# Patient Record
Sex: Female | Born: 1980 | Race: Black or African American | Marital: Married | Smoking: Never smoker
Health system: Northeastern US, Academic
[De-identification: ages and names within clinical notes are randomized; demographics above are authoritative.]

## PROBLEM LIST (undated history)

## (undated) DIAGNOSIS — D573 Sickle-cell trait: Secondary | ICD-10-CM

## (undated) DIAGNOSIS — O1205 Gestational edema, complicating the puerperium: Secondary | ICD-10-CM

## (undated) DIAGNOSIS — O09529 Supervision of elderly multigravida, unspecified trimester: Secondary | ICD-10-CM

## (undated) DIAGNOSIS — Z8759 Personal history of other complications of pregnancy, childbirth and the puerperium: Secondary | ICD-10-CM

## (undated) DIAGNOSIS — K219 Gastro-esophageal reflux disease without esophagitis: Secondary | ICD-10-CM

## (undated) DIAGNOSIS — E669 Obesity, unspecified: Secondary | ICD-10-CM

## (undated) DIAGNOSIS — J45909 Unspecified asthma, uncomplicated: Secondary | ICD-10-CM

## (undated) DIAGNOSIS — R7303 Prediabetes: Secondary | ICD-10-CM

## (undated) DIAGNOSIS — D649 Anemia, unspecified: Secondary | ICD-10-CM

## (undated) HISTORY — DX: Prediabetes: R73.03

## (undated) HISTORY — DX: Obesity, unspecified: E66.9

## (undated) HISTORY — PX: OTHER SURGICAL HISTORY: SHX169

## (undated) HISTORY — DX: Anemia, unspecified: D64.9

## (undated) HISTORY — DX: Sickle-cell trait: D57.3

## (undated) HISTORY — DX: Unspecified asthma, uncomplicated: J45.909

---

## 2014-12-15 ENCOUNTER — Other Ambulatory Visit (HOSPITAL_COMMUNITY): Payer: Self-pay | Admitting: Obstetrics

## 2014-12-15 DIAGNOSIS — N979 Female infertility, unspecified: Secondary | ICD-10-CM

## 2014-12-17 ENCOUNTER — Ambulatory Visit (HOSPITAL_COMMUNITY)
Admission: RE | Admit: 2014-12-17 | Discharge: 2014-12-17 | Disposition: A | Discharge status: Home / Self Care | Payer: BLUE CROSS/BLUE SHIELD | Source: Ambulatory Visit | Attending: Obstetrics | Admitting: Obstetrics

## 2014-12-17 DIAGNOSIS — N979 Female infertility, unspecified: Secondary | ICD-10-CM | POA: Insufficient documentation

## 2014-12-17 MED ORDER — IOHEXOL 300 MG/ML  SOLN
20.0000 mL | Freq: Once | INTRAMUSCULAR | Status: AC | PRN
  Administered 2014-12-17: 20 mL

## 2015-12-21 LAB — OB RESULTS CONSOLE GC/CHLAMYDIA
Chlamydia: NEGATIVE
GC PROBE AMP, GENITAL: NEGATIVE

## 2015-12-21 LAB — OB RESULTS CONSOLE RUBELLA ANTIBODY, IGM: RUBELLA: IMMUNE

## 2015-12-21 LAB — OB RESULTS CONSOLE RPR: RPR: NONREACTIVE

## 2015-12-21 LAB — OB RESULTS CONSOLE ABO/RH: RH TYPE: POSITIVE

## 2015-12-21 LAB — OB RESULTS CONSOLE HIV ANTIBODY (ROUTINE TESTING): HIV: NONREACTIVE

## 2015-12-21 LAB — OB RESULTS CONSOLE ANTIBODY SCREEN: ANTIBODY SCREEN: NEGATIVE

## 2015-12-21 LAB — OB RESULTS CONSOLE HEPATITIS B SURFACE ANTIGEN: HEP B S AG: NEGATIVE

## 2016-02-01 ENCOUNTER — Inpatient Hospital Stay (HOSPITAL_COMMUNITY)
Admission: AD | Admit: 2016-02-01 | Payer: BLUE CROSS/BLUE SHIELD | Source: Ambulatory Visit | Admitting: Obstetrics and Gynecology

## 2016-06-05 ENCOUNTER — Other Ambulatory Visit: Payer: Self-pay | Admitting: Obstetrics

## 2016-06-05 DIAGNOSIS — O26613 Liver and biliary tract disorders in pregnancy, third trimester: Principal | ICD-10-CM

## 2016-06-05 DIAGNOSIS — K831 Obstruction of bile duct: Secondary | ICD-10-CM

## 2016-06-06 ENCOUNTER — Encounter (HOSPITAL_COMMUNITY)
Admission: RE | Disposition: A | Discharge status: Home / Self Care | Payer: Self-pay | Source: Ambulatory Visit | Attending: Obstetrics

## 2016-06-06 ENCOUNTER — Inpatient Hospital Stay (HOSPITAL_COMMUNITY)
Admission: RE | Admit: 2016-06-06 | Discharge: 2016-06-10 | DRG: 765 | Disposition: A | Discharge status: Home / Self Care | Payer: BLUE CROSS/BLUE SHIELD | Source: Ambulatory Visit | Attending: Obstetrics | Admitting: Obstetrics

## 2016-06-06 ENCOUNTER — Inpatient Hospital Stay (HOSPITAL_COMMUNITY): Payer: BLUE CROSS/BLUE SHIELD | Admitting: Anesthesiology

## 2016-06-06 ENCOUNTER — Encounter (HOSPITAL_COMMUNITY): Payer: Self-pay

## 2016-06-06 DIAGNOSIS — O26613 Liver and biliary tract disorders in pregnancy, third trimester: Secondary | ICD-10-CM

## 2016-06-06 DIAGNOSIS — O1002 Pre-existing essential hypertension complicating childbirth: Secondary | ICD-10-CM | POA: Diagnosis present

## 2016-06-06 DIAGNOSIS — E669 Obesity, unspecified: Secondary | ICD-10-CM | POA: Diagnosis present

## 2016-06-06 DIAGNOSIS — O9081 Anemia of the puerperium: Secondary | ICD-10-CM | POA: Diagnosis not present

## 2016-06-06 DIAGNOSIS — D62 Acute posthemorrhagic anemia: Secondary | ICD-10-CM | POA: Diagnosis not present

## 2016-06-06 DIAGNOSIS — D573 Sickle-cell trait: Secondary | ICD-10-CM | POA: Diagnosis present

## 2016-06-06 DIAGNOSIS — O99214 Obesity complicating childbirth: Secondary | ICD-10-CM | POA: Diagnosis present

## 2016-06-06 DIAGNOSIS — O26643 Intrahepatic cholestasis of pregnancy, third trimester: Secondary | ICD-10-CM | POA: Diagnosis present

## 2016-06-06 DIAGNOSIS — Z6838 Body mass index (BMI) 38.0-38.9, adult: Secondary | ICD-10-CM | POA: Diagnosis not present

## 2016-06-06 DIAGNOSIS — Z3490 Encounter for supervision of normal pregnancy, unspecified, unspecified trimester: Secondary | ICD-10-CM

## 2016-06-06 DIAGNOSIS — O324XX0 Maternal care for high head at term, not applicable or unspecified: Secondary | ICD-10-CM | POA: Diagnosis present

## 2016-06-06 DIAGNOSIS — Z3A36 36 weeks gestation of pregnancy: Secondary | ICD-10-CM | POA: Diagnosis not present

## 2016-06-06 DIAGNOSIS — O1205 Gestational edema, complicating the puerperium: Secondary | ICD-10-CM | POA: Diagnosis present

## 2016-06-06 DIAGNOSIS — K831 Obstruction of bile duct: Secondary | ICD-10-CM | POA: Diagnosis present

## 2016-06-06 DIAGNOSIS — O2662 Liver and biliary tract disorders in childbirth: Secondary | ICD-10-CM | POA: Diagnosis present

## 2016-06-06 HISTORY — DX: Gestational edema, complicating the puerperium: O12.05

## 2016-06-06 HISTORY — DX: Sickle-cell trait: D57.3

## 2016-06-06 LAB — CBC
HEMATOCRIT: 29.6 % — AB (ref 36.0–46.0)
HEMOGLOBIN: 10.5 g/dL — AB (ref 12.0–15.0)
MCH: 27.3 pg (ref 26.0–34.0)
MCHC: 35.5 g/dL (ref 30.0–36.0)
MCV: 77.1 fL — AB (ref 78.0–100.0)
Platelets: 320 10*3/uL (ref 150–400)
RBC: 3.84 MIL/uL — ABNORMAL LOW (ref 3.87–5.11)
RDW: 14.4 % (ref 11.5–15.5)
WBC: 8.3 10*3/uL (ref 4.0–10.5)

## 2016-06-06 LAB — TYPE AND SCREEN
ABO/RH(D): O POS
ANTIBODY SCREEN: NEGATIVE

## 2016-06-06 LAB — ABO/RH: ABO/RH(D): O POS

## 2016-06-06 LAB — RPR: RPR Ser Ql: NONREACTIVE

## 2016-06-06 SURGERY — Surgical Case
Anesthesia: Epidural

## 2016-06-06 MED ORDER — FENTANYL 2.5 MCG/ML BUPIVACAINE 1/10 % EPIDURAL INFUSION (WH - ANES)
14.0000 mL/h | INTRAMUSCULAR | Status: DC | PRN
  Administered 2016-06-06 (×3): 14 mL/h via EPIDURAL
  Filled 2016-06-06: qty 125

## 2016-06-06 MED ORDER — FENTANYL CITRATE (PF) 100 MCG/2ML IJ SOLN
INTRAMUSCULAR | Status: AC
  Filled 2016-06-06: qty 2

## 2016-06-06 MED ORDER — LIDOCAINE HCL (PF) 1 % IJ SOLN
30.0000 mL | INTRAMUSCULAR | Status: DC | PRN
  Filled 2016-06-06: qty 30

## 2016-06-06 MED ORDER — OXYCODONE-ACETAMINOPHEN 5-325 MG PO TABS: 2.0000 | ORAL_TABLET | ORAL | Status: DC | PRN

## 2016-06-06 MED ORDER — LACTATED RINGERS IV SOLN
INTRAVENOUS | Status: DC
  Administered 2016-06-06: 07:00:00 via INTRAVENOUS
  Administered 2016-06-06: 1000 mL via INTRAVENOUS
  Administered 2016-06-06 (×2): via INTRAVENOUS

## 2016-06-06 MED ORDER — CEFAZOLIN SODIUM-DEXTROSE 2-4 GM/100ML-% IV SOLN
2.0000 g | Freq: Once | INTRAVENOUS | Status: AC
  Administered 2016-06-06: 2 g via INTRAVENOUS

## 2016-06-06 MED ORDER — OXYTOCIN 40 UNITS IN LACTATED RINGERS INFUSION - SIMPLE MED
1.0000 m[IU]/min | INTRAVENOUS | Status: DC
  Administered 2016-06-06: 2 m[IU]/min via INTRAVENOUS
  Administered 2016-06-06: 6 m[IU]/min via INTRAVENOUS
  Administered 2016-06-06: 8 m[IU]/min via INTRAVENOUS
  Administered 2016-06-06: 4 m[IU]/min via INTRAVENOUS
  Filled 2016-06-06: qty 1000

## 2016-06-06 MED ORDER — EPHEDRINE 5 MG/ML INJ: 10.0000 mg | INTRAVENOUS | Status: DC | PRN

## 2016-06-06 MED ORDER — SODIUM BICARBONATE 8.4 % IV SOLN
INTRAVENOUS | Status: DC | PRN
  Administered 2016-06-06: 10 mL via EPIDURAL

## 2016-06-06 MED ORDER — OXYTOCIN 10 UNIT/ML IJ SOLN
INTRAMUSCULAR | Status: AC
  Filled 2016-06-06: qty 4

## 2016-06-06 MED ORDER — CEFAZOLIN SODIUM-DEXTROSE 2-4 GM/100ML-% IV SOLN
INTRAVENOUS | Status: AC
  Filled 2016-06-06: qty 100

## 2016-06-06 MED ORDER — PHENYLEPHRINE 40 MCG/ML (10ML) SYRINGE FOR IV PUSH (FOR BLOOD PRESSURE SUPPORT): 80.0000 ug | PREFILLED_SYRINGE | INTRAVENOUS | Status: DC | PRN

## 2016-06-06 MED ORDER — DIPHENHYDRAMINE HCL 50 MG/ML IJ SOLN: 12.5000 mg | INTRAMUSCULAR | Status: DC | PRN

## 2016-06-06 MED ORDER — LIDOCAINE HCL (PF) 1 % IJ SOLN
INTRAMUSCULAR | Status: DC | PRN
  Administered 2016-06-06 (×2): 4 mL

## 2016-06-06 MED ORDER — OXYTOCIN 40 UNITS IN LACTATED RINGERS INFUSION - SIMPLE MED: 2.5000 [IU]/h | INTRAVENOUS | Status: DC

## 2016-06-06 MED ORDER — SCOPOLAMINE 1 MG/3DAYS TD PT72
MEDICATED_PATCH | TRANSDERMAL | Status: AC
  Filled 2016-06-06: qty 1

## 2016-06-06 MED ORDER — OXYCODONE-ACETAMINOPHEN 5-325 MG PO TABS: 1.0000 | ORAL_TABLET | ORAL | Status: DC | PRN

## 2016-06-06 MED ORDER — ACETAMINOPHEN 325 MG PO TABS: 650.0000 mg | ORAL_TABLET | ORAL | Status: DC | PRN

## 2016-06-06 MED ORDER — MORPHINE SULFATE (PF) 0.5 MG/ML IJ SOLN
INTRAMUSCULAR | Status: AC
  Filled 2016-06-06: qty 10

## 2016-06-06 MED ORDER — LACTATED RINGERS IV SOLN: 500.0000 mL | Freq: Once | INTRAVENOUS | Status: DC

## 2016-06-06 MED ORDER — MISOPROSTOL 25 MCG QUARTER TABLET
25.0000 ug | ORAL_TABLET | ORAL | Status: DC | PRN
  Administered 2016-06-06 (×2): 25 ug via VAGINAL
  Filled 2016-06-06 (×2): qty 0.25

## 2016-06-06 MED ORDER — PHENYLEPHRINE 40 MCG/ML (10ML) SYRINGE FOR IV PUSH (FOR BLOOD PRESSURE SUPPORT)
PREFILLED_SYRINGE | INTRAVENOUS | Status: AC
  Filled 2016-06-06: qty 20

## 2016-06-06 MED ORDER — ONDANSETRON HCL 4 MG/2ML IJ SOLN
INTRAMUSCULAR | Status: AC
  Filled 2016-06-06: qty 4

## 2016-06-06 MED ORDER — DEXAMETHASONE SODIUM PHOSPHATE 4 MG/ML IJ SOLN
INTRAMUSCULAR | Status: AC
  Filled 2016-06-06: qty 1

## 2016-06-06 MED ORDER — ONDANSETRON HCL 4 MG/2ML IJ SOLN
4.0000 mg | Freq: Four times a day (QID) | INTRAMUSCULAR | Status: DC | PRN
  Administered 2016-06-06: 4 mg via INTRAVENOUS
  Filled 2016-06-06: qty 2

## 2016-06-06 MED ORDER — LACTATED RINGERS IV SOLN
500.0000 mL | INTRAVENOUS | Status: DC | PRN
  Administered 2016-06-06: 250 mL via INTRAVENOUS
  Administered 2016-06-06: 500 mL via INTRAVENOUS

## 2016-06-06 MED ORDER — ZOLPIDEM TARTRATE 5 MG PO TABS: 5.0000 mg | ORAL_TABLET | Freq: Every evening | ORAL | Status: DC | PRN

## 2016-06-06 MED ORDER — OXYTOCIN BOLUS FROM INFUSION: 500.0000 mL | Freq: Once | INTRAVENOUS | Status: DC

## 2016-06-06 MED ORDER — LIDOCAINE-EPINEPHRINE (PF) 2 %-1:200000 IJ SOLN
INTRAMUSCULAR | Status: AC
  Filled 2016-06-06: qty 20

## 2016-06-06 MED ORDER — TERBUTALINE SULFATE 1 MG/ML IJ SOLN: 0.2500 mg | Freq: Once | INTRAMUSCULAR | Status: DC | PRN

## 2016-06-06 MED ORDER — SOD CITRATE-CITRIC ACID 500-334 MG/5ML PO SOLN
30.0000 mL | ORAL | Status: DC | PRN
  Filled 2016-06-06: qty 15

## 2016-06-06 MED ORDER — FENTANYL 2.5 MCG/ML BUPIVACAINE 1/10 % EPIDURAL INFUSION (WH - ANES)
INTRAMUSCULAR | Status: AC
  Filled 2016-06-06: qty 125

## 2016-06-06 SURGICAL SUPPLY — 33 items
CHLORAPREP W/TINT 26ML (MISCELLANEOUS) ×2 IMPLANT
CLAMP CORD UMBIL (MISCELLANEOUS) ×2 IMPLANT
CLOTH BEACON ORANGE TIMEOUT ST (SAFETY) ×2 IMPLANT
CONTAINER PREFILL 10% NBF 15ML (MISCELLANEOUS) IMPLANT
DRSG OPSITE POSTOP 4X10 (GAUZE/BANDAGES/DRESSINGS) ×2 IMPLANT
ELECT REM PT RETURN 9FT ADLT (ELECTROSURGICAL) ×2
ELECTRODE REM PT RTRN 9FT ADLT (ELECTROSURGICAL) ×1 IMPLANT
EXTRACTOR VACUUM M CUP 4 TUBE (SUCTIONS) ×2 IMPLANT
GLOVE BIO SURGEON STRL SZ 6.5 (GLOVE) ×2 IMPLANT
GLOVE BIOGEL PI IND STRL 7.0 (GLOVE) ×2 IMPLANT
GLOVE BIOGEL PI INDICATOR 7.0 (GLOVE) ×2
GOWN STRL REUS W/TWL LRG LVL3 (GOWN DISPOSABLE) ×4 IMPLANT
KIT ABG SYR 3ML LUER SLIP (SYRINGE) IMPLANT
NEEDLE HYPO 22GX1.5 SAFETY (NEEDLE) IMPLANT
NEEDLE HYPO 25X5/8 SAFETYGLIDE (NEEDLE) IMPLANT
NS IRRIG 1000ML POUR BTL (IV SOLUTION) ×2 IMPLANT
PACK C SECTION WH (CUSTOM PROCEDURE TRAY) ×2 IMPLANT
PAD OB MATERNITY 4.3X12.25 (PERSONAL CARE ITEMS) ×2 IMPLANT
PENCIL SMOKE EVAC W/HOLSTER (ELECTROSURGICAL) ×2 IMPLANT
SPONGE LAP 18X18 X RAY DECT (DISPOSABLE) ×4 IMPLANT
STAPLER VISISTAT 35W (STAPLE) ×2 IMPLANT
STRIP CLOSURE SKIN 1/2X4 (GAUZE/BANDAGES/DRESSINGS) IMPLANT
SUT MON AB 4-0 PS1 27 (SUTURE) ×2 IMPLANT
SUT PLAIN 0 NONE (SUTURE) IMPLANT
SUT PLAIN 2 0 XLH (SUTURE) ×2 IMPLANT
SUT VIC AB 0 CT1 36 (SUTURE) ×4 IMPLANT
SUT VIC AB 0 CTX 36 (SUTURE) ×3
SUT VIC AB 0 CTX36XBRD ANBCTRL (SUTURE) ×3 IMPLANT
SUT VIC AB 2-0 CT1 27 (SUTURE) ×1
SUT VIC AB 2-0 CT1 TAPERPNT 27 (SUTURE) ×1 IMPLANT
SYR CONTROL 10ML LL (SYRINGE) IMPLANT
TOWEL OR 17X24 6PK STRL BLUE (TOWEL DISPOSABLE) ×2 IMPLANT
TRAY FOLEY CATH SILVER 14FR (SET/KITS/TRAYS/PACK) IMPLANT

## 2016-06-06 NOTE — Progress Notes (Signed)
S: Doing well, no complaints, pain not well controlled with nitrous. Awaiting epidural  O: BP 129/82   Pulse 66   Temp 98.8 F (37.1 C) (Axillary)   Resp 18   Ht 5\' 4"  (1.626 m)   Wt 102.5 kg (226 lb)   LMP 09/24/2015 (Within Days)   BMI 38.79 kg/m    FHT:  FHR: 140s bpm, variability: moderate,  accelerations:  Present,  decelerations:  Absent UC:   Not tracign well SVE:   Dilation: 3.5 Effacement (%): 80 Station: -2 Exam by:: D Herr rn   A / P:  35 y.o.  Obstetric History   G1   P0   T0   P0   A0   L0    SAB0   TAB0   Ectopic0   Multiple0   Live Births0    at [redacted]w[redacted]d IOL due to cholestasis of pregnancy.  Continue pitocin. S/p SROM Watch bp's. Still wnl but increased edema and proteinuria noted (24 hr urine returned today with 600mg )  Fetal Wellbeing:  Category I Pain Control:  Epidural  Anticipated MOD:  NSVD  Dorothy Cisneros A. 06/06/2016, 3:58 PM

## 2016-06-06 NOTE — Anesthesia Preprocedure Evaluation (Signed)
Anesthesia Evaluation  Patient identified by MRN, date of birth, ID band Patient awake    Reviewed: Allergy & Precautions, NPO status , Patient's Chart, lab work & pertinent test results  History of Anesthesia Complications Negative for: history of anesthetic complications  Airway Mallampati: II  TM Distance: >3 FB Neck ROM: Full    Dental no notable dental hx. (+) Dental Advisory Given   Pulmonary neg pulmonary ROS,    Pulmonary exam normal breath sounds clear to auscultation       Cardiovascular negative cardio ROS Normal cardiovascular exam Rhythm:Regular Rate:Normal     Neuro/Psych negative neurological ROS  negative psych ROS   GI/Hepatic negative GI ROS, Neg liver ROS,   Endo/Other  obesity  Renal/GU negative Renal ROS  negative genitourinary   Musculoskeletal negative musculoskeletal ROS (+)   Abdominal   Peds negative pediatric ROS (+)  Hematology negative hematology ROS (+)   Anesthesia Other Findings   Reproductive/Obstetrics (+) Pregnancy                             Anesthesia Physical Anesthesia Plan  ASA: II  Anesthesia Plan: Epidural   Post-op Pain Management:    Induction:   Airway Management Planned:   Additional Equipment:   Intra-op Plan:   Post-operative Plan:   Informed Consent: I have reviewed the patients History and Physical, chart, labs and discussed the procedure including the risks, benefits and alternatives for the proposed anesthesia with the patient or authorized representative who has indicated his/her understanding and acceptance.   Dental advisory given  Plan Discussed with: CRNA  Anesthesia Plan Comments:         Anesthesia Quick Evaluation

## 2016-06-06 NOTE — Progress Notes (Signed)
S: Doing well, no complaints, pain well controlled with epidural. Pt tired, asking for c/s.  O: BP (!) 144/88   Pulse 77   Temp 98.4 F (36.9 C) (Axillary)   Resp 17   Ht 5\' 4"  (1.626 m)   Wt 102.5 kg (226 lb)   LMP 09/24/2015 (Within Days)   SpO2 99%   BMI 38.79 kg/m    FHT:  FHR: 140s bpm, variability: moderate,  accelerations:  Present,  decelerations:  Absent UC:   regular, every 3 minutes SVE:   Dilation: 10 Effacement (%): 100 Station: 0, +1 Exam by:: Folegman MD Caput/ molding +1 but vtx 0. No descent after 1 hr pushing.  A / P:  35 y.o.  Obstetric History   G1   P0   T0   P0   A0   L0    SAB0   TAB0   Ectopic0   Multiple0   Live Births0    at [redacted]w[redacted]d IOL due to cholestasis. Good progress and pt entered active labor but fetal vttx has never descended into pelvis. recc to proceed with PCS. R/B d/w pt and pt would like to move to c/s  Fetal Wellbeing:  Category I Pain Control:  Epidural  Anticipated MOD:  PCS  Leani Myron A. 06/06/2016, 11:27 PM

## 2016-06-06 NOTE — Progress Notes (Signed)
S: Doing well, no complaints, pain well controlled with epidural  O: BP (!) 119/59   Pulse 75   Temp 98 F (36.7 C)   Resp 20   Ht 5\' 4"  (1.626 m)   Wt 102.5 kg (226 lb)   LMP 09/24/2015 (Within Days)   SpO2 99%   BMI 38.79 kg/m    FHT:  FHR: 140s bpm, variability: moderate,  accelerations:  Present,  decelerations:  Present occasional early decels UC:   regular, every 3 minutes SVE:   Dilation: 4 Effacement (%): 100 Station: -2 Exam by:: dr. Pamala Hurry Caput and molding at 0 but vtx still at -2  A / P:  35 y.o.  Obstetric History   G1   P0   T0   P0   A0   L0    SAB0   TAB0   Ectopic0   Multiple0   Live Births0    at [redacted]w[redacted]d IOL due to cholestasis  Fetal Wellbeing:  Category I Pain Control:  Epidural  Anticipated MOD:  guarded, molding and caput at this early dilation and high station concerning  Delma Drone A. 06/06/2016, 6:19 PM

## 2016-06-06 NOTE — Anesthesia Procedure Notes (Addendum)
Epidural Patient location during procedure: OB  Staffing Anesthesiologist: Lauretta Grill Performed: anesthesiologist   Preanesthetic Checklist Completed: patient identified, site marked, surgical consent, pre-op evaluation, timeout performed, IV checked, risks and benefits discussed and monitors and equipment checked  Epidural Patient position: sitting Prep: ChloraPrep and site prepped and draped Patient monitoring: continuous pulse ox and blood pressure Approach: midline Location: L3-L4 Injection technique: LOR saline  Needle:  Needle type: Tuohy  Needle gauge: 17 G Needle length: 9 cm and 9 Needle insertion depth: 8 cm Catheter type: closed end flexible Catheter size: 19 Gauge Catheter at skin depth: 12 cm Test dose: negative  Assessment Events: blood not aspirated, injection not painful, no injection resistance, negative IV test and no paresthesia  Additional Notes Patient identified. Risks/Benefits/Options discussed with patient including but not limited to bleeding, infection, nerve damage, paralysis, failed block, incomplete pain control, headache, blood pressure changes, nausea, vomiting, reactions to medication both or allergic, itching and postpartum back pain. Confirmed with bedside nurse the patient's most recent platelet count. Confirmed with patient that they are not currently taking any anticoagulation, have any bleeding history or any family history of bleeding disorders. Patient expressed understanding and wished to proceed. All questions were answered. Sterile technique was used throughout the entire procedure. Please see nursing notes for vital signs. Test dose was given through epidural catheter and negative prior to continuing to dose epidural or start infusion. Warning signs of high block given to the patient including shortness of breath, tingling/numbness in hands, complete motor block, or any concerning symptoms with instructions to call for help. Patient was  given instructions on fall risk and not to get out of bed. All questions and concerns addressed with instructions to call with any issues or inadequate analgesia.    Patient moving throughout epidural. I asked if she was feeling pressure or pain and she reported pressure. I still gave more local and stopped to pause until she was ready to continue. I asked if she wanted to continue and she responded yes. The epidural was easily placed in the usual time and fashion.

## 2016-06-06 NOTE — Progress Notes (Signed)
Dr Pamala Hurry called to check on pt. Start pitocin after second cytotec

## 2016-06-06 NOTE — Progress Notes (Signed)
From 01:07 to 03:17 on June 06, 2016, pt's [place] tracing can be found under the name "Dorothy Cisneros" due to an admission error.

## 2016-06-06 NOTE — H&P (Signed)
Dorothy Cisneros is a 35 y.o. G1P0 at [redacted]w[redacted]d presenting for IOL due to cholestasis of pregnancy. Pt was seen last week for routine OB visit when she noted intense itching, keeping her up at night, not responding to meds or lotions. She also had a 14# weight gain over 2 wks and new increase to her bps. She does not have RUQ pain, vision change, HA, CP. She does note mild SOB. Labs done to evaluate for PEC were negative though she did have mild anemia and bile  Acid salts were c/w cholestasis of pregnancy.. Pt notes no contractions. Good fetal movement, No vaginal bleeding, not leaking fluid.  PNCare at [facility] since 12 wks - Dated by IVF and 8 wk u/s - h/o DM, Hgb A.1 C was 6.6 back in 2016 which came down to 5.2 prior to pregnancy. Early DS at 132, 28 wks DS of 105. No meds - SS carrier, FOB neg - anemia, mild, no meds - GBS neg - cholestasis as above - Needle stick injury at work, pt works as travelling Therapist, sports. Source pt negative for infectious diseases but this has caused significant anxiety in pt. Has tested neg for HIV and Hep C three times through pregnancy - AMA, Informaseq neg - fetal growth 8/24: 34 wks 5'13, 86%, vtx, post placenta   Prenatal Transfer Tool  Maternal Diabetes: No Genetic Screening: Normal Maternal Ultrasounds/Referrals: Normal Fetal Ultrasounds or other Referrals:  None Maternal Substance Abuse:  No Significant Maternal Medications:  None Significant Maternal Lab Results: None     OB History    Gravida Para Term Preterm AB Living   1             SAB TAB Ectopic Multiple Live Births                 Past Medical History:  Diagnosis Date  . Sickle-cell trait ([place])    History reviewed. No pertinent surgical history. Family History: family history is not on file. Social History:  reports that she has never smoked. She has never used smokeless tobacco. Her alcohol and drug histories are not on file.  Review of Systems - Negative except  itching   Dilation: 1.5 Effacement (%): 50 Station: -2 Exam by:: B. Parks, RN Blood pressure 133/78, pulse 78, temperature 98.5 F (36.9 C), temperature source Oral, resp. rate 20, height 5\' 4"  (1.626 m), weight 102.5 kg (226 lb), last menstrual period 09/24/2015.   Vitals:   06/06/16 0138 06/06/16 0157 06/06/16 0452 06/06/16 0631  BP:  137/74 120/67 133/78  Pulse:  74 67 78  Resp:    20  Temp:    98.5 F (36.9 C)  TempSrc:    Oral  Weight: 102.5 kg (226 lb)     Height: 5\' 4"  (1.626 m)       Prenatal labs: ABO, Rh: --/--/O POS (09/12 0257) Antibody: NEG (09/12 0257) Rubella: !Error! immune RPR: Nonreactive (03/28 0000)  HBsAg: Negative (03/28 0000)  HIV: Non-reactive (03/28 0000)  GBS:   neg 1 hr Glucola 105  Genetic screening nl Informaseq nl AFP Anatomy US nl female   Assessment/Plan: 35 y.o. G1P0 at [redacted]w[redacted]d Cholestasis of preg.  After reviewing risks of cholestasis, pt and husband asked for immediate IOL.  - IOL. Pt aware markedly increases risks of c/s. Plan cytotec o/n then pitocin. May consider AROM and/or cervical foley bulb pending her progress.    Oceanna Arruda A. 06/06/2016, 7:01 AM

## 2016-06-06 NOTE — Anesthesia Pain Management Evaluation Note (Signed)
  CRNA Pain Management Visit Note  Patient: Dorothy Cisneros, 35 y.o., female  "Hello I am a member of the anesthesia team at [facility]. We have an anesthesia team available at all times to provide care throughout the hospital, including epidural management and anesthesia for C-section. I don't know your plan for the delivery whether it a natural birth, water birth, IV sedation, nitrous supplementation, doula or epidural, but we want to meet your pain goals."   1.Was your pain managed to your expectations on prior hospitalizations?   No prior hospitalizations  2.What is your expectation for pain management during this hospitalization?     Epidural  3.How can we help you reach that goal?  Explaining options for labor pain control  Record the patient's initial score and the patient's pain goal.   Pain: 1  Pain Goal: 6 [facility] wants you to be able to say your pain was always managed very well.  Dorothy Cisneros 06/06/2016

## 2016-06-07 ENCOUNTER — Encounter (HOSPITAL_COMMUNITY): Payer: Self-pay

## 2016-06-07 DIAGNOSIS — K831 Obstruction of bile duct: Secondary | ICD-10-CM | POA: Diagnosis present

## 2016-06-07 DIAGNOSIS — O26613 Liver and biliary tract disorders in pregnancy, third trimester: Secondary | ICD-10-CM

## 2016-06-07 LAB — CBC
HCT: 28 % — ABNORMAL LOW (ref 36.0–46.0)
HEMOGLOBIN: 9.7 g/dL — AB (ref 12.0–15.0)
MCH: 26.9 pg (ref 26.0–34.0)
MCHC: 34.6 g/dL (ref 30.0–36.0)
MCV: 77.8 fL — ABNORMAL LOW (ref 78.0–100.0)
PLATELETS: 299 10*3/uL (ref 150–400)
RBC: 3.6 MIL/uL — AB (ref 3.87–5.11)
RDW: 14.3 % (ref 11.5–15.5)
WBC: 18.5 10*3/uL — AB (ref 4.0–10.5)

## 2016-06-07 MED ORDER — OXYCODONE-ACETAMINOPHEN 5-325 MG PO TABS: 2.0000 | ORAL_TABLET | ORAL | Status: DC | PRN

## 2016-06-07 MED ORDER — TETANUS-DIPHTH-ACELL PERTUSSIS 5-2.5-18.5 LF-MCG/0.5 IM SUSP: 0.5000 mL | Freq: Once | INTRAMUSCULAR | Status: DC

## 2016-06-07 MED ORDER — NALBUPHINE HCL 10 MG/ML IJ SOLN: 5.0000 mg | INTRAMUSCULAR | Status: DC | PRN

## 2016-06-07 MED ORDER — ACETAMINOPHEN 325 MG PO TABS: 650.0000 mg | ORAL_TABLET | ORAL | Status: DC | PRN

## 2016-06-07 MED ORDER — MORPHINE SULFATE (PF) 0.5 MG/ML IJ SOLN
INTRAMUSCULAR | Status: DC | PRN
  Administered 2016-06-06: 3 mg via EPIDURAL
  Administered 2016-06-06: 2 mg via INTRAVENOUS

## 2016-06-07 MED ORDER — MEPERIDINE HCL 25 MG/ML IJ SOLN
INTRAMUSCULAR | Status: AC
  Filled 2016-06-07: qty 1

## 2016-06-07 MED ORDER — DIBUCAINE 1 % RE OINT: 1.0000 "application " | TOPICAL_OINTMENT | RECTAL | Status: DC | PRN

## 2016-06-07 MED ORDER — IBUPROFEN 600 MG PO TABS
600.0000 mg | ORAL_TABLET | Freq: Four times a day (QID) | ORAL | Status: DC
  Administered 2016-06-07 – 2016-06-10 (×12): 600 mg via ORAL
  Filled 2016-06-07 (×14): qty 1

## 2016-06-07 MED ORDER — OXYCODONE-ACETAMINOPHEN 5-325 MG PO TABS
1.0000 | ORAL_TABLET | ORAL | Status: DC | PRN
  Administered 2016-06-08 – 2016-06-09 (×5): 1 via ORAL
  Filled 2016-06-07 (×5): qty 1

## 2016-06-07 MED ORDER — LACTATED RINGERS IV SOLN: INTRAVENOUS | Status: DC

## 2016-06-07 MED ORDER — SCOPOLAMINE 1 MG/3DAYS TD PT72
1.0000 | MEDICATED_PATCH | Freq: Once | TRANSDERMAL | Status: DC
  Filled 2016-06-07: qty 1

## 2016-06-07 MED ORDER — VITAMIN K1 1 MG/0.5ML IJ SOLN
INTRAMUSCULAR | Status: AC
  Filled 2016-06-07: qty 0.5

## 2016-06-07 MED ORDER — NALBUPHINE HCL 10 MG/ML IJ SOLN: 5.0000 mg | Freq: Once | INTRAMUSCULAR | Status: DC | PRN

## 2016-06-07 MED ORDER — LACTATED RINGERS IV SOLN
INTRAVENOUS | Status: DC | PRN
  Administered 2016-06-06 – 2016-06-07 (×2): via INTRAVENOUS

## 2016-06-07 MED ORDER — FENTANYL CITRATE (PF) 100 MCG/2ML IJ SOLN
INTRAMUSCULAR | Status: DC | PRN
  Administered 2016-06-06 – 2016-06-07 (×2): 100 ug via INTRAVENOUS

## 2016-06-07 MED ORDER — DIPHENHYDRAMINE HCL 25 MG PO CAPS: 25.0000 mg | ORAL_CAPSULE | Freq: Four times a day (QID) | ORAL | Status: DC | PRN

## 2016-06-07 MED ORDER — SCOPOLAMINE 1 MG/3DAYS TD PT72
MEDICATED_PATCH | TRANSDERMAL | Status: DC | PRN
  Administered 2016-06-07: 1 via TRANSDERMAL

## 2016-06-07 MED ORDER — MEPERIDINE HCL 25 MG/ML IJ SOLN
6.2500 mg | INTRAMUSCULAR | Status: DC | PRN
Start: 2016-06-07 — End: 2016-06-07

## 2016-06-07 MED ORDER — SENNOSIDES-DOCUSATE SODIUM 8.6-50 MG PO TABS
2.0000 | ORAL_TABLET | ORAL | Status: DC
  Administered 2016-06-08 – 2016-06-09 (×3): 2 via ORAL
  Filled 2016-06-07 (×3): qty 2

## 2016-06-07 MED ORDER — INFLUENZA VAC SPLIT QUAD 0.5 ML IM SUSY
0.5000 mL | PREFILLED_SYRINGE | INTRAMUSCULAR | Status: AC
  Administered 2016-06-08: 0.5 mL via INTRAMUSCULAR
  Filled 2016-06-07: qty 0.5

## 2016-06-07 MED ORDER — KETOROLAC TROMETHAMINE 30 MG/ML IJ SOLN
INTRAMUSCULAR | Status: AC
  Administered 2016-06-07: 30 mg via INTRAVENOUS
  Filled 2016-06-07: qty 1

## 2016-06-07 MED ORDER — HYDROMORPHONE HCL 1 MG/ML IJ SOLN
INTRAMUSCULAR | Status: AC
  Filled 2016-06-07: qty 1

## 2016-06-07 MED ORDER — DIPHENHYDRAMINE HCL 25 MG PO CAPS
25.0000 mg | ORAL_CAPSULE | ORAL | Status: DC | PRN
  Administered 2016-06-07: 25 mg via ORAL
  Filled 2016-06-07: qty 1

## 2016-06-07 MED ORDER — WITCH HAZEL-GLYCERIN EX PADS: 1.0000 "application " | MEDICATED_PAD | CUTANEOUS | Status: DC | PRN

## 2016-06-07 MED ORDER — DIPHENHYDRAMINE HCL 50 MG/ML IJ SOLN
12.5000 mg | INTRAMUSCULAR | Status: DC | PRN
Start: 2016-06-07 — End: 2016-06-09

## 2016-06-07 MED ORDER — ONDANSETRON HCL 4 MG/2ML IJ SOLN: 4.0000 mg | Freq: Three times a day (TID) | INTRAMUSCULAR | Status: DC | PRN

## 2016-06-07 MED ORDER — SIMETHICONE 80 MG PO CHEW: 80.0000 mg | CHEWABLE_TABLET | ORAL | Status: DC | PRN

## 2016-06-07 MED ORDER — SODIUM CHLORIDE 0.9% FLUSH: 3.0000 mL | INTRAVENOUS | Status: DC | PRN

## 2016-06-07 MED ORDER — ZOLPIDEM TARTRATE 5 MG PO TABS: 5.0000 mg | ORAL_TABLET | Freq: Every evening | ORAL | Status: DC | PRN

## 2016-06-07 MED ORDER — NALOXONE HCL 0.4 MG/ML IJ SOLN: 0.4000 mg | INTRAMUSCULAR | Status: DC | PRN

## 2016-06-07 MED ORDER — OXYTOCIN 40 UNITS IN LACTATED RINGERS INFUSION - SIMPLE MED: 2.5000 [IU]/h | INTRAVENOUS | Status: AC

## 2016-06-07 MED ORDER — MENTHOL 3 MG MT LOZG: 1.0000 | LOZENGE | OROMUCOSAL | Status: DC | PRN

## 2016-06-07 MED ORDER — SIMETHICONE 80 MG PO CHEW
80.0000 mg | CHEWABLE_TABLET | Freq: Three times a day (TID) | ORAL | Status: DC
  Administered 2016-06-07 – 2016-06-10 (×10): 80 mg via ORAL
  Filled 2016-06-07 (×9): qty 1

## 2016-06-07 MED ORDER — FENTANYL CITRATE (PF) 100 MCG/2ML IJ SOLN
INTRAMUSCULAR | Status: AC
  Filled 2016-06-07: qty 2

## 2016-06-07 MED ORDER — FAMOTIDINE 20 MG PO TABS
20.0000 mg | ORAL_TABLET | Freq: Two times a day (BID) | ORAL | Status: DC
  Administered 2016-06-07 – 2016-06-10 (×6): 20 mg via ORAL
  Filled 2016-06-07 (×8): qty 1

## 2016-06-07 MED ORDER — KETOROLAC TROMETHAMINE 30 MG/ML IJ SOLN
30.0000 mg | Freq: Four times a day (QID) | INTRAMUSCULAR | Status: AC | PRN
  Administered 2016-06-07: 30 mg via INTRAVENOUS

## 2016-06-07 MED ORDER — ERYTHROMYCIN 5 MG/GM OP OINT
TOPICAL_OINTMENT | OPHTHALMIC | Status: AC
  Filled 2016-06-07: qty 1

## 2016-06-07 MED ORDER — DEXAMETHASONE SODIUM PHOSPHATE 4 MG/ML IJ SOLN
INTRAMUSCULAR | Status: DC | PRN
  Administered 2016-06-07: 4 mg via INTRAVENOUS

## 2016-06-07 MED ORDER — NALOXONE HCL 2 MG/2ML IJ SOSY
1.0000 ug/kg/h | PREFILLED_SYRINGE | INTRAVENOUS | Status: DC | PRN
  Filled 2016-06-07: qty 2

## 2016-06-07 MED ORDER — ONDANSETRON HCL 4 MG/2ML IJ SOLN
INTRAMUSCULAR | Status: DC | PRN
  Administered 2016-06-07: 4 mg via INTRAVENOUS

## 2016-06-07 MED ORDER — MEPERIDINE HCL 25 MG/ML IJ SOLN
INTRAMUSCULAR | Status: DC | PRN
  Administered 2016-06-07 (×2): 12.5 mg via INTRAVENOUS

## 2016-06-07 MED ORDER — SIMETHICONE 80 MG PO CHEW
80.0000 mg | CHEWABLE_TABLET | ORAL | Status: DC
  Administered 2016-06-08: 80 mg via ORAL
  Filled 2016-06-07 (×3): qty 1

## 2016-06-07 MED ORDER — COCONUT OIL OIL
1.0000 | TOPICAL_OIL | Status: DC | PRN
Start: 2016-06-07 — End: 2016-06-10

## 2016-06-07 MED ORDER — OXYTOCIN 10 UNIT/ML IJ SOLN
INTRAMUSCULAR | Status: DC | PRN
  Administered 2016-06-07: 40 [IU] via INTRAVENOUS

## 2016-06-07 MED ORDER — PRENATAL MULTIVITAMIN CH
1.0000 | ORAL_TABLET | Freq: Every day | ORAL | Status: DC
  Administered 2016-06-08 – 2016-06-10 (×3): 1 via ORAL
  Filled 2016-06-07 (×3): qty 1

## 2016-06-07 MED ORDER — HYDROMORPHONE HCL 1 MG/ML IJ SOLN
INTRAMUSCULAR | Status: DC | PRN
  Administered 2016-06-07: 1 mg via INTRAVENOUS

## 2016-06-07 MED ORDER — FENTANYL CITRATE (PF) 100 MCG/2ML IJ SOLN: 25.0000 ug | INTRAMUSCULAR | Status: DC | PRN

## 2016-06-07 MED ORDER — KETOROLAC TROMETHAMINE 30 MG/ML IJ SOLN: 30.0000 mg | Freq: Four times a day (QID) | INTRAMUSCULAR | Status: AC | PRN

## 2016-06-07 NOTE — Consult Note (Signed)
Neonatology Note:   Attendance at C-section:    I was asked by Dr. Pamala Hurry to attend this C/S at 36 6/7 week for failure to descend. The mother is a G1, GBS neg with good prenatal care. ROM at 0900 on 9/12, ~15 hours before delivery, fluid clear.  Deep extraction; required brief vacuum assist. Infant vigorous with good spontaneous cry and tone. Needed only minimal bulb suctioning. Ap 8/9. Lungs clear to ausc in DR. To CN to care of Pediatrician.  Monia Sabal Katherina Mires, MD

## 2016-06-07 NOTE — Progress Notes (Signed)
Patient ID: Dorothy Cisneros, female   DOB: [birth year], 35 y.o.   MRN: [id number]  9 hrs pos-op, S/P C/S, FTD at 10cm, IOL 2/2 ICP, elevated BP  INTERVAL NOTE:  S:  Sitting in bed, BFing, min cramping, foley cath in place, IV discontinued 2/2 infiltration this am, minimal swelling in L arm, small bleed, denies HA/NV/dizziness. Ambulate to BR for pericare. Itching persisiting  O:  VSS, AAO x 3, NAD  FF bellow U  Scant lochia  Incision D/C/I, honeycomb dressing in place over staples  Mild edema LE's, SCD's applied  A / P:   POD #0  Stable post - op  Routine orders  Benadryl and Nubain PRN for itching  Katasha Riga, CNM, MSN  06/07/2016 10:27 AM

## 2016-06-07 NOTE — Lactation Note (Signed)
This note was copied from a baby's chart. Lactation Consultation Note  Patient Name: Dorothy Cisneros [id number] Date: 06/07/2016 Reason for consult: Initial assessment;Other (Comment) (early term 37.0 weeks) Mom's nipple/aerola thick tough with edema. Demonstrated using hand pump to pre-pump to soften the nipple/aerola. Assisted Mom with positioning and using breast compression to latch baby and obtain more depth. Started in football hold but baby becomes shallow and falls asleep. Changed to cross cradle and baby was able to develop a more nutritive suckling pattern with some swallowing motions noted. Encouraged Mom to BF with feeding ques. Discussed LPT policy and behaviors. Advised Mom she may need to wake baby to BF if sleepy. Set up DEBP but Mom too sleepy to initiate, Mom recently medicated. RN advised to review pumping with Mom and to encourage to pump for 15 minutes followed by 5 minutes of hand expression every 3 hours to encourage milk production, give baby back any EBM she receives. Supplemental guidelines discussed with Mom but will need further review.  Colostrum present with hand expression at this visit. Discussed supplementing with feedings to support baby's energy and calorie needs. Mom will need f/u to discuss/demonstrate ways to supplement without using a bottle if possible. Mom has pacifier in crib. Discussed risk of pacifier use this early. Lactation brochure left for review, advised of OP services and support group. Encouraged to call for assist with feedings.   Maternal Data Has patient been taught Hand Expression?: Yes Does the patient have breastfeeding experience prior to this delivery?: No  Feeding Feeding Type: Breast Fed Length of feed: 15 min (on/off)  LATCH Score/Interventions Latch: Repeated attempts needed to sustain latch, nipple held in mouth throughout feeding, stimulation needed to elicit sucking reflex. Intervention(s): Adjust position;Assist with latch;Breast  massage;Breast compression  Audible Swallowing: A few with stimulation  Type of Nipple: Everted at rest and after stimulation  Comfort (Breast/Nipple): Soft / non-tender     Hold (Positioning): Assistance needed to correctly position infant at breast and maintain latch. Intervention(s): Breastfeeding basics reviewed;Support Pillows;Position options;Skin to skin  LATCH Score: 7  Lactation Tools Discussed/Used Tools: Pump;Flanges Flange Size: 30 Breast pump type: Double-Electric Breast Pump WIC Program: No Date initiated:: 06/07/16   Consult Status Consult Status: Follow-up Date: 06/08/16 Follow-up type: In-patient    Katrine Coho 06/07/2016, 11:37 AM

## 2016-06-07 NOTE — Anesthesia Postprocedure Evaluation (Signed)
Anesthesia Post Note  Patient: Lonni Fix  Procedure(s) Performed: Procedure(s) (LRB): CESAREAN SECTION (N/A)  Patient location during evaluation: Mother Baby Anesthesia Type: Epidural Level of consciousness: awake and alert Pain management: satisfactory to patient Vital Signs Assessment: post-procedure vital signs reviewed and stable Respiratory status: respiratory function stable Cardiovascular status: stable Postop Assessment: no headache, no backache, epidural receding, patient able to bend at knees, no signs of nausea or vomiting and adequate PO intake Anesthetic complications: no     Last Vitals:  Vitals:   06/07/16 1720 06/07/16 2049  BP: 133/84 138/75  Pulse: 75 85  Resp: 18 18  Temp: 36.3 C 36.9 C    Last Pain:  Vitals:   06/07/16 2049  TempSrc: Oral  PainSc: 2    Pain Goal: Patients Stated Pain Goal: 2 (06/07/16 2049)               Katherina Mires

## 2016-06-07 NOTE — Op Note (Signed)
06/06/2016 - 06/07/2016  1:33 AM  PATIENT:  Dorothy Cisneros  35 y.o. female  PRE-OPERATIVE DIAGNOSIS:  primary cesarean section for arrest of descent  POST-OPERATIVE DIAGNOSIS:  primary cesarean section for arrest of descent  PROCEDURE:  Procedure(s): CESAREAN SECTION (N/A)  Low-transverse with 2 layer closure  SURGEON:  Surgeon(s) and Role:    * Aloha Gell, MD - Primary  PHYSICIAN ASSISTANT:   ASSISTANTS: Renato Battles, CNM Darron Doom, MD as intraoperative consult   ANESTHESIA:   epidural  EBL:  Total I/O In: 2400 [I.V.:2400] Out: 850 [Urine:150; Blood:700]  BLOOD ADMINISTERED:none  DRAINS: Urinary Catheter (Foley)   LOCAL MEDICATIONS USED:  none  SPECIMEN:  Source of Specimen:  Placenta  DISPOSITION OF SPECIMEN:  Source of Specimen:  Placenta to labor and delivery  COUNTS:  YES  TOURNIQUET:  * No tourniquets in log *  DICTATION: .Note written in EPIC  PLAN OF CARE: Admit to inpatient   PATIENT DISPOSITION:  PACU - hemodynamically stable.   Delay start of Pharmacological VTE agent (>24hrs) due to surgical blood loss or risk of bleeding: yes    Findings:  @BABYSEXEBC @ infant,  APGAR (1 MIN): 8   APGAR (5 MINS): 9   APGAR (10 MINS):   Normal uterus, tubes and ovaries, normal placenta. 3VC, clear amniotic fluid 4 x 4 centimeter pedunculated fibroid with broad based attachment at the left cornual just at the level of the fallopian tube  EBL: 700 cc Antibiotics:   2g Ancef Complications: none  Indications: This is a 35 y.o. year-old, G1  [name] admitted for induction of labor due to cholestasis of pregnancy. She had elevated bile acids. She also had marked increase in edema with a 14 pound weight gain in 2 weeks and proteinuria with 600 mg on a 24-hour urine.. Patient had Cytotec ripening and Pitocin induction of labor with essentially achieving active labor and dilating to 10 cm. Despite this she never had significant descent and fetal station. Once fully  dilated she was allowed 1 hour of pushing which only increased N molding but fetal vertex did not pass the 0 station. At that point discussion was had with the patient regarding C-section versus continued pushing and patient opted for C-section given clinical concern for no change in fetal station . Risks benefits and alternatives of the procedure were discussed with the patient who agreed to proceed  Procedure:  After informed consent was obtained the patient was taken to the operating room where epidural anesthesia was found to be adequate.  She was prepped and draped in the normal sterile fashion in dorsal supine position with a leftward tilt.  A foley catheter was in place.  A Pfannenstiel skin incision was made 2 cm above the pubic symphysis in the midline with the scalpel.  Dissection was carried down with the Bovie cautery until the fascia was reached. The fascia was incised in the midline. The incision was extended laterally with the Mayo scissors. The inferior aspect of the fascial incision was grasped with the Coker clamps, elevated up and the underlying rectus muscles were dissected off sharply. The superior aspect of the fascial incision was grasped with the Coker clamps elevated up and the underlying rectus muscles were dissected off sharply.  The peritoneum was entered sharply. Significant peritoneal fluid was noted .The peritoneal incision was extended superiorly and inferiorly with good visualization of the bladder. The bladder blade was inserted and palpation was done to assess the fetal position and the location of the uterine  vessels. The fetal vertex was felt to be wedged deep in a narrow pelvis. A bladder flap was created sharply . The labor nurse was asked to place a hand into the vagina to rotate and elevate the fetal vertex. Once this was done, the lower segment of the uterus was incised sharply with the scalpel and extended sharply with bandage scissors. Additional extension was done   bluntly in the cephalo-caudal fashion. The infants  vertex was wedged deep in the pelvis. Despite the labor nurse attempting to elevate the head no initial movement of the fetal vertex was noted. It took about 5 minutes with manipulation of the fetal vertex to eventually dislodge the vertex from the pelvis. During this time continued Bertell Maria.D. was called as an intraoperative consult. Once the head was dislodged from the narrow pelvis with fundal pressure the head easily delivered followed by shoulders and body. Spontaneous cry was noted. The cord was clamped and cut after 1 minute of delay .  The infant was handed off to the waiting pediatrician. The placenta was expressed. The uterus was exteriorized. The uterus was cleared of all clots and debris.  and inferior left extension was noted and this was repaired primarily . The uterine incision was repaired with 0 Vicryl in a running locked fashion.  A second layer of the same suture was used in an imbricating fashion to obtain excellent hemostasis. The left fundal pedunculated fibroid was evaluated and patient was further questioned about this. Patient was unaware of the fibroid had no symptoms of the fibroid. Given it was close to the fallopian tube the decision was made to leave it in situ.. The uterus was then returned to the abdomen, the gutters were cleared of all clots and debris. The uterine incision was reinspected and found to be hemostatic. The peritoneum was grasped and closed with 2-0 Vicryl in a running fashion. The cut muscle edges and the underside of the fascia were inspected and found to be hemostatic. The fascia was closed with 0 Vicryl in two halves . The subcutaneous tissue was irrigated. Scarpa's layer was closed with a 2-0 plain gut suture. The skin was closed with staples. The patient tolerated the procedure well. Sponge lap and needle counts were correct x3 and patient was taken to the recovery room in a stable condition.  [name]  A. 06/07/2016 1:35 AM

## 2016-06-07 NOTE — Transfer of Care (Addendum)
Immediate Anesthesia Transfer of Care Note  Patient: Dorothy Cisneros  Procedure(s) Performed: Procedure(s): CESAREAN SECTION (N/A)  Patient Location: PACU  Anesthesia Type:Epidural  Level of Consciousness: awake, alert  and oriented  Airway & Oxygen Therapy: Patient Spontanous Breathing  Post-op Assessment: Report given to RN and Post -op Vital signs reviewed and stable  Post vital signs: Reviewed and stable  Last Vitals:  Vitals:   06/07/16 0230 06/07/16 0330  BP: 134/80 138/69  Pulse: 86 70  Resp: 18 20  Temp:  36.7 C    Last Pain:  Vitals:   06/07/16 0330  TempSrc: Oral  PainSc: 0-No pain      Patients Stated Pain Goal: 2 ([id number])  Complications: No apparent anesthesia complications

## 2016-06-07 NOTE — Brief Op Note (Signed)
06/06/2016 - 06/07/2016  1:33 AM  PATIENT:  Dorothy Cisneros  35 y.o. female  PRE-OPERATIVE DIAGNOSIS:  primary cesarean section for arrest of descent  POST-OPERATIVE DIAGNOSIS:  primary cesarean section for arrest of descent  PROCEDURE:  Procedure(s): CESAREAN SECTION (N/A)  Low-transverse with 2 layer closure  SURGEON:  Surgeon(s) and Role:    * Aloha Gell, MD - Primary  PHYSICIAN ASSISTANT:   ASSISTANTS: Renato Battles, CNM Darron Doom, MD as intraoperative consult   ANESTHESIA:   epidural  EBL:  Total I/O In: 2400 [I.V.:2400] Out: 850 [Urine:150; Blood:700]  BLOOD ADMINISTERED:none  DRAINS: Urinary Catheter (Foley)   LOCAL MEDICATIONS USED:  none  SPECIMEN:  Source of Specimen:  Placenta  DISPOSITION OF SPECIMEN:  Source of Specimen:  Placenta to labor and delivery  COUNTS:  YES  TOURNIQUET:  * No tourniquets in log *  DICTATION: .Note written in EPIC  PLAN OF CARE: Admit to inpatient   PATIENT DISPOSITION:  PACU - hemodynamically stable.   Delay start of Pharmacological VTE agent (>24hrs) due to surgical blood loss or risk of bleeding: yes

## 2016-06-07 NOTE — Anesthesia Postprocedure Evaluation (Signed)
Anesthesia Post Note  Patient: Dorothy Cisneros  Procedure(s) Performed: Procedure(s) (LRB): CESAREAN SECTION (N/A)  Patient location during evaluation: Mother Baby Anesthesia Type: Epidural Level of consciousness: awake Pain management: satisfactory to patient Vital Signs Assessment: post-procedure vital signs reviewed and stable Respiratory status: spontaneous breathing Cardiovascular status: stable Anesthetic complications: no     Last Vitals:  Vitals:   06/07/16 0430 06/07/16 0900  BP: (!) 141/67 123/89  Pulse: 83 88  Resp: 18 18  Temp: 36.9 C 36.9 C    Last Pain:  Vitals:   06/07/16 0900  TempSrc: Oral  PainSc:    Pain Goal: Patients Stated Pain Goal: 2 (06/07/16 0230)               Casimer Lanius

## 2016-06-08 ENCOUNTER — Encounter (HOSPITAL_COMMUNITY): Payer: Self-pay

## 2016-06-08 DIAGNOSIS — O1205 Gestational edema, complicating the puerperium: Secondary | ICD-10-CM | POA: Diagnosis present

## 2016-06-08 HISTORY — DX: Gestational edema, complicating the puerperium: O12.05

## 2016-06-08 MED ORDER — HYDROCHLOROTHIAZIDE 25 MG PO TABS
25.0000 mg | ORAL_TABLET | Freq: Every day | ORAL | Status: DC
  Administered 2016-06-08 – 2016-06-10 (×3): 25 mg via ORAL
  Filled 2016-06-08 (×3): qty 1

## 2016-06-08 NOTE — Progress Notes (Signed)
Patient ID: Dorothy Cisneros, female   DOB: 10-07-1980, 35 y.o.   MRN: [id number] Subjective: S/P Primary Cesarean Delivery d/t Arrest of Descent POD# 1 Information for the patient's newborn:  Lamekia, Leppek [id number]  female    Reports feeling more sore today than yesterday. Feeding: breast Patient reports tolerating PO.  Breast symptoms: none Pain controlled with ibuprofen (OTC) and narcotic analgesics including Percocet Denies HA/SOB/C/P/N/V/dizziness. Flatus present. No BM. She reports vaginal bleeding as normal, without clots.  She is ambulating, urinating without difficult.     Objective:   VS:  Vitals:   06/07/16 0900 06/07/16 1720 06/07/16 2049 06/08/16 0540  BP: 123/89 133/84 138/75 125/76  Pulse: 88 75 85 92  Resp: 18 18 18 18   Temp: 98.4 F (36.9 C) 97.4 F (36.3 C) 98.5 F (36.9 C) 98.2 F (36.8 C)  TempSrc: Oral Oral Oral   SpO2: 99%     Weight:      Height:         Intake/Output Summary (Last 24 hours) at 06/08/16 0847 Last data filed at 06/07/16 2146  Gross per 24 hour  Intake              850 ml  Output             1775 ml  Net             -925 ml        Recent Labs  06/06/16 0257 06/07/16 0504  WBC 8.3 18.5*  HGB 10.5* 9.7*  HCT 29.6* 28.0*  PLT 320 299     Blood type: O POS (09/12 0257)  Rubella: Immune (03/28 0000)     Physical Exam:   General: alert, cooperative, fatigued and no distress  CV: Regular rate and rhythm, S1S2 present or without murmur or extra heart sounds  Resp: clear  Abdomen: soft, nontender, normal bowel sounds  Incision: clean, dry, intact and skin well-approximated with staples  Uterine Fundus: firm, 1 FB below umbilicus, nontender  Lochia: minimal  Ext: edema 2+ - tight edema all the way up to thighs and Homans sign is negative, no sign of DVT   Assessment/Plan: 35 y.o.   POD# 1.  S/P Cesarean Delivery.  Indications: arrest of descent                Principal Problem:   Postpartum care following cesarean  delivery (9/13) Active Problems:   Pregnancy   Cholestasis of pregnancy in third trimester  Doing well, stable.               Regular diet as tolerated D/C foley per unit protocol Ambulate in hallway 2-3x/day Encouraged to increase H2O intake Routine post-op care  Graceann Congress, MSN, CNM 06/08/2016, 8:47 AM

## 2016-06-08 NOTE — Lactation Note (Signed)
This note was copied from a baby's chart. Lactation Consultation Note  Patient Name: Dorothy Cisneros [id number] Date: 06/08/2016 Reason for consult: Follow-up assessment 37+0 Baby at 39 hr of life with 6 wets, 4 stools, and 5% wt loss. Parents report baby is going to breast more and staying on longer today. Mom is AMA with T2DM, baby was a product of IVF. Mom reports bilateral nipple soreness, no skin break down noted, give shells. Assisted mom with getting a deeper latch in the football position, few swallows were noted. Discussed the possibility of supplementing. Parents would like to keep baby at the breast and see how she does over night. Mom will offer the breast on demand 8+/24hr, manually express/spoon feed after bf, and pump at least 5x/24hr. Parents are aware of lactation services and support group. They will call as needed.   Maternal Data    Feeding Feeding Type: Breast Fed Length of feed: 10 min  LATCH Score/Interventions Latch: Grasps breast easily, tongue down, lips flanged, rhythmical sucking. Intervention(s): Adjust position;Assist with latch;Breast massage;Breast compression  Audible Swallowing: A few with stimulation Intervention(s): Hand expression;Skin to skin Intervention(s): Alternate breast massage  Type of Nipple: Everted at rest and after stimulation  Comfort (Breast/Nipple): Filling, red/small blisters or bruises, mild/mod discomfort  Problem noted: Mild/Moderate discomfort Interventions (Mild/moderate discomfort): Hand expression (shells)  Hold (Positioning): Assistance needed to correctly position infant at breast and maintain latch. Intervention(s): Support Pillows;Position options  LATCH Score: 7  Lactation Tools Discussed/Used     Consult Status Consult Status: Follow-up Date: 06/09/16 Follow-up type: In-patient    Denzil Hughes 06/08/2016, 4:04 PM

## 2016-06-09 NOTE — Progress Notes (Addendum)
POSTOPERATIVE DAY # 2 S/P CS   S:         Reports feeling sore with some sharpness right side of abdomen             Tolerating po intake / no nausea / no vomiting / + flatus / no BM             Bleeding is light             Pain controlled with motrin and oxycodone             Up ad lib / ambulatory/ voiding QS  Newborn breast feeding    O:  VS: BP 128/89 (BP Location: Left Arm)   Pulse 77   Temp 98.3 F (36.8 C) (Oral)   Resp 18   Ht 5\' 4"  (1.626 m)   Wt 102.5 kg (226 lb)   LMP 09/24/2015 (Within Days)   SpO2 99%   Breastfeeding? Unknown   BMI 38.79 kg/m    LABS:               Recent Labs  06/07/16 0504  WBC 18.5*  HGB 9.7*  PLT 299               Bloodtype: --/--/O POS, O POS (09/12 0257)  Rubella: Immune (03/28 0000)                 Tdap - 2017                            Physical Exam:             Alert and Oriented X3  Lungs: Clear and unlabored  Heart: regular rate and rhythm / no mumurs  Abdomen: soft, non-tender, non-distended, active BS             Fundus: firm, non-tender, Ueven             Dressing intact              Incision:   no erythema / no ecchymosis / no drainage  Perineum: intact  Lochia: light  Extremities: 3+ edema, no calf pain or tenderness, negative Homans  A:        POD # 2 S/P CS            cholestsasis of pregnancy - delivered            Dependent edema  P:        Routine postoperative care              check CMP - continue HCTZ for dependent edema     Artelia Laroche CNM, MSN, [facility] 06/09/2016, 11:41 AM

## 2016-06-09 NOTE — Lactation Note (Signed)
This note was copied from a baby's chart. Lactation Consultation Note Follow up visit at 60 hours of age.  Mom reports giving baby 36 mls of EBM with finger feeding/ 5 fr. Catheter.  [place] praised mom to efforts with increasing volume of supplement.  Lc encouraged mom to work on supplementation at the breast so baby can latch and empty the breast with additional supplement.  Parents report being told about placing tubing at the breast with latch and LC encouraged to try with next feeding.  LC encouraged mom to wear nursing bra and use nursing pads as needed.  Parents to call for assist as needed.  Report given to [facility] RN.     Patient Name: Dorothy Cisneros [id number] Date: 06/09/2016 Reason for consult: Follow-up assessment   Maternal Data    Feeding Feeding Type: Breast Fed Length of feed: 10 min  LATCH Score/Interventions                      Lactation Tools Discussed/Used Tools: 70F feeding tube / Syringe   Consult Status Consult Status: Follow-up Date: 06/10/16 Follow-up type: In-patient    Justice Britain 06/09/2016, 10:42 PM

## 2016-06-09 NOTE — Lactation Note (Signed)
This note was copied from a baby's chart. Lactation Consultation Note Follow up visit at 9 hours of age. Mom was sleeping and awakened when [facility] entered room, offered to return later and mom said it was ok to visit now.  Baby asleep in crib last feeding 2 hours ago.  Baby has 6 feedings including supplement with 2  Of the 6 feedings in the past 24 hours with 2 voids and 1 stool.  Mom reports baby spit up some with 71mls EBM supplemented at last feeding.  Mom plans to space feedings out more so baby tolerates better.  LC advised for mom to stop feeding for burping and work up to 30-40 mls with feedings.  Lc encouraged mom to wake baby every 2 1/2-3 hours for feedings.  FOB not here at this time, but will return in 30 minutes. LC offered to assist with feeding and mom declines wanting to wait a little while, baby remains asleep.  LC discussed more frequent feedings and pumping to avoid formula and may need to add formula for larger volume feedings.  MOm to call for assist as  Needed and if she has difficulty getting larger volume supplement to baby.      Patient Name: Dorothy Cisneros [id number] Date: 06/09/2016 Reason for consult: Follow-up assessment   Maternal Data    Feeding Feeding Type: Breast Fed Length of feed: 10 min  LATCH Score/Interventions                      Lactation Tools Discussed/Used Tools: 27F feeding tube / Syringe   Consult Status      [place], Justine Null 06/09/2016, 9:06 PM

## 2016-06-09 NOTE — Lactation Note (Signed)
This note was copied from a baby's chart. Lactation Consultation Note  Patient Name: Dorothy Cisneros [id number] Date: 06/09/2016 Reason for consult: Follow-up assessment;Other (Comment) (early term baby) Baby finishing feeding at breast, demonstrating some good suckling bursts. Mom pumping right breast while baby nursing on left breast. Mom received 17 ml of colostrum but baby sleepy so did not give this to baby with this feeding. Mom reports baby had been to the breast off/on for 50 minutes. Baby has been sleepy this am, BF around 0600 then had attempt at 10:00 then this feeding at 1300. Advised Mom baby needs to BF every 3 hours due to early gestation. Discussed feeding plan of BF up to 30 minutes, supplement at breast with 5 fr feeding tube/syringe, then Mom can post pump to have EBM to supplement with next feeding. Encouraged to supplement with 20 ml each feeding. Baby at 8% weight loss and LC notes some jaundice.  Advised Mom this will conserve baby's energy with feedings and get more milk volume for baby without burning calories. Baby had large void during the feeding but concentrated. Mom agreeable to this plan. Mom will call with next feeding for LC to assist with either supplementing at breast or cup feeding.   Maternal Data    Feeding Feeding Type: Breast Fed Length of feed: 50 min (off/on)  LATCH Score/Interventions                      Lactation Tools Discussed/Used Tools: Pump;Flanges Flange Size: 30 Breast pump type: Double-Electric Breast Pump   Consult Status Consult Status: Follow-up Date: 06/09/16 Follow-up type: In-patient    Katrine Coho 06/09/2016, 2:31 PM

## 2016-06-10 MED ORDER — IBUPROFEN 600 MG PO TABS: 600.0000 mg | ORAL_TABLET | Freq: Four times a day (QID) | ORAL | 0 refills | Status: DC

## 2016-06-10 MED ORDER — OXYCODONE-ACETAMINOPHEN 5-325 MG PO TABS: 1.0000 | ORAL_TABLET | ORAL | 0 refills | Status: DC | PRN

## 2016-06-10 MED ORDER — HYDROCHLOROTHIAZIDE 25 MG PO TABS: 25.0000 mg | ORAL_TABLET | Freq: Every day | ORAL | 0 refills | Status: DC

## 2016-06-10 NOTE — Lactation Note (Signed)
This note was copied from a baby's chart. Lactation Consultation Note: 2 week [employer] rental completed. No questions at present. To call prn  Patient Name: Dorothy Cisneros [id number] Date: 06/10/2016 Reason for consult: Follow-up assessment   Maternal Data Formula Feeding for Exclusion: No Has patient been taught Hand Expression?: Yes Does the patient have breastfeeding experience prior to this delivery?: No  Feeding Feeding Type: Breast Fed Length of feed: 5 min  LATCH Score/Interventions Latch:  (instructed mother to call for next latch)                    Lactation Tools Discussed/Used Tools: 73F feeding tube / Syringe WIC Program: No   Consult Status Consult Status: Complete    Truddie Crumble 06/10/2016, 12:06 PM

## 2016-06-10 NOTE — Discharge Summary (Signed)
OB Discharge Summary  Patient Name: Dorothy Cisneros DOB: [birth year] MRN: [id number]  Date of admission: 06/06/2016  Admitting diagnosis: INDUCTION -cholestasis Intrauterine pregnancy: [redacted]w[redacted]d    Secondary diagnosis: IDA of pregnancy / labile hypertension   Date of discharge: 06/10/2016     Discharge diagnosis: Term Pregnancy Delivered / POD 3  Cesarean section - arrest of descent / dependent edema / labile HTN / IDA with compounded ABL anemia   Prenatal history: G1P1001   EDC : 06/28/2016, by Patient Reported  Prenatal care at [facility]  Primary provider : Pamala Hurry Prenatal course complicated by AMA / [place] trait / IVF pregnancy / glucose intolerance outside pregnancy / cholestasis  Prenatal Labs: ABO, Rh: --/--/O POS, O POS (09/12 0257)  Antibody: NEG (09/12 0257) Rubella: Immune (03/28 0000)   RPR: Non Reactive (09/12 0257)  HBsAg: Negative (03/28 0000)  HIV: Non-reactive (03/28 0000)  GBS:     Negative            tdap 2017                     Hospital course:  Induction of Labor With Cesarean Section  35 y.o. yo G1P1001 at [redacted]w[redacted]d was admitted to the hospital 06/06/2016 for induction of labor. Patient had a labor course significant for arrest of descent. The patient went for cesarean section due to Arrest of Descent, and delivered a Viable infant,@BABYSUPPRESS (DBLINK,ept,110,,1,,) Membrane Rupture Time/Date: )9:00 AM ,06/06/2016   @Details  of operation can be found in separate operative Note.  Patient had an uncomplicated postpartum course. She is ambulating, tolerating a regular diet, passing flatus, and urinating well.  Patient is discharged home in stable condition on 06/10/16.                                    Augmentation: Pitocin Delivering PROVIDER: Aloha Gell                                                            Complications: None  Newborn Data: Live born female  Birth Weight: 7 lb 5.8 oz (3340 g) APGAR: 8, 9  Baby Feeding:  Breast Disposition:home with mother  Post partum procedures:none  Postpartum contraception: Not Discussed    Labs: Lab Results  Component Value Date   WBC 18.5 (H) 06/07/2016   HGB 9.7 (L) 06/07/2016   HCT 28.0 (L) 06/07/2016   MCV 77.8 (L) 06/07/2016   PLT 299 06/07/2016   No flowsheet data found.  Physical Exam @ time of discharge:  Vitals:   06/08/16 1700 06/09/16 0601 06/09/16 1855 06/10/16 0627  BP: 136/78 128/89 137/79 138/89  Pulse: 84 77 91 95  Resp: 18 18 20 18   Temp: 98.9 F (37.2 C) 98.3 F (36.8 C) 98.4 F (36.9 C) 97.8 F (36.6 C)  TempSrc: Oral Oral Oral Oral  SpO2:    100%  Weight:      Height:        General: alert, cooperative and no distress Lochia: appropriate Uterine Fundus: firm Perineum: intact Incision: Healing well with no significant drainage Extremities: DVT Evaluation: No evidence of DVT seen on physical exam. Calf/Ankle edema is present  Discharge instructions:  "Baby and Me Booklet" and [place]  Discharge Medications:    Medication List    TAKE these medications   famotidine 20 MG tablet Commonly known as:  PEPCID Take 20 mg by mouth 2 (two) times daily.   FERRALET 90 90-1 MG Tabs Take 1 tablet by mouth daily.   fluticasone 50 MCG/ACT nasal spray Commonly known as:  FLONASE Place 2 sprays into both nostrils daily.   hydrochlorothiazide 25 MG tablet Commonly known as:  HYDRODIURIL Take 1 tablet (25 mg total) by mouth daily. Start taking on:  06/11/2016   ibuprofen 600 MG tablet Commonly known as:  ADVIL,MOTRIN Take 1 tablet (600 mg total) by mouth every 6 (six) hours.   multivitamin-prenatal 27-0.8 MG Tabs tablet Take 1 tablet by mouth daily at 12 noon.   oxyCODONE-acetaminophen 5-325 MG tablet Commonly known as:  PERCOCET/ROXICET Take 1 tablet by mouth every 4 (four) hours as needed (pain scale 4-7).       Diet: low salt diet  Activity: Advance as tolerated. Pelvic rest x 6 weeks.   Follow  up: Monday for BP check / staple removal    Signed: Artelia Laroche CNM, MSN, [facility] 06/10/2016, 10:04 AM

## 2016-06-10 NOTE — Lactation Note (Signed)
This note was copied from a baby's chart. Lactation Consultation Note: Baby now 54 hours old, born at [address]. Mom reports baby just finished feeding. Reports she is latching better and mom is using 5 Fr feeding tube/syringe to supplement while baby is at the breast. Reports that is working well for her. Mom pumping and obtaining 30-40 ml transitional milk at this time. Wants 2 week Symphony rental from [place] at DC. Will wait and make sure she is going home today to do pump rental. New syringe and feeding tubes given for mom to take home. No questions at present. Reviewed our phone number to call with questions, OP appointments and BFSG as resources for support after DC. To call prn  Patient Name: Dorothy Cisneros [id number] Date: 06/10/2016 Reason for consult: Follow-up assessment   Maternal Data Formula Feeding for Exclusion: No Has patient been taught Hand Expression?: Yes Does the patient have breastfeeding experience prior to this delivery?: No  Feeding Feeding Type: Breast Fed Length of feed: 5 min  LATCH Score/Interventions                      Lactation Tools Discussed/Used Tools: 74F feeding tube / Syringe WIC Program: No   Consult Status Consult Status: Complete    Truddie Crumble 06/10/2016, 8:56 AM

## 2016-06-10 NOTE — Progress Notes (Signed)
POSTOPERATIVE DAY # 3 S/P CS  S:         Reports feeling better today             Tolerating po intake / no nausea / no vomiting / + flatus / no BM             Bleeding is light             Pain controlled with motrin and oxycodone             Up ad lib / ambulatory/ voiding QS  Newborn breast feeding    O:  VS: BP 138/89 (BP Location: Left Arm)   Pulse 95   Temp 97.8 F (36.6 C) (Oral)   Resp 18   Ht 5\' 4"  (1.626 m)   Wt 102.5 kg (226 lb)   LMP 09/24/2015 (Within Days)   SpO2 100%   Breastfeeding? Unknown   BMI 38.79 kg/m              Physical Exam:             Alert and Oriented X3  Lungs: Clear and unlabored  Heart: regular rate and rhythm / no mumurs  Abdomen: soft, non-tender, non-distended active BS             Fundus: firm, non-tender, U-1             Dressing intact (2nd dressing)              Incision:  approximated with staples / no erythema / no ecchymosis / no drainage  Perineum: intact  Lochia: light  Extremities: 2+ dependent edema, no calf pain or tenderness, negative Homans  A:        POD # 3 S/P CS            Dependent edema  P:        Routine postoperative care              Continue HCTZ 25 mg             BP / staple removal Monday at [place], [place], MSN, [facility] 06/10/2016, 9:35 AM

## 2016-10-08 ENCOUNTER — Emergency Department (HOSPITAL_COMMUNITY)
Admission: EM | Admit: 2016-10-08 | Discharge: 2016-10-08 | Disposition: A | Discharge status: Home / Self Care | Payer: BLUE CROSS/BLUE SHIELD | Attending: Emergency Medicine | Admitting: Emergency Medicine

## 2016-10-08 ENCOUNTER — Encounter (HOSPITAL_COMMUNITY): Payer: Self-pay | Admitting: *Deleted

## 2016-10-08 DIAGNOSIS — K0889 Other specified disorders of teeth and supporting structures: Secondary | ICD-10-CM | POA: Diagnosis not present

## 2016-10-08 DIAGNOSIS — K08409 Partial loss of teeth, unspecified cause, unspecified class: Secondary | ICD-10-CM

## 2016-10-08 DIAGNOSIS — Z79899 Other long term (current) drug therapy: Secondary | ICD-10-CM | POA: Insufficient documentation

## 2016-10-08 MED ORDER — AMOXICILLIN-POT CLAVULANATE 875-125 MG PO TABS: 1.0000 | ORAL_TABLET | Freq: Once | ORAL | Status: DC

## 2016-10-08 MED ORDER — AMOXICILLIN-POT CLAVULANATE 875-125 MG PO TABS: 1.0000 | ORAL_TABLET | Freq: Two times a day (BID) | ORAL | 0 refills | Status: DC

## 2016-10-08 NOTE — ED Triage Notes (Signed)
Pt reports having 4 molars removed on 1/9. Now has increase in jaw pain, decreases ROM, foul smelling odor in mouth. Denies fever. Airway intact. Minimal relief with pain meds at home.

## 2016-10-08 NOTE — ED Provider Notes (Signed)
[facility] DEPT Provider Note  CSN: [id number] Arrival date & time: 10/08/16  1207  History   Chief Complaint Chief Complaint  Patient presents with  . Dental Problem   HPI Dorothy Cisneros is a 36 y.o. female.  The history is provided by the patient. No language interpreter was used.  Illness  This is a new problem. The current episode started more than 2 days ago. The problem occurs constantly. The problem has been gradually worsening. Pertinent negatives include no chest pain, no abdominal pain, no headaches and no shortness of breath. Nothing aggravates the symptoms. The symptoms are relieved by ice and NSAIDs.    Past Medical History:  Diagnosis Date  . Gestational edema affecting puerperium 06/08/2016  . Postpartum care following cesarean delivery (9/12) 06/07/2016  . Postpartum care following cesarean delivery (9/12) 06/06/2016  . Postpartum care following cesarean delivery (9/13) 06/07/2016  . Sickle-cell trait [facility])    Patient Active Problem List   Diagnosis Date Noted  . Gestational edema affecting puerperium 06/08/2016  . Cholestasis of pregnancy in third trimester 06/07/2016  . Postpartum care following cesarean delivery (9/13) 06/07/2016  . Pregnancy 06/06/2016   Past Surgical History:  Procedure Laterality Date  . CESAREAN SECTION N/A 06/06/2016   Procedure: CESAREAN SECTION;  Surgeon: Aloha Gell, MD;  Location: [facility];  Service: Obstetrics;  Laterality: N/A;   OB History    Gravida Para Term Preterm AB Living   1 1 1     1    SAB TAB Ectopic Multiple Live Births         0 1      Home Medications    Prior to Admission medications   Medication Sig Start Date End Date Taking? Authorizing Provider  HYDROcodone-acetaminophen (NORCO) 7.5-325 MG tablet Take 1 tablet by mouth every 4 (four) hours as needed for moderate pain.  10/01/16  Yes Historical Provider, MD  ketorolac (TORADOL) 10 MG tablet Take 10 mg by mouth every 6 (six) hours as needed for  moderate pain.   Yes Historical Provider, MD  amoxicillin-clavulanate (AUGMENTIN) 875-125 MG tablet Take 1 tablet by mouth 2 (two) times daily. 10/08/16   Mayer Camel, MD  hydrochlorothiazide (HYDRODIURIL) 25 MG tablet Take 1 tablet (25 mg total) by mouth daily. Patient not taking: Reported on 10/08/2016 06/11/16   Artelia Laroche, CNM  ibuprofen (ADVIL,MOTRIN) 600 MG tablet Take 1 tablet (600 mg total) by mouth every 6 (six) hours. Patient not taking: Reported on 10/08/2016 06/10/16   Artelia Laroche, CNM  oxyCODONE-acetaminophen (PERCOCET/ROXICET) 5-325 MG tablet Take 1 tablet by mouth every 4 (four) hours as needed (pain scale 4-7). Patient not taking: Reported on 10/08/2016 06/10/16   Artelia Laroche, CNM   Family History History reviewed. No pertinent family history.  Social History Social History  Substance Use Topics  . Smoking status: Never Smoker  . Smokeless tobacco: Never Used  . Alcohol use Not on file    Allergies   Crab [shellfish allergy]  Review of Systems Review of Systems  Constitutional: Negative for chills and fever.  HENT: Positive for dental problem and facial swelling.   Respiratory: Negative for shortness of breath.   Cardiovascular: Negative for chest pain.  Gastrointestinal: Negative for abdominal pain.  Neurological: Negative for headaches.  All other systems reviewed and are negative.   Physical Exam Updated Vital Signs BP 122/94 (BP Location: Right Arm)   Pulse 72   Temp 98.7 F (37.1 C) (Oral)   Resp 17   LMP 10/04/2016  SpO2 100%   Physical Exam  Constitutional: She is oriented to person, place, and time. She appears well-developed and well-nourished. No distress.  HENT:  Head: Normocephalic and atraumatic.  Minimal amount of facial edema bilaterally without evidence of fluctuance or erythema or induration, palpation of dental roots showing no evidence of fluctuance, no evidence of purulent drainage from surgical extraction sites, no evidence of  trismus or tripoding or difficulty handling secretions  Eyes: EOM are normal. Pupils are equal, round, and reactive to light.  Neck: Normal range of motion. Neck supple.  Cardiovascular: Normal rate, regular rhythm and normal heart sounds.   Pulmonary/Chest: Effort normal and breath sounds normal.  Abdominal: Soft. Bowel sounds are normal. She exhibits no distension. There is no tenderness.  Musculoskeletal: Normal range of motion.  Neurological: She is alert and oriented to person, place, and time.  Skin: Skin is warm and dry. Capillary refill takes less than 2 seconds. She is not diaphoretic.  Nursing note and vitals reviewed.   ED Treatments / Results  Labs (all labs ordered are listed, but only abnormal results are displayed) Labs Reviewed - No data to display  EKG  EKG Interpretation None      Radiology No results found.  Procedures Procedures (including critical care time)  Medications Ordered in ED Medications - No data to display   Initial Impression / Assessment and Plan / ED Course  I have reviewed the triage vital signs and the nursing notes.  Clinical Course   36 y.o. female with above stated PMHx, HPI, and physical. Patient had all 4 wisdom teeth extracted 5 days ago. Initially with diffuse edema which is now settled in lower jaw bilaterally. Patient denies fever, chills, prematurity drainage, sore throat, difficulty swallowing, difficulty breathing. Patient also notes malodorous breath.  Patient afebrile here. No evidence of erythema or focal abscess on exam. Patient given dose of Augmentin in the emergency department as well as prescription for the same at home and will call her dentist tomorrow morning (already has appointment for 2 days but will assess need for earlier follow-up).  Pt discharged home in stable condition. Strict ED return precautions dicussed. Pt understands and agrees with the plan and has no further questions or concerns.   Pt care  discussed with and followed by my attending, Dr. Clarene Critchley, MD Pager [phone]  Final Clinical Impressions(s) / ED Diagnoses   Final diagnoses:  Pain, dental  History of third molar (wisdom) tooth extraction   New Prescriptions Discharge Medication List as of 10/08/2016  5:27 PM    START taking these medications   Details  amoxicillin-clavulanate (AUGMENTIN) 875-125 MG tablet Take 1 tablet by mouth 2 (two) times daily., Starting Sun 10/08/2016, Print         Mayer Camel, MD 10/09/16 0025    Pattricia Boss, MD 10/10/16 1239

## 2016-10-08 NOTE — ED Notes (Signed)
Pt did not want to wait for Antibiotic will get script filled

## 2016-10-08 NOTE — ED Notes (Signed)
Spoke with ER Resident about opt. Wanting to see pt regarding discharge

## 2017-01-09 MED FILL — OLOPATADINE HCL 0.1% EYE DR: 0.1 | 25 days supply | Qty: 5 | Fill #0

## 2017-01-17 LAB — OB RESULTS CONSOLE ABO/RH: RH TYPE: POSITIVE

## 2017-01-17 LAB — OB RESULTS CONSOLE HIV ANTIBODY (ROUTINE TESTING): HIV: NONREACTIVE

## 2017-01-17 LAB — OB RESULTS CONSOLE HEPATITIS B SURFACE ANTIGEN: Hepatitis B Surface Ag: NEGATIVE

## 2017-01-17 LAB — OB RESULTS CONSOLE RPR: RPR: NONREACTIVE

## 2017-01-17 LAB — OB RESULTS CONSOLE RUBELLA ANTIBODY, IGM: Rubella: IMMUNE

## 2017-01-17 LAB — OB RESULTS CONSOLE ANTIBODY SCREEN: ANTIBODY SCREEN: NEGATIVE

## 2017-04-26 MED FILL — PANTOPRAZOLE SOD DR 40 MG T: 40 | 30 days supply | Qty: 30 | Fill #0

## 2017-06-26 ENCOUNTER — Other Ambulatory Visit: Payer: Self-pay | Admitting: Obstetrics

## 2017-07-09 ENCOUNTER — Ambulatory Visit (HOSPITAL_COMMUNITY): Payer: BLUE CROSS/BLUE SHIELD | Admitting: Licensed Clinical Social Worker

## 2017-07-10 LAB — OB RESULTS CONSOLE GBS: GBS: POSITIVE

## 2017-07-24 ENCOUNTER — Telehealth (HOSPITAL_COMMUNITY): Payer: Self-pay | Admitting: *Deleted

## 2017-07-24 ENCOUNTER — Encounter (HOSPITAL_COMMUNITY): Payer: Self-pay | Admitting: *Deleted

## 2017-07-24 NOTE — Telephone Encounter (Signed)
Preadmission screen  

## 2017-07-26 ENCOUNTER — Telehealth (HOSPITAL_COMMUNITY): Payer: Self-pay | Admitting: *Deleted

## 2017-07-26 NOTE — Telephone Encounter (Signed)
Preadmission screen  

## 2017-07-27 ENCOUNTER — Telehealth (HOSPITAL_COMMUNITY): Payer: Self-pay | Admitting: *Deleted

## 2017-07-27 NOTE — Telephone Encounter (Signed)
Preadmission screen  

## 2017-07-30 ENCOUNTER — Encounter (HOSPITAL_COMMUNITY): Payer: Self-pay

## 2017-08-01 ENCOUNTER — Encounter (HOSPITAL_COMMUNITY)
Admission: RE | Admit: 2017-08-01 | Discharge: 2017-08-01 | Disposition: A | Discharge status: Home / Self Care | Payer: BLUE CROSS/BLUE SHIELD | Source: Ambulatory Visit | Attending: Obstetrics | Admitting: Obstetrics

## 2017-08-01 HISTORY — DX: Personal history of other complications of pregnancy, childbirth and the puerperium: Z87.59

## 2017-08-01 HISTORY — DX: Gastro-esophageal reflux disease without esophagitis: K21.9

## 2017-08-01 HISTORY — DX: Supervision of elderly multigravida, unspecified trimester: O09.529

## 2017-08-01 LAB — CBC
HCT: 38 % (ref 36.0–46.0)
Hemoglobin: 13.3 g/dL (ref 12.0–15.0)
MCH: 27.8 pg (ref 26.0–34.0)
MCHC: 35 g/dL (ref 30.0–36.0)
MCV: 79.5 fL (ref 78.0–100.0)
Platelets: 307 10*3/uL (ref 150–400)
RBC: 4.78 MIL/uL (ref 3.87–5.11)
RDW: 15.4 % (ref 11.5–15.5)
WBC: 11.1 10*3/uL — ABNORMAL HIGH (ref 4.0–10.5)

## 2017-08-01 LAB — TYPE AND SCREEN
ABO/RH(D): O POS
ANTIBODY SCREEN: NEGATIVE

## 2017-08-01 NOTE — H&P (Signed)
Dorothy Cisneros is a 36 y.o. G2P1001 at [redacted]w[redacted]d presenting for RCS. Pt notes few contractions. Good fetal movement, No vaginal bleeding, not leaking fluid.  [facility]are at [facility] since 8 wks - Unplanned but desired preg. Short interpregnancy interval and pregnant 7 mo PP despite IVF preg in past - h/o PEC with cholestasis, nl screening this preg, on baby ASA - Prior c/s due to CPD, difficult delivery of low head after 3hr pushing - SS trait carrier, FOB neg - AMA, nl Informaseq   Prenatal Transfer Tool  Maternal Diabetes: No Genetic Screening: Normal Maternal Ultrasounds/Referrals: Normal Fetal Ultrasounds or other Referrals:  None Maternal Substance Abuse:  No Significant Maternal Medications:  None Significant Maternal Lab Results: None     OB History    Gravida Para Term Preterm AB Living   2 1 1     1    SAB TAB Ectopic Multiple Live Births         0 1     Past Medical History:  Diagnosis Date  . AMA (advanced maternal age) multigravida 42+   . GERD (gastroesophageal reflux disease)   . Gestational edema affecting puerperium 06/08/2016  . History of gestational hypertension   . Postpartum care following cesarean delivery (9/12) 06/07/2016  . Postpartum care following cesarean delivery (9/12) 06/06/2016  . Postpartum care following cesarean delivery (9/13) 06/07/2016  . Sickle-cell trait ([place])    No past surgical history on file. Family History: family history includes Alzheimer's disease in her maternal aunt; Breast cancer in her maternal aunt and mother; Diabetes in her maternal grandmother and mother; Heart disease in her maternal grandmother; Heart failure in her mother; Hypertension in her maternal grandmother and mother; Stroke in her mother. Social History:  reports that  has never smoked. she has never used smokeless tobacco. She reports that she does not drink alcohol or use drugs.  Review of Systems - Negative except discomfort of preg     Last menstrual  period 10/04/2016, unknown if currently breastfeeding.  Physical Exam:   Vitals:   08/02/17 0602  BP: 128/85  Pulse: 98  Resp: 18  Temp: 98.5 F (36.9 C)  TempSrc: Oral  Weight: 96.2 kg (212 lb)  Height: 5\' 4"  (1.626 m)    Gen: well appearing, no distress Back: no CVAT Abd: gravid, NT, no RUQ pain LE: trace edema, equal bilaterally, non-tender Toco: FHTs positive   Prenatal labs: ABO, Rh: --/--/O POS (11/07 1040) Antibody: NEG (11/07 1040) Rubella: Immune (04/25 0000) RPR: Nonreactive (04/25 0000)  HBsAg: Negative (04/25 0000)  HIV: Non-reactive (04/25 0000)  GBS: Positive (10/16 0000)  1 hr Glucola 89  Genetic screening nl Informaseq, nl NT Anatomy US normal   Assessment/Plan: 36 y.o. G2P1001 at [redacted]w[redacted]d RCS. R/B reviewed with pt - h/o PEC, watch closely   Chizuko Trine A. 08/01/2017, 3:49 PM

## 2017-08-01 NOTE — Patient Instructions (Signed)
Dorothy Cisneros  08/01/2017   Your procedure is scheduled on:  08/02/2017  Enter through the Main Entrance of [facility] up the phone at the desk and dial [phone]  Call this number if you have problems the morning of surgery:[phone]  Remember:   Do not eat food:After Midnight.  Do not drink clear liquids: After Midnight.  Take these medicines the morning of surgery with A SIP OF WATER: may take protonix or zantac   Do not wear jewelry, make-up or nail polish.  Do not wear lotions, powders, or perfumes. Do not wear deodorant.  Do not shave 48 hours prior to surgery.  Do not bring valuables to the hospital.  [facility] is not   responsible for any belongings or valuables brought to the hospital.  Contacts, dentures or bridgework may not be worn into surgery.  Leave suitcase in the car. After surgery it may be brought to your room.  For patients admitted to the hospital, checkout time is 11:00 AM the day of              discharge.    N/A   Please read over the following fact sheets that you were given:   Surgical Site Infection Prevention

## 2017-08-02 ENCOUNTER — Inpatient Hospital Stay (HOSPITAL_COMMUNITY): Payer: BLUE CROSS/BLUE SHIELD | Admitting: Certified Registered Nurse Anesthetist

## 2017-08-02 ENCOUNTER — Encounter (HOSPITAL_COMMUNITY)
Admission: RE | Disposition: A | Discharge status: Home / Self Care | Payer: BLUE CROSS/BLUE SHIELD | Source: Ambulatory Visit | Attending: Obstetrics

## 2017-08-02 ENCOUNTER — Encounter (HOSPITAL_COMMUNITY): Payer: Self-pay

## 2017-08-02 ENCOUNTER — Other Ambulatory Visit: Payer: Self-pay

## 2017-08-02 ENCOUNTER — Inpatient Hospital Stay (HOSPITAL_COMMUNITY)
Admission: RE | Admit: 2017-08-02 | Discharge: 2017-08-05 | DRG: 788 | Disposition: A | Discharge status: Home / Self Care | Payer: BLUE CROSS/BLUE SHIELD | Source: Ambulatory Visit | Attending: Obstetrics | Admitting: Obstetrics

## 2017-08-02 DIAGNOSIS — D573 Sickle-cell trait: Secondary | ICD-10-CM | POA: Diagnosis present

## 2017-08-02 DIAGNOSIS — O34211 Maternal care for low transverse scar from previous cesarean delivery: Secondary | ICD-10-CM | POA: Diagnosis present

## 2017-08-02 DIAGNOSIS — O99214 Obesity complicating childbirth: Secondary | ICD-10-CM | POA: Diagnosis present

## 2017-08-02 DIAGNOSIS — D259 Leiomyoma of uterus, unspecified: Secondary | ICD-10-CM | POA: Diagnosis present

## 2017-08-02 DIAGNOSIS — O99824 Streptococcus B carrier state complicating childbirth: Secondary | ICD-10-CM | POA: Diagnosis present

## 2017-08-02 DIAGNOSIS — O9902 Anemia complicating childbirth: Secondary | ICD-10-CM | POA: Diagnosis present

## 2017-08-02 DIAGNOSIS — Z98891 History of uterine scar from previous surgery: Secondary | ICD-10-CM

## 2017-08-02 DIAGNOSIS — O9962 Diseases of the digestive system complicating childbirth: Secondary | ICD-10-CM | POA: Diagnosis present

## 2017-08-02 DIAGNOSIS — Z3A39 39 weeks gestation of pregnancy: Secondary | ICD-10-CM

## 2017-08-02 DIAGNOSIS — O3413 Maternal care for benign tumor of corpus uteri, third trimester: Secondary | ICD-10-CM | POA: Diagnosis present

## 2017-08-02 DIAGNOSIS — L910 Hypertrophic scar: Secondary | ICD-10-CM | POA: Diagnosis present

## 2017-08-02 DIAGNOSIS — K219 Gastro-esophageal reflux disease without esophagitis: Secondary | ICD-10-CM | POA: Diagnosis present

## 2017-08-02 DIAGNOSIS — E669 Obesity, unspecified: Secondary | ICD-10-CM | POA: Diagnosis present

## 2017-08-02 LAB — RPR: RPR Ser Ql: NONREACTIVE

## 2017-08-02 SURGERY — Surgical Case
Anesthesia: Spinal

## 2017-08-02 MED ORDER — NALOXONE HCL 2 MG/2ML IJ SOSY
1.0000 ug/kg/h | PREFILLED_SYRINGE | INTRAMUSCULAR | Status: DC | PRN
  Filled 2017-08-02: qty 2

## 2017-08-02 MED ORDER — DIPHENHYDRAMINE HCL 50 MG/ML IJ SOLN: 12.5000 mg | INTRAMUSCULAR | Status: DC | PRN

## 2017-08-02 MED ORDER — OXYCODONE HCL 5 MG/5ML PO SOLN: 5.0000 mg | Freq: Once | ORAL | Status: DC | PRN

## 2017-08-02 MED ORDER — MORPHINE SULFATE (PF) 0.5 MG/ML IJ SOLN
INTRAMUSCULAR | Status: DC | PRN
  Administered 2017-08-02: .2 mg via INTRATHECAL

## 2017-08-02 MED ORDER — ACETAMINOPHEN 325 MG PO TABS: 650.0000 mg | ORAL_TABLET | ORAL | Status: DC | PRN

## 2017-08-02 MED ORDER — OXYTOCIN 10 UNIT/ML IJ SOLN
INTRAMUSCULAR | Status: AC
  Filled 2017-08-02: qty 4

## 2017-08-02 MED ORDER — WITCH HAZEL-GLYCERIN EX PADS: 1.0000 "application " | MEDICATED_PAD | CUTANEOUS | Status: DC | PRN

## 2017-08-02 MED ORDER — SCOPOLAMINE 1 MG/3DAYS TD PT72
MEDICATED_PATCH | TRANSDERMAL | Status: AC
Start: 2017-08-02 — End: 2017-08-02
  Filled 2017-08-02: qty 1

## 2017-08-02 MED ORDER — NALOXONE HCL 0.4 MG/ML IJ SOLN: 0.4000 mg | INTRAMUSCULAR | Status: DC | PRN

## 2017-08-02 MED ORDER — SIMETHICONE 80 MG PO CHEW
80.0000 mg | CHEWABLE_TABLET | Freq: Three times a day (TID) | ORAL | Status: DC
  Administered 2017-08-02 – 2017-08-05 (×7): 80 mg via ORAL
  Filled 2017-08-02 (×8): qty 1

## 2017-08-02 MED ORDER — CEFAZOLIN SODIUM-DEXTROSE 2-3 GM-%(50ML) IV SOLR
INTRAVENOUS | Status: AC
  Filled 2017-08-02: qty 50

## 2017-08-02 MED ORDER — SODIUM CHLORIDE 0.9 % IR SOLN
Status: DC | PRN
  Administered 2017-08-02: 1000 mL

## 2017-08-02 MED ORDER — ONDANSETRON HCL 4 MG/2ML IJ SOLN
INTRAMUSCULAR | Status: DC | PRN
  Administered 2017-08-02: 4 mg via INTRAVENOUS

## 2017-08-02 MED ORDER — OXYTOCIN 10 UNIT/ML IJ SOLN
INTRAVENOUS | Status: DC | PRN
  Administered 2017-08-02: 40 [IU] via INTRAVENOUS

## 2017-08-02 MED ORDER — MEPERIDINE HCL 25 MG/ML IJ SOLN
INTRAMUSCULAR | Status: DC | PRN
  Administered 2017-08-02: 12.5 mg via INTRAVENOUS

## 2017-08-02 MED ORDER — PHENYLEPHRINE 8 MG IN D5W 100 ML (0.08MG/ML) PREMIX OPTIME
INJECTION | INTRAVENOUS | Status: DC | PRN
  Administered 2017-08-02: 20 ug/min via INTRAVENOUS

## 2017-08-02 MED ORDER — CEFAZOLIN SODIUM-DEXTROSE 2-4 GM/100ML-% IV SOLN
2.0000 g | INTRAVENOUS | Status: AC
  Administered 2017-08-02: 2 g via INTRAVENOUS

## 2017-08-02 MED ORDER — TRIAMCINOLONE ACETONIDE 40 MG/ML IJ SUSP
40.0000 mg | Freq: Once | INTRAMUSCULAR | Status: DC
  Filled 2017-08-02: qty 1

## 2017-08-02 MED ORDER — PHENYLEPHRINE 8 MG IN D5W 100 ML (0.08MG/ML) PREMIX OPTIME
INJECTION | INTRAVENOUS | Status: AC
  Filled 2017-08-02: qty 100

## 2017-08-02 MED ORDER — OXYTOCIN 40 UNITS IN LACTATED RINGERS INFUSION - SIMPLE MED: 2.5000 [IU]/h | INTRAVENOUS | Status: AC

## 2017-08-02 MED ORDER — SIMETHICONE 80 MG PO CHEW
80.0000 mg | CHEWABLE_TABLET | ORAL | Status: DC
  Administered 2017-08-02 – 2017-08-04 (×3): 80 mg via ORAL
  Filled 2017-08-02 (×3): qty 1

## 2017-08-02 MED ORDER — MORPHINE SULFATE (PF) 0.5 MG/ML IJ SOLN: INTRAMUSCULAR | Status: DC | PRN

## 2017-08-02 MED ORDER — TRIAMCINOLONE ACETONIDE 40 MG/ML IJ SUSP
INTRAMUSCULAR | Status: DC | PRN
  Administered 2017-08-02: 40 mg via INTRAMUSCULAR

## 2017-08-02 MED ORDER — PROMETHAZINE HCL 25 MG/ML IJ SOLN: 6.2500 mg | INTRAMUSCULAR | Status: DC | PRN

## 2017-08-02 MED ORDER — PRENATAL MULTIVITAMIN CH
1.0000 | ORAL_TABLET | Freq: Every day | ORAL | Status: DC
  Administered 2017-08-03 – 2017-08-05 (×3): 1 via ORAL
  Filled 2017-08-02 (×3): qty 1

## 2017-08-02 MED ORDER — ONDANSETRON HCL 4 MG/2ML IJ SOLN
INTRAMUSCULAR | Status: AC
  Filled 2017-08-02: qty 2

## 2017-08-02 MED ORDER — BUPIVACAINE IN DEXTROSE 0.75-8.25 % IT SOLN
INTRATHECAL | Status: AC
  Filled 2017-08-02: qty 2

## 2017-08-02 MED ORDER — SODIUM CHLORIDE 0.9% FLUSH: 3.0000 mL | INTRAVENOUS | Status: DC | PRN

## 2017-08-02 MED ORDER — PHENYLEPHRINE 8 MG IN D5W 100 ML (0.08MG/ML) PREMIX OPTIME: INJECTION | INTRAVENOUS | Status: DC | PRN

## 2017-08-02 MED ORDER — LIDOCAINE HCL 1 % IJ SOLN
INTRAMUSCULAR | Status: DC | PRN
  Administered 2017-08-02: 10 mL

## 2017-08-02 MED ORDER — SCOPOLAMINE 1 MG/3DAYS TD PT72
1.0000 | MEDICATED_PATCH | Freq: Once | TRANSDERMAL | Status: DC
  Administered 2017-08-02: 1.5 mg via TRANSDERMAL

## 2017-08-02 MED ORDER — NALBUPHINE HCL 10 MG/ML IJ SOLN: 5.0000 mg | INTRAMUSCULAR | Status: DC | PRN

## 2017-08-02 MED ORDER — DEXAMETHASONE SODIUM PHOSPHATE 4 MG/ML IJ SOLN
INTRAMUSCULAR | Status: AC
  Filled 2017-08-02: qty 1

## 2017-08-02 MED ORDER — LACTATED RINGERS IV SOLN
INTRAVENOUS | Status: DC
  Administered 2017-08-02: 12:00:00 via INTRAVENOUS

## 2017-08-02 MED ORDER — DIBUCAINE 1 % RE OINT: 1.0000 "application " | TOPICAL_OINTMENT | RECTAL | Status: DC | PRN

## 2017-08-02 MED ORDER — HYDROMORPHONE HCL 1 MG/ML IJ SOLN
INTRAMUSCULAR | Status: AC
Start: 2017-08-02 — End: 2017-08-02
  Filled 2017-08-02: qty 0.5

## 2017-08-02 MED ORDER — DIPHENHYDRAMINE HCL 25 MG PO CAPS: 25.0000 mg | ORAL_CAPSULE | ORAL | Status: DC | PRN

## 2017-08-02 MED ORDER — ZOLPIDEM TARTRATE 5 MG PO TABS: 5.0000 mg | ORAL_TABLET | Freq: Every evening | ORAL | Status: DC | PRN

## 2017-08-02 MED ORDER — MENTHOL 3 MG MT LOZG: 1.0000 | LOZENGE | OROMUCOSAL | Status: DC | PRN

## 2017-08-02 MED ORDER — FENTANYL CITRATE (PF) 100 MCG/2ML IJ SOLN
INTRAMUSCULAR | Status: DC | PRN
  Administered 2017-08-02: 10 ug via INTRAVENOUS

## 2017-08-02 MED ORDER — DIPHENHYDRAMINE HCL 25 MG PO CAPS: 25.0000 mg | ORAL_CAPSULE | Freq: Four times a day (QID) | ORAL | Status: DC | PRN

## 2017-08-02 MED ORDER — IBUPROFEN 600 MG PO TABS
600.0000 mg | ORAL_TABLET | Freq: Four times a day (QID) | ORAL | Status: DC
  Administered 2017-08-02 – 2017-08-05 (×12): 600 mg via ORAL
  Filled 2017-08-02 (×12): qty 1

## 2017-08-02 MED ORDER — ONDANSETRON HCL 4 MG/2ML IJ SOLN: 4.0000 mg | Freq: Three times a day (TID) | INTRAMUSCULAR | Status: DC | PRN

## 2017-08-02 MED ORDER — SIMETHICONE 80 MG PO CHEW: 80.0000 mg | CHEWABLE_TABLET | ORAL | Status: DC | PRN

## 2017-08-02 MED ORDER — OXYCODONE HCL 5 MG PO TABS: 5.0000 mg | ORAL_TABLET | Freq: Once | ORAL | Status: DC | PRN

## 2017-08-02 MED ORDER — NALBUPHINE HCL 10 MG/ML IJ SOLN: 5.0000 mg | Freq: Once | INTRAMUSCULAR | Status: DC | PRN

## 2017-08-02 MED ORDER — FENTANYL CITRATE (PF) 100 MCG/2ML IJ SOLN
INTRAMUSCULAR | Status: AC
  Filled 2017-08-02: qty 2

## 2017-08-02 MED ORDER — LIDOCAINE HCL 1 % IJ SOLN
INTRAMUSCULAR | Status: AC
  Filled 2017-08-02: qty 20

## 2017-08-02 MED ORDER — LACTATED RINGERS IV SOLN
INTRAVENOUS | Status: DC | PRN
  Administered 2017-08-02: 08:00:00 via INTRAVENOUS

## 2017-08-02 MED ORDER — LACTATED RINGERS IV SOLN
INTRAVENOUS | Status: DC
  Administered 2017-08-02 (×2): via INTRAVENOUS

## 2017-08-02 MED ORDER — MEPERIDINE HCL 25 MG/ML IJ SOLN
INTRAMUSCULAR | Status: AC
  Filled 2017-08-02: qty 1

## 2017-08-02 MED ORDER — PANTOPRAZOLE SODIUM 20 MG PO TBEC
20.0000 mg | DELAYED_RELEASE_TABLET | Freq: Every day | ORAL | Status: DC | PRN
  Filled 2017-08-02: qty 1

## 2017-08-02 MED ORDER — BUPIVACAINE IN DEXTROSE 0.75-8.25 % IT SOLN
INTRATHECAL | Status: DC | PRN
  Administered 2017-08-02: 1.6 mL via INTRATHECAL

## 2017-08-02 MED ORDER — DEXAMETHASONE SODIUM PHOSPHATE 4 MG/ML IJ SOLN
INTRAMUSCULAR | Status: DC | PRN
  Administered 2017-08-02: 4 mg via INTRAVENOUS

## 2017-08-02 MED ORDER — HYDROMORPHONE HCL 1 MG/ML IJ SOLN
0.2500 mg | INTRAMUSCULAR | Status: DC | PRN
  Administered 2017-08-02 (×2): 0.25 mg via INTRAVENOUS

## 2017-08-02 MED ORDER — SODIUM CHLORIDE 0.9 % IJ SOLN
INTRAMUSCULAR | Status: AC
  Filled 2017-08-02: qty 20

## 2017-08-02 MED ORDER — MEPERIDINE HCL 25 MG/ML IJ SOLN: 6.2500 mg | INTRAMUSCULAR | Status: DC | PRN

## 2017-08-02 MED ORDER — SENNOSIDES-DOCUSATE SODIUM 8.6-50 MG PO TABS
2.0000 | ORAL_TABLET | ORAL | Status: DC
  Administered 2017-08-02 – 2017-08-04 (×3): 2 via ORAL
  Filled 2017-08-02 (×3): qty 2

## 2017-08-02 MED ORDER — COCONUT OIL OIL: 1.0000 "application " | TOPICAL_OIL | Status: DC | PRN

## 2017-08-02 MED ORDER — MORPHINE SULFATE (PF) 0.5 MG/ML IJ SOLN
INTRAMUSCULAR | Status: AC
  Filled 2017-08-02: qty 10

## 2017-08-02 MED ORDER — TETANUS-DIPHTH-ACELL PERTUSSIS 5-2.5-18.5 LF-MCG/0.5 IM SUSP: 0.5000 mL | Freq: Once | INTRAMUSCULAR | Status: DC

## 2017-08-02 MED ORDER — PHENYLEPHRINE HCL 10 MG/ML IJ SOLN
INTRAMUSCULAR | Status: DC | PRN
  Administered 2017-08-02: 80 ug via INTRAVENOUS

## 2017-08-02 SURGICAL SUPPLY — 39 items
BENZOIN TINCTURE PRP APPL 2/3 (GAUZE/BANDAGES/DRESSINGS) ×2 IMPLANT
CHLORAPREP W/TINT 26ML (MISCELLANEOUS) ×2 IMPLANT
CLAMP CORD UMBIL (MISCELLANEOUS) IMPLANT
CLOSURE STERI STRIP 1/2 X4 (GAUZE/BANDAGES/DRESSINGS) ×2 IMPLANT
CLOTH BEACON ORANGE TIMEOUT ST (SAFETY) ×2 IMPLANT
CONTAINER PREFILL 10% NBF 15ML (MISCELLANEOUS) IMPLANT
DRSG OPSITE POSTOP 4X10 (GAUZE/BANDAGES/DRESSINGS) ×2 IMPLANT
ELECT REM PT RETURN 9FT ADLT (ELECTROSURGICAL) ×2
ELECTRODE REM PT RTRN 9FT ADLT (ELECTROSURGICAL) ×1 IMPLANT
EXTRACTOR VACUUM M CUP 4 TUBE (SUCTIONS) IMPLANT
GLOVE BIO SURGEON STRL SZ 6.5 (GLOVE) ×2 IMPLANT
GLOVE BIOGEL PI IND STRL 7.0 (GLOVE) ×2 IMPLANT
GLOVE BIOGEL PI INDICATOR 7.0 (GLOVE) ×2
GOWN STRL REUS W/TWL LRG LVL3 (GOWN DISPOSABLE) ×4 IMPLANT
KIT ABG SYR 3ML LUER SLIP (SYRINGE) IMPLANT
NEEDLE HYPO 22GX1.5 SAFETY (NEEDLE) IMPLANT
NEEDLE HYPO 25X5/8 SAFETYGLIDE (NEEDLE) IMPLANT
NS IRRIG 1000ML POUR BTL (IV SOLUTION) ×2 IMPLANT
PACK C SECTION WH (CUSTOM PROCEDURE TRAY) ×2 IMPLANT
PAD ABD 7.5X8 STRL (GAUZE/BANDAGES/DRESSINGS) ×4 IMPLANT
PAD OB MATERNITY 4.3X12.25 (PERSONAL CARE ITEMS) ×2 IMPLANT
PENCIL SMOKE EVAC W/HOLSTER (ELECTROSURGICAL) ×2 IMPLANT
SPONGE GAUZE 4X4 12PLY STER LF (GAUZE/BANDAGES/DRESSINGS) ×4 IMPLANT
STRIP CLOSURE SKIN 1/2X4 (GAUZE/BANDAGES/DRESSINGS) IMPLANT
SUT MON AB 4-0 PS1 27 (SUTURE) ×2 IMPLANT
SUT PLAIN 0 NONE (SUTURE) IMPLANT
SUT PLAIN 2 0 (SUTURE) ×1
SUT PLAIN 2 0 XLH (SUTURE) IMPLANT
SUT PLAIN ABS 2-0 CT1 27XMFL (SUTURE) ×1 IMPLANT
SUT VIC AB 0 CT1 36 (SUTURE) ×4 IMPLANT
SUT VIC AB 0 CTX 36 (SUTURE) ×2
SUT VIC AB 0 CTX36XBRD ANBCTRL (SUTURE) ×2 IMPLANT
SUT VIC AB 2-0 CT1 27 (SUTURE) ×1
SUT VIC AB 2-0 CT1 TAPERPNT 27 (SUTURE) ×1 IMPLANT
SYR 20CC LL (SYRINGE) ×4 IMPLANT
SYR CONTROL 10ML LL (SYRINGE) IMPLANT
TAPE PAPER 3X10 WHT MICROPORE (GAUZE/BANDAGES/DRESSINGS) ×2 IMPLANT
TOWEL OR 17X24 6PK STRL BLUE (TOWEL DISPOSABLE) ×2 IMPLANT
TRAY FOLEY BAG SILVER LF 14FR (SET/KITS/TRAYS/PACK) IMPLANT

## 2017-08-02 NOTE — Anesthesia Procedure Notes (Signed)
Spinal  Patient location during procedure: OB Start time: 08/02/2017 7:40 AM End time: 08/02/2017 7:50 AM Staffing Anesthesiologist: [name], MD Performed: anesthesiologist  Preanesthetic Checklist Completed: patient identified, surgical consent, pre-op evaluation, timeout performed, IV checked, risks and benefits discussed and monitors and equipment checked Spinal Block Patient position: sitting Prep: site prepped and draped and DuraPrep Patient monitoring: heart rate, cardiac monitor, continuous pulse ox and blood pressure Approach: midline Location: L3-4 Injection technique: single-shot Needle Needle type: Pencan  Needle gauge: 24 G Needle length: 10 cm Assessment Sensory level: T4

## 2017-08-02 NOTE — Lactation Note (Signed)
This note was copied from a baby's chart. Lactation Consultation Note: Lactation brochure given with review of basic teaching. Mother has a 60 month old toddler that she breastfed for 4 months. Mother reports that infant is feeding well. Reviewed hand expression with mother . Mother is able to see colostrum.  Assist mother with football hold. Infant latched on with wide gape. Advised  Mother to use nipple to nose technique . Infant sustained latch for 10-15 mins. Mother sleepy and advised to page for nurse to put infant in crib is she feels like she is going to sleep. Mother to cue base feed infant and at least 8-12 times in 24 hours.  Mother receptive to all teaching . Mother informed of available [place] services and community support.   Patient Name: Dorothy Cisneros [id number] Date: 08/02/2017 Reason for consult: Initial assessment   Maternal Data Has patient been taught Hand Expression?: Yes Does the patient have breastfeeding experience prior to this delivery?: Yes  Feeding Feeding Type: Breast Fed Length of feed: 10 min  LATCH Score Latch: Grasps breast easily, tongue down, lips flanged, rhythmical sucking.  Audible Swallowing: A few with stimulation  Type of Nipple: Everted at rest and after stimulation  Comfort (Breast/Nipple): Soft / non-tender  Hold (Positioning): Assistance needed to correctly position infant at breast and maintain latch.  LATCH Score: 8  Interventions Interventions: Breast feeding basics reviewed;Assisted with latch;Hand express;Adjust position;Support pillows;Position options;Expressed milk  Lactation Tools Discussed/Used     Consult Status Consult Status: Follow-up Date: 08/03/17 Follow-up type: In-patient    Jess Barters [facility] 08/02/2017, 3:25 PM

## 2017-08-02 NOTE — Brief Op Note (Signed)
08/02/2017  8:54 AM  PATIENT:  Dorothy Cisneros  36 y.o. female  PRE-OPERATIVE DIAGNOSIS:  Previous Cesarean Section  POST-OPERATIVE DIAGNOSIS:  Previous Cesarean Section  PROCEDURE:  Procedure(s) with comments: Repeat CESAREAN SECTION (N/A) - EDD: 08/09/17 Allergy: Shellfish Jill Side, RNFA  LTCS with 2 layer closure Scar revision  SURGEON:  Surgeon(s) and Role:    * Aloha Gell, MD - Primary  PHYSICIAN ASSISTANT:   ASSISTANTS: RFNA   ANESTHESIA:   spinal  EBL:  462 mL   BLOOD ADMINISTERED:none  DRAINS: Urinary Catheter (Foley)   LOCAL MEDICATIONS USED:  OTHER 40 units Kenalog mixed with 10cc 1% plain lidocaine  SPECIMEN:  Source of Specimen:  Keloid scar, placenta  DISPOSITION OF SPECIMEN:  L&D  COUNTS:  YES  TOURNIQUET:  * No tourniquets in log *  DICTATION: .Note written in EPIC  PLAN OF CARE: Admit to inpatient   PATIENT DISPOSITION:  PACU - hemodynamically stable.   Delay start of Pharmacological VTE agent (>24hrs) due to surgical blood loss or risk of bleeding: yes

## 2017-08-02 NOTE — Anesthesia Preprocedure Evaluation (Signed)
Anesthesia Evaluation  Patient identified by MRN, date of birth, ID band Patient awake    Reviewed: Allergy & Precautions, NPO status , Patient's Chart, lab work & pertinent test results  History of Anesthesia Complications Negative for: history of anesthetic complications  Airway Mallampati: II  TM Distance: >3 FB Neck ROM: Full    Dental no notable dental hx. (+) Dental Advisory Given   Pulmonary neg pulmonary ROS,    Pulmonary exam normal breath sounds clear to auscultation       Cardiovascular hypertension, Pt. on medications negative cardio ROS Normal cardiovascular exam Rhythm:Regular Rate:Normal     Neuro/Psych negative neurological ROS  negative psych ROS   GI/Hepatic negative GI ROS, Neg liver ROS, GERD  ,  Endo/Other  obesity  Renal/GU negative Renal ROS  negative genitourinary   Musculoskeletal negative musculoskeletal ROS (+)   Abdominal (+) + obese,   Peds negative pediatric ROS (+)  Hematology negative hematology ROS (+)   Anesthesia Other Findings   Reproductive/Obstetrics (+) Pregnancy                             Anesthesia Physical  Anesthesia Plan  ASA: II  Anesthesia Plan: Spinal   Post-op Pain Management:    Induction:   PONV Risk Score and Plan: 2 and Treatment may vary due to age or medical condition and Ondansetron  Airway Management Planned: Natural Airway  Additional Equipment:   Intra-op Plan:   Post-operative Plan:   Informed Consent: I have reviewed the patients History and Physical, chart, labs and discussed the procedure including the risks, benefits and alternatives for the proposed anesthesia with the patient or authorized representative who has indicated his/her understanding and acceptance.   Dental advisory given  Plan Discussed with: CRNA  Anesthesia Plan Comments:         Anesthesia Quick Evaluation

## 2017-08-02 NOTE — Anesthesia Postprocedure Evaluation (Signed)
Anesthesia Post Note  Patient: Dorothy Cisneros  Procedure(s) Performed: Repeat CESAREAN SECTION (N/A )     Patient location during evaluation: PACU Anesthesia Type: Spinal Level of consciousness: oriented and awake and alert Pain management: pain level controlled Vital Signs Assessment: post-procedure vital signs reviewed and stable Respiratory status: spontaneous breathing and respiratory function stable Cardiovascular status: blood pressure returned to baseline and stable Postop Assessment: no headache, no backache and no apparent nausea or vomiting Anesthetic complications: no    Last Vitals:  Vitals:   08/02/17 1000 08/02/17 1015  BP: 124/71 121/79  Pulse: 73 72  Resp: 16 14  Temp:    SpO2: 99% 99%    Last Pain:  Vitals:   08/02/17 1015  TempSrc:   PainSc: 3    Pain Goal:                 [name]

## 2017-08-02 NOTE — Anesthesia Postprocedure Evaluation (Signed)
Anesthesia Post Note  Patient: Dorothy Cisneros  Procedure(s) Performed: Repeat CESAREAN SECTION (N/A )     Patient location during evaluation: Mother Baby Anesthesia Type: Spinal Level of consciousness: awake Pain management: pain level controlled Vital Signs Assessment: post-procedure vital signs reviewed and stable Respiratory status: spontaneous breathing Cardiovascular status: stable Postop Assessment: no headache, no backache, spinal receding and patient able to bend at knees Anesthetic complications: no    Last Vitals:  Vitals:   08/02/17 1148 08/02/17 1300  BP: 125/75 129/68  Pulse: 72 80  Resp: 18 16  Temp: (!) 36.4 C 36.4 C  SpO2:  100%    Last Pain:  Vitals:   08/02/17 1300  TempSrc: Oral  PainSc:    Pain Goal:                 Dorothy Cisneros

## 2017-08-02 NOTE — Transfer of Care (Signed)
Immediate Anesthesia Transfer of Care Note  Patient: Dorothy Cisneros  Procedure(s) Performed: Repeat CESAREAN SECTION (N/A )  Patient Location: PACU  Anesthesia Type:Spinal  Level of Consciousness: awake, alert , oriented and patient cooperative  Airway & Oxygen Therapy: Patient Spontanous Breathing  Post-op Assessment: Report given to RN and Post -op Vital signs reviewed and stable  Post vital signs: Reviewed and stable  Last Vitals:  Vitals:   08/02/17 0602  BP: 128/85  Pulse: 98  Resp: 18  Temp: 36.9 C    Last Pain:  Vitals:   08/02/17 0602  TempSrc: Oral         Complications: No apparent anesthesia complications

## 2017-08-02 NOTE — Op Note (Signed)
08/02/2017  8:54 AM  PATIENT:  Dorothy Cisneros  36 y.o. female  PRE-OPERATIVE DIAGNOSIS:  Previous Cesarean Section  POST-OPERATIVE DIAGNOSIS:  Previous Cesarean Section  PROCEDURE:  Procedure(s) with comments: Repeat CESAREAN SECTION (N/A) - EDD: 08/09/17 Allergy: Shellfish Dorothy Cisneros, RNFA  LTCS with 2 layer closure Scar revision  SURGEON:  Surgeon(s) and Role:    * Aloha Gell, MD - Primary  PHYSICIAN ASSISTANT:   ASSISTANTS: RFNA   ANESTHESIA:   spinal  EBL:  462 mL   BLOOD ADMINISTERED:none  DRAINS: Urinary Catheter (Foley)   LOCAL MEDICATIONS USED:  OTHER 40 units Kenalog mixed with 10cc 1% plain lidocaine  SPECIMEN:  Source of Specimen:  Keloid scar, placenta  DISPOSITION OF SPECIMEN:  L&D  COUNTS:  YES  TOURNIQUET:  * No tourniquets in log *  DICTATION: .Note written in EPIC  PLAN OF CARE: Admit to inpatient   PATIENT DISPOSITION:  PACU - hemodynamically stable.   Delay start of Pharmacological VTE agent (>24hrs) due to surgical blood loss or risk of bleeding: yes    Findings:  @BABYSEXEBC @ infant,  APGAR (1 MIN):   APGAR (5 MINS):   APGAR (10 MINS):   normal uterus with several sub-1cm ant wall fibroids, 4cm SS fibroid in the left cornua abutting the edge of the L fallopian tube, tubes and ovaries, normal placenta. 3VC, clear amniotic fluid Thick keloid pfanensteil incision  EBL: 256 cc Antibiotics:   2g Ancef Complications: none  Indications: This is a 36 y.o. year-old, G2P1  [name] admitted for RCS. Risks benefits and alternatives of the procedure were discussed with the patient who agreed to proceed  Procedure:  After informed consent was obtained the patient was taken to the operating room where spinal anesthesia was initiated.  She was prepped and draped in the normal sterile fashion in dorsal supine position with a leftward tilt.  A foley catheter was in place. The old keloid scar was removed sharply with a scalpel. The Pfannenstiel  skin incision was then carried down with the Bovie cautery until the fascia was reached. The fascia was incised in the midline. The incision was extended laterally with the Mayo scissors. The inferior aspect of the fascial incision was grasped with the Coker clamps, elevated up and the underlying rectus muscles were dissected off sharply. The superior aspect of the fascial incision was grasped with the Coker clamps elevated up and the underlying rectus muscles were dissected off sharply.  The peritoneum was entered sharply. The peritoneal incision was extended superiorly and inferiorly with good visualization of the bladder. The bladder blade was inserted and palpation was done to assess the fetal position and the location of the uterine vessels. The lower segment of the uterus was incised sharply with the scalpel and extended  bluntly in the cephalo-caudal fashion. The infant was grasped, brought to the incision,  rotated and the infant was delivered with fundal pressure. The nose and mouth were bulb suctioned. The cord was clamped and cut after 1 minute delay. The infant was handed off to the waiting pediatrician. The placenta was expressed. The uterus was exteriorized. The uterus was cleared of all clots and debris. The uterine incision was repaired with 0 Vicryl in a running locked fashion.  A second layer of the same suture was used in an imbricating fashion to obtain excellent hemostasis.  The uterus was then returned to the abdomen, the gutters were cleared of all clots and debris. The uterine incision was reinspected and found to  be hemostatic. The peritoneum was grasped and closed with 2-0 Vicryl in a running fashion. The cut muscle edges and the underside of the fascia were inspected and found to be hemostatic. The fascia was closed with 0 Vicryl in a single layer . The subcutaneous tissue was irrigated. Scarpa's layer was closed with a 2-0 plain gut suture. The skin was closed with a 4-0 Monocryl in a  single layer. The patient tolerated the procedure well. Sponge lap and needle counts were correct x3 and patient was taken to the recovery room in a stable condition.  Dorothy Cisneros A. 08/02/2017 8:56 AM

## 2017-08-02 NOTE — Addendum Note (Signed)
Addendum  created 08/02/17 1308 by Georgeanne Nim, CRNA   Sign clinical note

## 2017-08-02 NOTE — Addendum Note (Signed)
Addendum  created 08/02/17 1222 by Georgeanne Nim, CRNA   Intraprocedure Staff edited

## 2017-08-03 LAB — CBC
HCT: 35.1 % — ABNORMAL LOW (ref 36.0–46.0)
Hemoglobin: 12.1 g/dL (ref 12.0–15.0)
MCH: 27.6 pg (ref 26.0–34.0)
MCHC: 34.5 g/dL (ref 30.0–36.0)
MCV: 80.1 fL (ref 78.0–100.0)
PLATELETS: 287 10*3/uL (ref 150–400)
RBC: 4.38 MIL/uL (ref 3.87–5.11)
RDW: 15.5 % (ref 11.5–15.5)
WBC: 17.4 10*3/uL — ABNORMAL HIGH (ref 4.0–10.5)

## 2017-08-03 NOTE — Progress Notes (Signed)
Post Partum Day 1 Subjective: no complaints, up ad lib, voiding, tolerating PO and + flatus  Objective: Blood pressure 123/78, pulse 70, temperature 98 F (36.7 C), temperature source Oral, resp. rate 16, height 5\' 4"  (1.626 m), weight 96.2 kg (212 lb), last menstrual period 10/04/2016, SpO2 100 %, currently breastfeeding.  Physical Exam:  General: alert, cooperative and appears stated age Lochia: appropriate Uterine Fundus: firm Incision: healing well, no significant drainage DVT Evaluation: No evidence of DVT seen on physical exam. Negative Homan's sign.  Recent Labs    08/01/17 1040 08/03/17 0511  HGB 13.3 12.1  HCT 38.0 35.1*    Assessment/Plan: Stable POD 1 OOB Circumcision prior to discharge   LOS: 1 day   Horris Speros J 08/03/2017, 9:42 AM

## 2017-08-03 NOTE — Lactation Note (Signed)
This note was copied from a baby's chart. Lactation Consultation Note  Patient Name: Dorothy Cisneros Date: 08/03/2017 Reason for consult: Follow-up assessment;Hyperbilirubinemia  Follow up visit at 30 hours of age. Baby has had 5 voids, 5 stools and 8+ breast feedings in the past 24 hours. Mom reports phototherapy started around noon at last feeding, but baby was more sleepy.   Mom is able to hand express drops of colostrum.  Lc assisted with positioning.  Mom wants to sit at edge of bed.  Lc encouraged back support and mom declines.    Mom attempted to latch baby in cradle hold. LC noted baby turns head to left, Lc encouraged body alignment.  Mom continues to latch baby shallow with instructions and assist.  Baby is sleepy and sucks a few times and stops. Lc offered to assist with hand expression and spoon feeding.  Mom continues to allow baby to hold nipple in mouth and not suck. Lc offered to provide mom with a hand pump with help with supply, mom requests a DEBP and has one for home as well.    Robinson set up DEBP with instructions and mom began to pump.  Mom reports she used a #30 flange with older child and appears to fit well with #27 at this time.  If mom is uncomfortable or wants to increase in size she will ask LC or RN. Lc encouraged mom to work on hand expression after pumping to collect EBM for baby and explained she may only see drops with DEBP. Lc discussed sometimes baby becomes more sleepy due to bili levels increasing and mom should wake baby and offer spoon feedings as needed.     Maternal Data Has patient been taught Hand Expression?: Yes Does the patient have breastfeeding experience prior to this delivery?: Yes  Feeding Feeding Type: Breast Fed Length of feed: (few minutes)  LATCH Score Latch: Repeated attempts needed to sustain latch, nipple held in mouth throughout feeding, stimulation needed to elicit sucking reflex.  Audible Swallowing: None  Type of Nipple:  Everted at rest and after stimulation  Comfort (Breast/Nipple): Soft / non-tender  Hold (Positioning): Assistance needed to correctly position infant at breast and maintain latch.  LATCH Score: 6  Interventions Interventions: Breast feeding basics reviewed;Support pillows;Assisted with latch;Position options;Skin to skin;Expressed milk;Breast massage;Hand express;DEBP;Breast compression;Adjust position  Lactation Tools Discussed/Used Tools: Pump Pump Review: Setup, frequency, and cleaning;Milk Storage Initiated by:: JS IBCLC Date initiated:: 08/03/17   Consult Status Consult Status: Follow-up Date: 08/04/17 Follow-up type: In-patient    Malena Edman 08/03/2017, 3:05 PM

## 2017-08-03 NOTE — Plan of Care (Signed)
Progressing appropriately.

## 2017-08-04 ENCOUNTER — Other Ambulatory Visit: Payer: Self-pay

## 2017-08-04 NOTE — Progress Notes (Signed)
Pt has been ambulating in the halls this morning, and has had bm today. Dorothy Cisneros

## 2017-08-04 NOTE — Lactation Note (Addendum)
This note was copied from a baby's chart. Lactation Consultation Note  Patient Name: Dorothy Cisneros [id number] Date: 08/04/2017 Reason for consult: Follow-up assessment  Baby 85 hours old. Phototherapy lights just removed. Mom reports that she just fed baby 45 ml of formula from same bottle that she offered at last feeding--and baby had a large emesis. Discussed pouring formula into separate bottle--taken from mom's pumping kit--and then using remaining formula for next feeding--if within 3-4 hours. Brought another bottle to mom's room for next feeding. Mom states that she is putting baby to breast first with each feeding and then supplementing. So enc mom to give up to 25 or 30 ml for this third day of baby's life. Mom states that she has not pumped very much today.  Enc mom to put baby to breast with cues, then supplement with EBM/formula according to guidelines and then post-pump. Enc mom to call out for assistance with latching baby when baby cueing to nurse. Discussed assessment and interventions with Colletta Maryland, RN.   Maternal Data    Feeding Feeding Type: Bottle Fed - Formula Nipple Type: Slow - flow Length of feed: 15 min  LATCH Score                   Interventions    Lactation Tools Discussed/Used Tools: Pump Breast pump type: Double-Electric Breast Pump   Consult Status Consult Status: Follow-up Date: 08/05/17 Follow-up type: In-patient    Andres Labrum 08/04/2017, 2:51 PM

## 2017-08-04 NOTE — Plan of Care (Signed)
Progressing appropriately.

## 2017-08-04 NOTE — Progress Notes (Signed)
Subjective: POD# 2 Information for the patient's newborn:  Dorothy Cisneros, Dorothy Cisneros [[id number]]  female    circ planned Baby name: Dorothy Cisneros Bili lights  Reports feeling sore but well. Feeding: breast Patient reports tolerating PO.  Breast symptoms: + colostrum Pain controlled withPO meds Denies HA/SOB/C/P/N/V/dizziness. Flatus present. She reports vaginal bleeding as normal, without clots.  She is ambulating, urinating without difficulty.     Objective:   VS:    Vitals:   08/03/17 0230 08/03/17 0546 08/03/17 1812 08/04/17 0525  BP:  123/78 123/72 111/64  Pulse:  70 82 75  Resp:  16 17 18   Temp:  98 F (36.7 C) 98 F (36.7 C) (!) 97.5 F (36.4 C)  TempSrc:  Oral Oral Oral  SpO2: 98% 100%    Weight:      Height:          Intake/Output Summary (Last 24 hours) at 08/04/2017 0943 Last data filed at 08/03/2017 1057 Gross per 24 hour  Intake -  Output 900 ml  Net -900 ml        Recent Labs    08/01/17 1040 08/03/17 0511  WBC 11.1* 17.4*  HGB 13.3 12.1  HCT 38.0 35.1*  PLT 307 287     Blood type: --/--/O POS (11/07 1040)  Rubella: Immune (04/25 0000)     Physical Exam:  General: alert, cooperative and no distress Abdomen: soft, nontender, normal bowel sounds Incision: incision healing well, HC dressing disrupted, replaced with new one Uterine Fundus: firm, below umbilicus, nontender Lochia: minimal Ext: + 1 pedal edema, no redness or tenderness in the calves or thighs      Assessment/Plan: 36 y.o.   POD# 2. [id number]                  Active Problems:   Postpartum care following cesarean delivery (10/08)   Status post repeat low transverse cesarean section   Doing well, stable.               Advance diet as tolerated Encourage rest when baby rests Breastfeeding support Encourage to ambulate, warm fluids for gut motility  Routine post-op care Anticipate DC in am   [name], CNM, MSN 08/04/2017, 9:43 AM

## 2017-08-05 MED ORDER — SENNOSIDES-DOCUSATE SODIUM 8.6-50 MG PO TABS: 2.0000 | ORAL_TABLET | ORAL | Status: DC

## 2017-08-05 MED ORDER — OXYCODONE-ACETAMINOPHEN 5-325 MG PO TABS: 1.0000 | ORAL_TABLET | ORAL | 0 refills | Status: DC | PRN

## 2017-08-05 MED ORDER — ACETAMINOPHEN 325 MG PO TABS
650.0000 mg | ORAL_TABLET | ORAL | Status: DC | PRN
Start: 2017-08-05 — End: 2019-09-17

## 2017-08-05 MED ORDER — COCONUT OIL OIL: 1.0000 "application " | TOPICAL_OIL | 0 refills | Status: DC | PRN

## 2017-08-05 MED ORDER — SIMETHICONE 80 MG PO CHEW: 80.0000 mg | CHEWABLE_TABLET | Freq: Three times a day (TID) | ORAL | 0 refills | Status: DC

## 2017-08-05 MED ORDER — IBUPROFEN 600 MG PO TABS: 600.0000 mg | ORAL_TABLET | Freq: Four times a day (QID) | ORAL | 0 refills | Status: DC

## 2017-08-05 NOTE — Lactation Note (Signed)
This note was copied from a baby's chart. Lactation Consultation Note  Patient Name: Dorothy Cisneros [id number] Date: 08/05/2017 Reason for consult: Follow-up assessment  Baby 2 hours old and sleeping after circ. Mother is breastfeeding and formula feeding. Reminded mother to breastfeeding before offering formula to help establish her milk supply. Mom encouraged to feed baby 8-12 times/24 hours and with feeding cues.  Reviewed engorgement care and monitoring voids/stools. Mother denies questions or concerns.  Maternal Data    Feeding Feeding Type: Breast Fed Length of feed: 5 min  LATCH Score                   Interventions    Lactation Tools Discussed/Used     Consult Status Consult Status: Complete    [name] 08/05/2017, 12:41 PM

## 2017-08-05 NOTE — Discharge Summary (Signed)
OB Discharge Summary     Patient Name: Dorothy Cisneros DOB: [birth year] MRN: [id number]  Date of admission: 08/02/2017 Delivering MD: Aloha Gell   Date of discharge: 08/05/2017  Admitting diagnosis: Previous Cesarean Section Intrauterine pregnancy: [redacted]w[redacted]d     Secondary diagnosis:  Active Problems:   Postpartum care following cesarean delivery (10/08)   Status post repeat low transverse cesarean section   Discharge diagnosis: Term Pregnancy Delivered                                                                                                Complications: None  Hospital course:  Sceduled C/S   36 y.o. yo G2P2002 at [redacted]w[redacted]d was admitted to the hospital 08/02/2017 for scheduled cesarean section with the following indication:Elective Repeat.  Membrane Rupture Time/Date: 8:17 AM ,08/02/2017   Patient delivered a Viable infant.08/02/2017  Details of operation can be found in separate operative note.  Pateint had an uncomplicated postpartum course.  She is ambulating, tolerating a regular diet, passing flatus, and urinating well. Patient is discharged home in stable condition on  08/05/17         Physical exam  Vitals:   08/03/17 1812 08/04/17 0525 08/04/17 1900 08/05/17 0551  BP: 123/72 111/64 (!) 143/74 134/84  Pulse: 82 75 78 75  Resp: 17 18 20 18   Temp: 98 F (36.7 C) (!) 97.5 F (36.4 C) 98.5 F (36.9 C) 97.8 F (36.6 C)  TempSrc: Oral Oral Oral Oral  SpO2:      Weight:      Height:       General: alert, cooperative and no distress Lochia: appropriate Uterine Fundus: firm Incision: No significant erythema, Dressing is clean, dry, and intact DVT Evaluation: No cords or calf tenderness. No significant calf/ankle edema. Labs: Lab Results  Component Value Date   WBC 17.4 (H) 08/03/2017   HGB 12.1 08/03/2017   HCT 35.1 (L) 08/03/2017   MCV 80.1 08/03/2017   PLT 287 08/03/2017   No flowsheet data found.  Discharge instruction: per After Visit Summary and "Baby  and Me Booklet".  After visit meds:  Allergies as of 08/05/2017      Reactions   Crab [shellfish Allergy] Anaphylaxis, Other (See Comments)   Tongue swelling      Medication List    STOP taking these medications   aspirin EC 81 MG tablet   hydrochlorothiazide 25 MG tablet Commonly known as:  HYDRODIURIL     TAKE these medications   acetaminophen 325 MG tablet Commonly known as:  TYLENOL Take 2 tablets (650 mg total) every 4 (four) hours as needed by mouth (for pain scale < 4).   amoxicillin-clavulanate 875-125 MG tablet Commonly known as:  AUGMENTIN Take 1 tablet by mouth 2 (two) times daily.   coconut oil Oil Apply 1 application as needed topically.   ibuprofen 600 MG tablet Commonly known as:  ADVIL,MOTRIN Take 1 tablet (600 mg total) every 6 (six) hours by mouth.   olopatadine 0.1 % ophthalmic solution Commonly known as:  PATANOL Place one drop into both eyes 2 (two) times daily.  oxyCODONE-acetaminophen 5-325 MG tablet Commonly known as:  PERCOCET/ROXICET Take 1 tablet every 4 (four) hours as needed by mouth (pain scale 4-7).   pantoprazole 20 MG tablet Commonly known as:  PROTONIX Take 20 mg by mouth daily as needed for heartburn or indigestion.   prenatal multivitamin Tabs tablet Take 1 tablet by mouth daily at 12 noon.   ranitidine 300 MG tablet Commonly known as:  ZANTAC Take 300 mg by mouth 2 (two) times daily as needed for heartburn.   senna-docusate 8.6-50 MG tablet Commonly known as:  Senokot-S Take 2 tablets daily by mouth. Start taking on:  08/06/2017   simethicone 80 MG chewable tablet Commonly known as:  MYLICON Chew 1 tablet (80 mg total) 3 (three) times daily after meals by mouth.       Diet: routine diet  Activity: Advance as tolerated. Pelvic rest for 6 weeks.   Outpatient follow up:6 weeks  Postpartum contraception: Not Discussed  Newborn Data: Live born female Dorothy Cisneros Birth Weight: 7 lb 8.6 oz (3420 g) APGAR: 9,  9  Newborn Delivery   Birth date/time:  08/02/2017 08:17:00 Delivery type:  C-Section, Low Transverse C-section categorization:  Repeat     Baby Feeding: Breast Disposition:home with mother   08/05/2017 Dorothy Cisneros, CNM

## 2017-08-05 NOTE — Discharge Instructions (Signed)
Vitex capsule [phone] mg daily. May use tea or tincture form if available to help decrease uterine fibroids.Start 2 weeks postpartum. Do not take in combination with oral contraceptives.

## 2017-08-06 MED FILL — IBUPROFEN 600 MG TABLET: 600 | 7 days supply | Qty: 30 | Fill #0

## 2018-02-25 MED FILL — AZITHROMYCIN 250 MG TABLET: 250 | 5 days supply | Qty: 6 | Fill #0

## 2018-02-25 MED FILL — BENZONATATE 100 MG CAPS: 100 | 13 days supply | Qty: 40 | Fill #0

## 2018-02-26 MED FILL — AMOX-CLAV 875-125 MG TABLET: 875-125 | 10 days supply | Qty: 20 | Fill #0

## 2018-03-08 MED FILL — QSYMIA 3.75 MG-23 MG CAP: 3.75-23 | 14 days supply | Qty: 14 | Fill #0

## 2018-04-01 MED FILL — QSYMIA 7.5 MG-46 MG CAPSULE: 7.5-46 | 30 days supply | Qty: 30 | Fill #0

## 2018-04-29 MED FILL — QSYMIA 7.5 MG-46 MG CAPSULE: 7.5-46 | 30 days supply | Qty: 30 | Fill #0

## 2018-06-27 MED FILL — QSYMIA 7.5 MG-46 MG CAPSULE: 7.5-46 | 30 days supply | Qty: 30 | Fill #1

## 2018-12-27 MED FILL — OLOPATADINE HCL 0.1 % SOLN: 0.1 | 25 days supply | Qty: 5 | Fill #0

## 2019-04-21 ENCOUNTER — Other Ambulatory Visit: Payer: Self-pay

## 2019-04-21 DIAGNOSIS — Z20822 Contact with and (suspected) exposure to covid-19: Secondary | ICD-10-CM

## 2019-04-21 MED FILL — QSYMIA 3.75 MG-23 MG CAP: 3.75-23 | 14 days supply | Qty: 14 | Fill #0

## 2019-04-23 LAB — NOVEL CORONAVIRUS, NAA: SARS-CoV-2, NAA: NOT DETECTED

## 2019-05-07 MED FILL — QSYMIA 7.5 MG-46 MG CAPSULE: 7.5-46 | 30 days supply | Qty: 30 | Fill #0

## 2019-05-15 MED FILL — AZITHROMYCIN 250 MG TABLET: 250 | 5 days supply | Qty: 6 | Fill #0

## 2019-07-23 MED FILL — QSYMIA 7.5 MG-46 MG CAPSULE: 7.5-46 | 30 days supply | Qty: 30 | Fill #0

## 2019-08-25 MED FILL — QSYMIA 7.5 MG-46 MG CAPSULE: 7.5-46 | 30 days supply | Qty: 30 | Fill #0

## 2019-09-04 MED FILL — ALPRAZolam 1 MG TABS: 1 | 6 days supply | Qty: 20 | Fill #0

## 2019-09-04 MED FILL — PROMETHAZINE 25 MG TABLET: 25 | 5 days supply | Qty: 15 | Fill #0

## 2019-09-04 MED FILL — CEPHALEXIN 500 MG CAPSULE: 500 | 10 days supply | Qty: 40 | Fill #0

## 2019-09-04 MED FILL — OXYCODONE-ACETAMINOPHEN 5-3: 5-325 | 4 days supply | Qty: 24 | Fill #0

## 2019-09-11 MED FILL — SSD 1% CREAM: 1 | 30 days supply | Qty: 400 | Fill #0

## 2019-09-17 ENCOUNTER — Inpatient Hospital Stay (HOSPITAL_COMMUNITY)
Admission: EM | Admit: 2019-09-17 | Discharge: 2019-09-19 | DRG: 603 | Disposition: A | Discharge status: Home / Self Care | Payer: BC Managed Care – PPO | Attending: General Surgery | Admitting: General Surgery

## 2019-09-17 ENCOUNTER — Other Ambulatory Visit: Payer: Self-pay

## 2019-09-17 ENCOUNTER — Emergency Department (HOSPITAL_COMMUNITY): Payer: BC Managed Care – PPO

## 2019-09-17 DIAGNOSIS — Z820 Family history of epilepsy and other diseases of the nervous system: Secondary | ICD-10-CM

## 2019-09-17 DIAGNOSIS — D573 Sickle-cell trait: Secondary | ICD-10-CM | POA: Diagnosis present

## 2019-09-17 DIAGNOSIS — L03311 Cellulitis of abdominal wall: Secondary | ICD-10-CM | POA: Diagnosis not present

## 2019-09-17 DIAGNOSIS — T8332XA Displacement of intrauterine contraceptive device, initial encounter: Secondary | ICD-10-CM

## 2019-09-17 DIAGNOSIS — K219 Gastro-esophageal reflux disease without esophagitis: Secondary | ICD-10-CM | POA: Diagnosis present

## 2019-09-17 DIAGNOSIS — Z803 Family history of malignant neoplasm of breast: Secondary | ICD-10-CM

## 2019-09-17 DIAGNOSIS — Z91013 Allergy to seafood: Secondary | ICD-10-CM

## 2019-09-17 DIAGNOSIS — Z8249 Family history of ischemic heart disease and other diseases of the circulatory system: Secondary | ICD-10-CM

## 2019-09-17 DIAGNOSIS — L7682 Other postprocedural complications of skin and subcutaneous tissue: Secondary | ICD-10-CM

## 2019-09-17 DIAGNOSIS — Z833 Family history of diabetes mellitus: Secondary | ICD-10-CM

## 2019-09-17 DIAGNOSIS — Z9889 Other specified postprocedural states: Secondary | ICD-10-CM

## 2019-09-17 DIAGNOSIS — Z79891 Long term (current) use of opiate analgesic: Secondary | ICD-10-CM

## 2019-09-17 DIAGNOSIS — Z79899 Other long term (current) drug therapy: Secondary | ICD-10-CM

## 2019-09-17 DIAGNOSIS — Z20828 Contact with and (suspected) exposure to other viral communicable diseases: Secondary | ICD-10-CM | POA: Diagnosis present

## 2019-09-17 DIAGNOSIS — Z823 Family history of stroke: Secondary | ICD-10-CM

## 2019-09-17 DIAGNOSIS — Y762 Prosthetic and other implants, materials and accessory obstetric and gynecological devices associated with adverse incidents: Secondary | ICD-10-CM | POA: Diagnosis present

## 2019-09-17 LAB — COMPREHENSIVE METABOLIC PANEL
ALT: 22 U/L (ref 0–44)
AST: 20 U/L (ref 15–41)
Albumin: 3 g/dL — ABNORMAL LOW (ref 3.5–5.0)
Alkaline Phosphatase: 37 U/L — ABNORMAL LOW (ref 38–126)
Anion gap: 8 (ref 5–15)
BUN: 5 mg/dL — ABNORMAL LOW (ref 6–20)
CO2: 23 mmol/L (ref 22–32)
Calcium: 8.4 mg/dL — ABNORMAL LOW (ref 8.9–10.3)
Chloride: 109 mmol/L (ref 98–111)
Creatinine, Ser: 0.74 mg/dL (ref 0.44–1.00)
GFR calc Af Amer: >60 mL/min (ref >60)
GFR calc non Af Amer: >60 mL/min (ref >60)
Glucose, Bld: 94 mg/dL (ref 70–99)
Potassium: 3.9 mmol/L (ref 3.5–5.1)
Sodium: 140 mmol/L (ref 135–145)
Total Bilirubin: 0.6 mg/dL (ref 0.3–1.2)
Total Protein: 5.8 g/dL — ABNORMAL LOW (ref 6.5–8.1)

## 2019-09-17 LAB — CBC WITH DIFFERENTIAL/PLATELET
Abs Immature Granulocytes: 0 10*3/uL (ref 0.00–0.07)
Basophils Absolute: 0.1 10*3/uL (ref 0.0–0.1)
Basophils Relative: 2 %
Eosinophils Absolute: 0.2 10*3/uL (ref 0.0–0.5)
Eosinophils Relative: 4 %
HCT: 35 % — ABNORMAL LOW (ref 36.0–46.0)
Hemoglobin: 11.8 g/dL — ABNORMAL LOW (ref 12.0–15.0)
Lymphocytes Relative: 32 %
Lymphs Abs: 1.9 10*3/uL (ref 0.7–4.0)
MCH: 28.9 pg (ref 26.0–34.0)
MCHC: 33.7 g/dL (ref 30.0–36.0)
MCV: 85.6 fL (ref 80.0–100.0)
Monocytes Absolute: 0.6 10*3/uL (ref 0.1–1.0)
Monocytes Relative: 11 %
Neutro Abs: 3 10*3/uL (ref 1.7–7.7)
Neutrophils Relative %: 51 %
Platelets: 338 10*3/uL (ref 150–400)
RBC: 4.09 MIL/uL (ref 3.87–5.11)
RDW: 12.7 % (ref 11.5–15.5)
WBC: 5.8 10*3/uL (ref 4.0–10.5)
nRBC: 0 % (ref 0.0–0.2)
nRBC: 0 /100 WBC

## 2019-09-17 LAB — RESPIRATORY PANEL BY RT PCR (FLU A&B, COVID)
Influenza A by PCR: NEGATIVE
Influenza B by PCR: NEGATIVE
SARS Coronavirus 2 by RT PCR: NEGATIVE

## 2019-09-17 LAB — URINALYSIS, ROUTINE W REFLEX MICROSCOPIC
Bilirubin Urine: NEGATIVE
Glucose, UA: NEGATIVE mg/dL
Hgb urine dipstick: NEGATIVE
Ketones, ur: 5 mg/dL — AB
Nitrite: NEGATIVE
Protein, ur: NEGATIVE mg/dL
Specific Gravity, Urine: >1.046 — ABNORMAL HIGH (ref 1.005–1.030)
pH: 6 (ref 5.0–8.0)

## 2019-09-17 LAB — I-STAT BETA HCG BLOOD, ED (MC, WL, AP ONLY): I-stat hCG, quantitative: <5 m[IU]/mL (ref <5)

## 2019-09-17 LAB — POC SARS CORONAVIRUS 2 AG -  ED: SARS Coronavirus 2 Ag: NEGATIVE

## 2019-09-17 LAB — LACTIC ACID, PLASMA: Lactic Acid, Venous: 0.8 mmol/L (ref 0.5–1.9)

## 2019-09-17 MED ORDER — ACETAMINOPHEN 500 MG PO TABS
1000.0000 mg | ORAL_TABLET | Freq: Once | ORAL | Status: AC
  Administered 2019-09-17: 1000 mg via ORAL
  Filled 2019-09-17: qty 2

## 2019-09-17 MED ORDER — POTASSIUM CHLORIDE IN NACL 20-0.9 MEQ/L-% IV SOLN
INTRAVENOUS | Status: DC
  Filled 2019-09-17 (×3): qty 1000

## 2019-09-17 MED ORDER — ENOXAPARIN SODIUM 40 MG/0.4ML ~~LOC~~ SOLN
40.0000 mg | Freq: Every day | SUBCUTANEOUS | Status: DC
  Filled 2019-09-17: qty 0.4

## 2019-09-17 MED ORDER — ACETAMINOPHEN 325 MG PO TABS
650.0000 mg | ORAL_TABLET | Freq: Four times a day (QID) | ORAL | Status: DC | PRN
  Administered 2019-09-18: 650 mg via ORAL
  Filled 2019-09-17: qty 2

## 2019-09-17 MED ORDER — DIPHENHYDRAMINE HCL 50 MG/ML IJ SOLN: 25.0000 mg | Freq: Four times a day (QID) | INTRAMUSCULAR | Status: DC | PRN

## 2019-09-17 MED ORDER — ACETAMINOPHEN 650 MG RE SUPP: 650.0000 mg | Freq: Four times a day (QID) | RECTAL | Status: DC | PRN

## 2019-09-17 MED ORDER — SODIUM CHLORIDE 0.9% FLUSH: 3.0000 mL | Freq: Once | INTRAVENOUS | Status: DC

## 2019-09-17 MED ORDER — SODIUM CHLORIDE 0.9 % IV BOLUS
1000.0000 mL | Freq: Once | INTRAVENOUS | Status: AC
  Administered 2019-09-17: 1000 mL via INTRAVENOUS

## 2019-09-17 MED ORDER — ONDANSETRON HCL 4 MG/2ML IJ SOLN
4.0000 mg | Freq: Four times a day (QID) | INTRAMUSCULAR | Status: DC | PRN
  Filled 2019-09-17: qty 2

## 2019-09-17 MED ORDER — ONDANSETRON 4 MG PO TBDP: 4.0000 mg | ORAL_TABLET | Freq: Four times a day (QID) | ORAL | Status: DC | PRN

## 2019-09-17 MED ORDER — SIMETHICONE 80 MG PO CHEW: 40.0000 mg | CHEWABLE_TABLET | Freq: Four times a day (QID) | ORAL | Status: DC | PRN

## 2019-09-17 MED ORDER — DIPHENHYDRAMINE HCL 25 MG PO CAPS: 25.0000 mg | ORAL_CAPSULE | Freq: Four times a day (QID) | ORAL | Status: DC | PRN

## 2019-09-17 MED ORDER — FENTANYL CITRATE (PF) 100 MCG/2ML IJ SOLN
25.0000 ug | INTRAMUSCULAR | Status: DC | PRN
  Administered 2019-09-17 (×2): 25 ug via INTRAVENOUS
  Filled 2019-09-17 (×5): qty 2

## 2019-09-17 MED ORDER — PIPERACILLIN-TAZOBACTAM 3.375 G IVPB
3.3750 g | Freq: Three times a day (TID) | INTRAVENOUS | Status: DC
  Administered 2019-09-18 – 2019-09-19 (×5): 3.375 g via INTRAVENOUS
  Filled 2019-09-17 (×5): qty 50

## 2019-09-17 MED ORDER — VANCOMYCIN HCL IN DEXTROSE 1-5 GM/200ML-% IV SOLN
1000.0000 mg | Freq: Two times a day (BID) | INTRAVENOUS | Status: DC
  Administered 2019-09-17: 1000 mg via INTRAVENOUS
  Filled 2019-09-17 (×2): qty 200

## 2019-09-17 MED ORDER — OXYCODONE HCL 5 MG PO TABS: 5.0000 mg | ORAL_TABLET | ORAL | Status: DC | PRN

## 2019-09-17 MED ORDER — PIPERACILLIN-TAZOBACTAM 3.375 G IVPB 30 MIN
3.3750 g | Freq: Once | INTRAVENOUS | Status: AC
  Administered 2019-09-17: 3.375 g via INTRAVENOUS
  Filled 2019-09-17: qty 50

## 2019-09-17 MED ORDER — MORPHINE SULFATE (PF) 2 MG/ML IV SOLN: 2.0000 mg | INTRAVENOUS | Status: DC | PRN

## 2019-09-17 MED ORDER — CLINDAMYCIN PHOSPHATE 900 MG/50ML IV SOLN
900.0000 mg | Freq: Once | INTRAVENOUS | Status: AC
  Administered 2019-09-17: 900 mg via INTRAVENOUS
  Filled 2019-09-17: qty 50

## 2019-09-17 MED ORDER — SENNOSIDES-DOCUSATE SODIUM 8.6-50 MG PO TABS
2.0000 | ORAL_TABLET | ORAL | Status: DC
  Administered 2019-09-18 (×2): 2 via ORAL
  Filled 2019-09-17 (×2): qty 2

## 2019-09-17 MED ORDER — IOHEXOL 300 MG/ML  SOLN
100.0000 mL | Freq: Once | INTRAMUSCULAR | Status: AC | PRN
  Administered 2019-09-17: 100 mL via INTRAVENOUS

## 2019-09-17 NOTE — ED Provider Notes (Signed)
[place] EMERGENCY DEPARTMENT Provider Note   CSN: [id number] Arrival date & time: 09/17/19  1242     History Chief Complaint  Patient presents with  . Fever  . Chills  . Post-op Problem    Dorothy Cisneros is a 38 y.o. female with past medical history of sickle cell trait, who presents today for evaluation of fever.  She reports that on 12/10 she had a liposuction with abdominal skin resection at [facility] by Dr. Theda Sers.  She reports that she had a fever on 12/12/ but it went away.  She reports that since yesterday she has had fevers over 101.  She has been taking percocet for pain as needed, denies other ibuprofen or tylenol.    Chart review shows that she had a telemedicine visit with family medicine on 12/21.  According to those notes she has had wounds on her stomach since she took the dressings off.  She was given Silvadene cream which she started on 12/17 and nitroglycerin paste that she started on 12/19 for these areas.  She reports that she has been taking 500 mg Keflex 4 times a day since her surgery and denies missing doses.  She denies any cough or shortness of breath.  She reports that the majority of her abdomen anteriorly does not have normal sensation.  She states that the wounds do not have any sensation, and that the areas around the wounds the sensations that they do have are blunted and decreased.  She denies any known Covid-19 contacts.      HPI     Past Medical History:  Diagnosis Date  . AMA (advanced maternal age) multigravida 50+   . GERD (gastroesophageal reflux disease)   . Gestational edema affecting puerperium 06/08/2016  . History of gestational hypertension   . Postpartum care following cesarean delivery (9/12) 06/07/2016  . Postpartum care following cesarean delivery (9/12) 06/06/2016  . Postpartum care following cesarean delivery (9/13) 06/07/2016  . Sickle-cell trait [facility])     Patient Active Problem List   Diagnosis  Date Noted  . Status post repeat low transverse cesarean section 08/02/2017  . Postpartum care following cesarean delivery (10/08) 06/07/2016    Past Surgical History:  Procedure Laterality Date  . CESAREAN SECTION N/A 06/06/2016   Procedure: CESAREAN SECTION;  Surgeon: Aloha Gell, MD;  Location: [place];  Service: Obstetrics;  Laterality: N/A;  . CESAREAN SECTION N/A 08/02/2017   Procedure: Repeat CESAREAN SECTION;  Surgeon: Aloha Gell, MD;  Location: [place];  Service: Obstetrics;  Laterality: N/A;  EDD: 08/09/17 Allergy: Shellfish Heather K, RNFA     OB History    Gravida  2   Para  2   Term  2   Preterm      AB      Living  2     SAB      TAB      Ectopic      Multiple  0   Live Births  2           Family History  Problem Relation Age of Onset  . Hypertension Mother   . Stroke Mother   . Breast cancer Mother   . Heart failure Mother   . Diabetes Mother   . Breast cancer Maternal Aunt   . Alzheimer's disease Maternal Aunt   . Heart disease Maternal Grandmother   . Hypertension Maternal Grandmother   . Diabetes Maternal Grandmother     Social  History   Tobacco Use  . Smoking status: Never Smoker  . Smokeless tobacco: Never Used  Substance Use Topics  . Alcohol use: No  . Drug use: No    Home Medications Prior to Admission medications   Medication Sig Start Date End Date Taking? Authorizing Provider  acetaminophen (TYLENOL) 325 MG tablet Take 2 tablets (650 mg total) every 4 (four) hours as needed by mouth (for pain scale < 4). 08/05/17   [name], CNM  amoxicillin-clavulanate (AUGMENTIN) 875-125 MG tablet Take 1 tablet by mouth 2 (two) times daily. Patient not taking: Reported on 07/25/2017 10/08/16   Mayer Camel, MD  coconut oil OIL Apply 1 application as needed topically. 08/05/17   [name], CNM  ibuprofen (ADVIL,MOTRIN) 600 MG tablet Take 1 tablet (600 mg total) every 6 (six) hours by mouth.  08/05/17   [name], CNM  olopatadine (PATANOL) 0.1 % ophthalmic solution Place one drop into both eyes 2 (two) times daily. 01/09/17   [provider]  oxyCODONE-acetaminophen (PERCOCET/ROXICET) 5-325 MG tablet Take 1 tablet every 4 (four) hours as needed by mouth (pain scale 4-7). 08/05/17   [name], CNM  pantoprazole (PROTONIX) 20 MG tablet Take 20 mg by mouth daily as needed for heartburn or indigestion.    [provider]  Prenatal Vit-Fe Fumarate-FA (PRENATAL MULTIVITAMIN) TABS tablet Take 1 tablet by mouth daily at 12 noon.    [provider]  ranitidine (ZANTAC) 300 MG tablet Take 300 mg by mouth 2 (two) times daily as needed for heartburn.    [provider]  senna-docusate (SENOKOT-S) 8.6-50 MG tablet Take 2 tablets daily by mouth. 08/06/17   [name], CNM  simethicone (MYLICON) 80 MG chewable tablet Chew 1 tablet (80 mg total) 3 (three) times daily after meals by mouth. 08/05/17   [name], CNM    Allergies    Otho Darner allergy]  Review of Systems   Review of Systems  Constitutional: Positive for fever.  Gastrointestinal: Negative for abdominal pain, diarrhea, nausea and vomiting.  Musculoskeletal: Negative for back pain and neck pain.  Skin: Positive for color change and wound.  Neurological:       Decreased sensation over abdomen.   All other systems reviewed and are negative.   Physical Exam Updated Vital Signs BP 121/79 (BP Location: Left Arm)   Pulse 80   Temp 98.8 F (37.1 C) (Oral)   Resp 18   Ht 5\' 4"  (1.626 m)   Wt 86.6 kg   SpO2 99%   BMI 32.77 kg/m   Physical Exam Vitals and nursing note reviewed.  Constitutional:      General: She is not in acute distress.    Appearance: She is well-developed. She is not diaphoretic.  HENT:     Head: Normocephalic and atraumatic.  Eyes:     General: No scleral icterus.       Right eye: No discharge.        Left eye: No discharge.      Conjunctiva/sclera: Conjunctivae normal.  Cardiovascular:     Rate and Rhythm: Normal rate and regular rhythm.  Pulmonary:     Effort: Pulmonary effort is normal. No respiratory distress.     Breath sounds: No stridor.  Abdominal:     Tenderness: There is abdominal tenderness (Laterally over incision sites, no TTP anteriorly. ).  Musculoskeletal:        General: No deformity.     Cervical back:  Normal range of motion.  Skin:    General: Skin is warm and dry.     Comments: Please see clinical images.  There are multiple wounds over the abdomen and bilateral inguinal areas.  Wounds are concerning for eschars with no sensation to touch.  There are 2 noted areas of drainage from the wounds.  The surrounding area is mildly erythematous.  Generally superficial abdomen feels indurated. There is an area of serosanguineous drainage from the medial aspect of the superiormost eschar. There is a small area of drainage from the inferior aspect of the right sided eschar at the level of the umbilicus with brown drainage.  Neurological:     General: No focal deficit present.     Mental Status: She is alert.     Motor: No abnormal muscle tone.     Comments: Awake and alert, will to answer all questions appropriately.  Speech is not slurred.  Normal gait.  Psychiatric:        Mood and Affect: Mood normal.        Behavior: Behavior normal.            ED Results / Procedures / Treatments   Labs (all labs ordered are listed, but only abnormal results are displayed) Labs Reviewed  COMPREHENSIVE METABOLIC PANEL - Abnormal; Notable for the following components:      Result Value   BUN 5 (*)    Calcium 8.4 (*)    Total Protein 5.8 (*)    Albumin 3.0 (*)    Alkaline Phosphatase 37 (*)    All other components within normal limits  CBC WITH DIFFERENTIAL/PLATELET - Abnormal; Notable for the following components:   Hemoglobin 11.8 (*)    HCT 35.0 (*)    All other components within normal limits    URINALYSIS, ROUTINE W REFLEX MICROSCOPIC - Abnormal; Notable for the following components:   Specific Gravity, Urine >1.046 (*)    Ketones, ur 5 (*)    Leukocytes,Ua MODERATE (*)    Bacteria, UA RARE (*)    All other components within normal limits  CULTURE, BLOOD (ROUTINE X 2)  CULTURE, BLOOD (ROUTINE X 2)  AEROBIC CULTURE (SUPERFICIAL SPECIMEN)  RESPIRATORY PANEL BY RT PCR (FLU A&B, COVID)  LACTIC ACID, PLASMA  I-STAT BETA HCG BLOOD, ED ([facility], [facility], [facility] ONLY)  POC SARS CORONAVIRUS 2 AG -  ED    EKG None  Radiology CT Abdomen Pelvis W Contrast  Result Date: 09/17/2019 CLINICAL DATA:  Liposuction performed 09/04/2019, nonhealing wounds and cellulitis EXAM: CT ABDOMEN AND PELVIS WITH CONTRAST TECHNIQUE: Multidetector CT imaging of the abdomen and pelvis was performed using the standard protocol following bolus administration of intravenous contrast. CONTRAST:  129mL OMNIPAQUE IOHEXOL 300 MG/ML  SOLN COMPARISON:  None. FINDINGS: Lower chest: Lung bases are clear. Normal heart size. No pericardial effusion. Hepatobiliary: No focal liver abnormality is seen. No gallstones, gallbladder wall thickening, or biliary dilatation. Pancreas: Unremarkable. No pancreatic ductal dilatation or surrounding inflammatory changes. Spleen: Normal in size without focal abnormality. Adrenals/Urinary Tract: Adrenal glands are unremarkable. Left extrarenal pelvis. Kidneys are otherwise normal, without renal calculi, focal lesion, or hydronephrosis. Bladder is unremarkable. Stomach/Bowel: Distal esophagus, stomach and duodenal sweep are unremarkable. No small bowel wall thickening or dilatation. No evidence of obstruction. A normal appendix is visualized. No colonic dilatation or wall thickening. Vascular/Lymphatic: The aorta is normal caliber. No suspicious or enlarged lymph nodes in the included lymphatic chains. Reproductive: Anteverted uterus. Tiny fundal fibroid. Radiopaque IUD is malpositioned  with the right arm  extending proud of the serosal surface and into the peritoneum. Other: There is extensive overlying skin thickening and scattered sites of ulceration along the anterior abdomen as well as the lateral and posterior flanks with subjacent extensive fast stranding and loculated fluid collections some of which demonstrate some thin rim enhancement. A punctate focus of gas is seen within a collection along the right abdominal wall (2/44). Swelling and edema extends to the fascial planes of the rectus sheath. Musculoskeletal: Uniform enhancement of the abdominal musculature at this time. Inflammation in fluid seen tracking along the superficial fascial planes of the abdominal wall. No acute osseous abnormality or suspicious osseous lesion. IMPRESSION: 1. Extensive overlying skin thickening and scattered sites of ulceration along the anterior abdomen as well as the lateral and posterior flanks with subjacent extensive fast stranding and loculated fluid collections some of which demonstrate some thin rim enhancement. A punctate focus of gas is seen within a collection along the right abdominal wall (2/44). Findings are itis compatible with cellulitis and the presence of gas within the soft tissues given time since operation (13 days) is highly worrisome for underlying infection including potential necrotizing soft tissue infection. Urgent surgical consultation is warranted. 2. Swelling and edema extends to the fascial planes of the rectus sheath at this time. No intraperitoneal inflammation is seen. 3. Radiopaque IUD is malpositioned with the right arm extending proud of the serosal surface of the uterine fundus and extending into the peritoneum. Critical Value/emergent results were called by telephone at the time of interpretation on 09/17/2019 at 8:36 pm to [facility] , who verbally acknowledged these results. Electronically Signed   By: Lovena Le M.D.   On: 09/17/2019 20:36    Procedures .Critical  Care Performed by: Lorin Glass, PA-C Authorized by: Lorin Glass, PA-C   Critical care provider statement:    Critical care time (minutes):  45   Critical care time was exclusive of:  Separately billable procedures and treating other patients and teaching time   Critical care was time spent personally by me on the following activities:  Discussions with consultants, evaluation of patient's response to treatment, examination of patient, ordering and performing treatments and interventions, ordering and review of laboratory studies, ordering and review of radiographic studies, pulse oximetry, re-evaluation of patient's condition, obtaining history from patient or surrogate and review of old charts   (including critical care time)  Medications Ordered in ED Medications  sodium chloride flush (NS) 0.9 % injection 3 mL (0 mLs Intravenous Hold 09/17/19 2031)  fentaNYL (SUBLIMAZE) injection 25 mcg (25 mcg Intravenous Given 09/17/19 1700)  vancomycin (VANCOCIN) IVPB 1000 mg/200 mL premix (0 mg Intravenous Stopped 09/17/19 2149)  piperacillin-tazobactam (ZOSYN) IVPB 3.375 g (3.375 g Intravenous New Bag/Given 09/17/19 2243)    Followed by  piperacillin-tazobactam (ZOSYN) IVPB 3.375 g (has no administration in time range)  acetaminophen (TYLENOL) tablet 1,000 mg (1,000 mg Oral Given 09/17/19 1642)  sodium chloride 0.9 % bolus 1,000 mL (0 mLs Intravenous Stopped 09/17/19 2149)  iohexol (OMNIPAQUE) 300 MG/ML solution 100 mL (100 mLs Intravenous Contrast Given 09/17/19 2000)  clindamycin (CLEOCIN) IVPB 900 mg (0 mg Intravenous Stopped 09/17/19 2228)    ED Course  I have reviewed the triage vital signs and the nursing notes.  Pertinent labs & imaging results that were available during my care of the patient were reviewed by me and considered in my medical decision making (see chart for details).  Clinical Course as of Sep 24 2304  Wed Sep 17, 2019  2026 Loculated collections, not  fully rim enhancing Cellulitis, no soft tissue gas, or gross rim ehnancement   [EH]  2030 Spoke with Dr. Gershon Crane through his assistant.     [EH]  2031 Right arm of IUD is through the wall of the uterus.    [EH]  2112 Spoke with Dr. Benjie Karvonen from [facility] who recommends outpatient follow-up.  She will have one of the nursing staff follow patient.   [EH]  2158 Spoke with Dr. Gershon Crane of general surgery.  He is requesting I call him when covid test is back.    [EH]  2227 Spoke with Dr. Theda Sers who performed her surgery.  His cell phone number is [phone].  He is aware she has the lesions present.     [EH]    Clinical Course User Index [EH] Ollen Gross   MDM Rules/Calculators/A&P                     Patient is a 38 year old female postop day 76 from a abdominal liposuction and skin reduction at [facility] by Dr. Theda Sers who presents today for evaluation of fevers over the past 2 days.  Physical exam shows multiple large black wounds/eschars on the abdomen with drainage of brown fluid from one area.    She has been taking Tylenol and therefore is afebrile here, however she reports fevers over 101 at home.  She reports compliance with the Keflex that she has been on since her surgery.  She denies any known coronavirus contacts.  Labs obtained showing white count of 5.8, anemia of 11.8.    CT scan shows concern for necrotizing soft tissue infection and diffuse cellulitis.  CT scan also shows that the right arm of the IUD is through the wall of the uterus.    I spoke with OB/gyn Dr. Benjie Karvonen who will follow regarding the IUD.    Dr. Gershon Crane saw patient, will admit.   This patient was seen as a shared visit with Dr. Sedonia Small.    Patient remained hemodynamically stable while in my care.   Final Clinical Impression(s) / ED Diagnoses Final diagnoses:  Other postoperative complication of skin  Post-operative state  Cellulitis of abdominal wall  Malpositioned intrauterine  device (IUD), initial encounter    Rx / DC Orders ED Discharge Orders    None       Ollen Gross 09/17/19 2315    Maudie Flakes, MD 09/18/19 2059

## 2019-09-17 NOTE — ED Triage Notes (Signed)
Pt reports she had a liposuction and resection of the abdomen on 12/10, states she spiked a fever on 12/12 that went away and then had a fever again yesterday that she could not get to go away. States she is still on antibiotics from her surgery. Reports continued abdominal pain at surgical site. Denies cough or sob. No recent sick contacts.

## 2019-09-17 NOTE — H&P (Signed)
Dorothy Cisneros is an 38 y.o. female.   Chief Complaint: Fever and chills after recent plastic surgery HPI: This is a 38 year old female with sickle trait presents after recent plastic surgery on 09/04/19.  We have no records to review tonight, but the patient reports liposuction and limited abdominoplasty performed at [facility] in [place] by Dr. Suan Halter.  The patient began experiencing fever two days post-op and has been on Keflex since surgery with no missed doses.  She has had two days of fever over 101 and has been taking Percocet regularly.  Apparently, her PCP gave her Silvadene ointment to treat the discolored areas on her abdomen since 12/17.    She presented to the [facility] ED around 1 pm today complaining mainly of fever and chills.  The anterior abdomen is numb with decreased sensation around the wounds.  I was called around 9 pm while in the operating room.  Her COVID status is still pending.  The patient has been seeing [facility] in [place].  Apparently, there are no plastic surgeons on call tonight at [facility], so General Surgery was called.    Past Medical History:  Diagnosis Date  . AMA (advanced maternal age) multigravida 69+   . GERD (gastroesophageal reflux disease)   . Gestational edema affecting puerperium 06/08/2016  . History of gestational hypertension   . Postpartum care following cesarean delivery (9/12) 06/07/2016  . Postpartum care following cesarean delivery (9/12) 06/06/2016  . Postpartum care following cesarean delivery (9/13) 06/07/2016  . Sickle-cell trait [facility])     Past Surgical History:  Procedure Laterality Date  . CESAREAN SECTION N/A 06/06/2016   Procedure: CESAREAN SECTION;  Surgeon: Aloha Gell, MD;  Location: [place];  Service: Obstetrics;  Laterality: N/A;  . CESAREAN SECTION N/A 08/02/2017   Procedure: Repeat CESAREAN SECTION;  Surgeon: Aloha Gell, MD;  Location: [place];  Service: Obstetrics;   Laterality: N/A;  EDD: 08/09/17 Allergy: Shellfish Heather K, RNFA    Family History  Problem Relation Age of Onset  . Hypertension Mother   . Stroke Mother   . Breast cancer Mother   . Heart failure Mother   . Diabetes Mother   . Breast cancer Maternal Aunt   . Alzheimer's disease Maternal Aunt   . Heart disease Maternal Grandmother   . Hypertension Maternal Grandmother   . Diabetes Maternal Grandmother    Social History:  reports that she has never smoked. She has never used smokeless tobacco. She reports that she does not drink alcohol or use drugs.  Allergies:  Allergies  Allergen Reactions  . Crab [Shellfish Allergy] Anaphylaxis and Other (See Comments)    Tongue swelling   Prior to Admission medications   Medication Sig Start Date End Date Taking? Authorizing Provider  acetaminophen (TYLENOL) 325 MG tablet Take 2 tablets (650 mg total) every 4 (four) hours as needed by mouth (for pain scale < 4). 08/05/17   [name], CNM  amoxicillin-clavulanate (AUGMENTIN) 875-125 MG tablet Take 1 tablet by mouth 2 (two) times daily. Patient not taking: Reported on 07/25/2017 10/08/16   Mayer Camel, MD  coconut oil OIL Apply 1 application as needed topically. 08/05/17   [name], CNM  ibuprofen (ADVIL,MOTRIN) 600 MG tablet Take 1 tablet (600 mg total) every 6 (six) hours by mouth. 08/05/17   [name], CNM  olopatadine (PATANOL) 0.1 % ophthalmic solution Place one drop into both eyes 2 (two) times daily. 01/09/17  [provider]  oxyCODONE-acetaminophen (PERCOCET/ROXICET) 5-325 MG tablet Take 1 tablet every 4 (four) hours as needed by mouth (pain scale 4-7). 08/05/17   [name], CNM  pantoprazole (PROTONIX) 20 MG tablet Take 20 mg by mouth daily as needed for heartburn or indigestion.    [provider]  Prenatal Vit-Fe Fumarate-FA (PRENATAL MULTIVITAMIN) TABS tablet Take 1 tablet by mouth daily at 12 noon.    [provider]    ranitidine (ZANTAC) 300 MG tablet Take 300 mg by mouth 2 (two) times daily as needed for heartburn.    [provider]  senna-docusate (SENOKOT-S) 8.6-50 MG tablet Take 2 tablets daily by mouth. 08/06/17   [name], CNM  simethicone (MYLICON) 80 MG chewable tablet Chew 1 tablet (80 mg total) 3 (three) times daily after meals by mouth. 08/05/17   [name], CNM     Results for orders placed or performed during the hospital encounter of 09/17/19 (from the past 48 hour(s))  Lactic acid, plasma     Status: None   Collection Time: 09/17/19  1:02 PM  Result Value Ref Range   Lactic Acid, Venous 0.8 0.5 - 1.9 mmol/L    Comment: Performed at [facility], [place] [address] [place], [place] [address]  Comprehensive metabolic panel     Status: Abnormal   Collection Time: 09/17/19  1:02 PM  Result Value Ref Range   Sodium 140 135 - 145 mmol/L   Potassium 3.9 3.5 - 5.1 mmol/L   Chloride 109 98 - 111 mmol/L   CO2 23 22 - 32 mmol/L   Glucose, Bld 94 70 - 99 mg/dL   BUN 5 (L) 6 - 20 mg/dL   Creatinine, Ser 0.74 0.44 - 1.00 mg/dL   Calcium 8.4 (L) 8.9 - 10.3 mg/dL   Total Protein 5.8 (L) 6.5 - 8.1 g/dL   Albumin 3.0 (L) 3.5 - 5.0 g/dL   AST 20 15 - 41 U/L   ALT 22 0 - 44 U/L   Alkaline Phosphatase 37 (L) 38 - 126 U/L   Total Bilirubin 0.6 0.3 - 1.2 mg/dL   GFR calc non Af Amer >60 >60 mL/min   GFR calc Af Amer >60 >60 mL/min   Anion gap 8 5 - 15    Comment: Performed at [facility] [address], [place], [place] [address]  CBC with Differential     Status: Abnormal   Collection Time: 09/17/19  1:02 PM  Result Value Ref Range   WBC 5.8 4.0 - 10.5 K/uL   RBC 4.09 3.87 - 5.11 MIL/uL   Hemoglobin 11.8 (L) 12.0 - 15.0 g/dL   HCT 35.0 (L) 36.0 - 46.0 %   MCV 85.6 80.0 - 100.0 fL   MCH 28.9 26.0 - 34.0 pg   MCHC 33.7 30.0 - 36.0 g/dL   RDW 12.7 11.5 - 15.5 %   Platelets 338 150 - 400 K/uL   nRBC 0.0 0.0 - 0.2 %   Neutrophils Relative % 51 %   Neutro Abs  3.0 1.7 - 7.7 K/uL   Lymphocytes Relative 32 %   Lymphs Abs 1.9 0.7 - 4.0 K/uL   Monocytes Relative 11 %   Monocytes Absolute 0.6 0.1 - 1.0 K/uL   Eosinophils Relative 4 %   Eosinophils Absolute 0.2 0.0 - 0.5 K/uL   Basophils Relative 2 %   Basophils Absolute 0.1 0.0 - 0.1 K/uL   nRBC 0 0 /100 WBC  Abs Immature Granulocytes 0.00 0.00 - 0.07 K/uL    Comment: Performed at [facility], [place] [address], [place], [place] [address]  I-Stat beta hCG blood, ED     Status: None   Collection Time: 09/17/19  1:18 PM  Result Value Ref Range   I-stat hCG, quantitative <5.0 <5 mIU/mL   Comment 3            Comment:   GEST. AGE      CONC.  (mIU/mL)   <=1 WEEK        5 - 50     2 WEEKS       50 - 500     3 WEEKS       100 - 10,000     4 WEEKS     1,000 - 30,000        FEMALE AND NON-PREGNANT FEMALE:     LESS THAN 5 mIU/mL   POC SARS Coronavirus 2 Ag-ED - Nasal Swab (BD Veritor Kit)     Status: None   Collection Time: 09/17/19  7:01 PM  Result Value Ref Range   SARS Coronavirus 2 Ag NEGATIVE NEGATIVE    Comment: (NOTE) SARS-CoV-2 antigen NOT DETECTED.  Negative results are presumptive.  Negative results do not preclude SARS-CoV-2 infection and should not be used as the sole basis for treatment or other patient management decisions, including infection  control decisions, particularly in the presence of clinical signs and  symptoms consistent with COVID-19, or in those who have been in contact with the virus.  Negative results must be combined with clinical observations, patient history, and epidemiological information. The expected result is Negative. Fact Sheet for Patients: PodPark.tn Fact Sheet for Healthcare Providers: GiftContent.is This test is not yet approved or cleared by the [place] FDA and  has been authorized for detection and/or diagnosis of SARS-CoV-2 by FDA under an Emergency Use Authorization (EUA).  This  EUA will remain in effect (meaning this test can be used) for the duration of  the COVID-19 de claration under Section 564(b)(1) of the Act, 21 U.S.C. section 360bbb-3(b)(1), unless the authorization is terminated or revoked sooner.   Urinalysis, Routine w reflex microscopic     Status: Abnormal   Collection Time: 09/17/19 10:29 PM  Result Value Ref Range   Color, Urine YELLOW YELLOW   APPearance CLEAR CLEAR   Specific Gravity, Urine >1.046 (H) 1.005 - 1.030   pH 6.0 5.0 - 8.0   Glucose, UA NEGATIVE NEGATIVE mg/dL   Hgb urine dipstick NEGATIVE NEGATIVE   Bilirubin Urine NEGATIVE NEGATIVE   Ketones, ur 5 (A) NEGATIVE mg/dL   Protein, ur NEGATIVE NEGATIVE mg/dL   Nitrite NEGATIVE NEGATIVE   Leukocytes,Ua MODERATE (A) NEGATIVE   RBC / HPF 0-5 0 - 5 RBC/hpf   WBC, UA 21-50 0 - 5 WBC/hpf   Bacteria, UA RARE (A) NONE SEEN   Squamous Epithelial / LPF 6-10 0 - 5    Comment: Performed at [facility], [address] [place], [place] [address]   CT Abdomen Pelvis W Contrast  Result Date: 09/17/2019 CLINICAL DATA:  Liposuction performed 09/04/2019, nonhealing wounds and cellulitis EXAM: CT ABDOMEN AND PELVIS WITH CONTRAST TECHNIQUE: Multidetector CT imaging of the abdomen and pelvis was performed using the standard protocol following bolus administration of intravenous contrast. CONTRAST:  162m OMNIPAQUE IOHEXOL 300 MG/ML  SOLN COMPARISON:  None. FINDINGS: Lower chest: Lung bases are clear. Normal heart size. No pericardial effusion. Hepatobiliary:  No focal liver abnormality is seen. No gallstones, gallbladder wall thickening, or biliary dilatation. Pancreas: Unremarkable. No pancreatic ductal dilatation or surrounding inflammatory changes. Spleen: Normal in size without focal abnormality. Adrenals/Urinary Tract: Adrenal glands are unremarkable. Left extrarenal pelvis. Kidneys are otherwise normal, without renal calculi, focal lesion, or hydronephrosis. Bladder is unremarkable.  Stomach/Bowel: Distal esophagus, stomach and duodenal sweep are unremarkable. No small bowel wall thickening or dilatation. No evidence of obstruction. A normal appendix is visualized. No colonic dilatation or wall thickening. Vascular/Lymphatic: The aorta is normal caliber. No suspicious or enlarged lymph nodes in the included lymphatic chains. Reproductive: Anteverted uterus. Tiny fundal fibroid. Radiopaque IUD is malpositioned with the right arm extending proud of the serosal surface and into the peritoneum. Other: There is extensive overlying skin thickening and scattered sites of ulceration along the anterior abdomen as well as the lateral and posterior flanks with subjacent extensive fast stranding and loculated fluid collections some of which demonstrate some thin rim enhancement. A punctate focus of gas is seen within a collection along the right abdominal wall (2/44). Swelling and edema extends to the fascial planes of the rectus sheath. Musculoskeletal: Uniform enhancement of the abdominal musculature at this time. Inflammation in fluid seen tracking along the superficial fascial planes of the abdominal wall. No acute osseous abnormality or suspicious osseous lesion. IMPRESSION: 1. Extensive overlying skin thickening and scattered sites of ulceration along the anterior abdomen as well as the lateral and posterior flanks with subjacent extensive fast stranding and loculated fluid collections some of which demonstrate some thin rim enhancement. A punctate focus of gas is seen within a collection along the right abdominal wall (2/44). Findings are itis compatible with cellulitis and the presence of gas within the soft tissues given time since operation (13 days) is highly worrisome for underlying infection including potential necrotizing soft tissue infection. Urgent surgical consultation is warranted. 2. Swelling and edema extends to the fascial planes of the rectus sheath at this time. No intraperitoneal  inflammation is seen. 3. Radiopaque IUD is malpositioned with the right arm extending proud of the serosal surface of the uterine fundus and extending into the peritoneum. Critical Value/emergent results were called by telephone at the time of interpretation on 09/17/2019 at 8:36 pm to [facility] , who verbally acknowledged these results. Electronically Signed   By: Lovena Le M.D.   On: 09/17/2019 20:36    Review of Systems  Constitutional: Positive for fever.  Gastrointestinal: Negative for abdominal pain, diarrhea, nausea and vomiting.  Musculoskeletal: Negative for back pain and neck pain.  Skin: Positive for color change and wound.  Neurological:       Decreased sensation over abdomen.   All other systems reviewed and are negative  Blood pressure 121/79, pulse 80, temperature 98.8 F (37.1 C), temperature source Oral, resp. rate 18, height 5' 4"  (1.626 m), weight 86.6 kg, SpO2 99 %, currently breastfeeding. Physical Exam  WDWN in NAD Eyes:  Pupils equal, round; sclera anicteric HENT:  Oral mucosa moist; good dentition  Neck:  No masses palpated, no thyromegaly Lungs:  CTA bilaterally; normal respiratory effort CV:  Regular rate and rhythm; no murmurs; extremities well-perfused with no edema Abd:  +bowel sounds, obese; long transverse abdominoplasty incision - intact; no drainage Multiple patches of ischemic/ necrotic skin with some surrounding cellulitis.  Most of this is dry eschar. Skin:  Warm, dry; no sign of jaundice Psychiatric - alert and oriented x 4; calm mood and affect      Assessment/Plan  1.  Cellulitis/ skin necrosis after recent liposuction and abdominoplasty by Dr. Theda Sers of [place], [place] 2.  No signs of sepsis - currently afebrile, not tachycardic, normotensive, normal WBC.   Plan: No indications for emergent surgery. IV antibiotics Plastic Surgery evaluation in AM. If no improvement, may need debridement of her abdominal  wall.    Maia Petties, MD 09/17/2019, 10:57 PM

## 2019-09-17 NOTE — Progress Notes (Signed)
Pharmacy Antibiotic Note  Dorothy Cisneros is a 38 y.o. female admitted on 09/17/2019 with cellulitis.  Pharmacy has been consulted for Vancomycin dosing.  Height: 5\' 4"  (162.6 cm) Weight: 190 lb 14.7 oz (86.6 kg) IBW/kg (Calculated) : 54.7  Temp (24hrs), Avg:99.6 F (37.6 C), Min:99.6 F (37.6 C), Max:99.6 F (37.6 C)  Recent Labs  Lab 09/17/19 1302  WBC 5.8  CREATININE 0.74  LATICACIDVEN 0.8    Estimated Creatinine Clearance: 100.6 mL/min (by C-G formula based on SCr of 0.74 mg/dL).    Allergies  Allergen Reactions  . Crab [Shellfish Allergy] Anaphylaxis and Other (See Comments)    Tongue swelling    Antimicrobials this admission: 12/23 Vancomycin >>    Dose adjustments this admission:   Microbiology results: 12/23 BCx: Pending  Plan: - No vancomycin loading dose indicated for cellulitis  - Vancomycin 1000mg  IV q12h  - Monitor patient renal function and cultures     Thank you for allowing pharmacy to be a part of this patient's care.  Duanne Limerick PharmD. BCPS  09/17/2019 6:20 PM

## 2019-09-18 ENCOUNTER — Encounter (HOSPITAL_COMMUNITY): Payer: Self-pay

## 2019-09-18 DIAGNOSIS — Y762 Prosthetic and other implants, materials and accessory obstetric and gynecological devices associated with adverse incidents: Secondary | ICD-10-CM | POA: Diagnosis present

## 2019-09-18 DIAGNOSIS — Z8249 Family history of ischemic heart disease and other diseases of the circulatory system: Secondary | ICD-10-CM | POA: Diagnosis not present

## 2019-09-18 DIAGNOSIS — Z803 Family history of malignant neoplasm of breast: Secondary | ICD-10-CM | POA: Diagnosis not present

## 2019-09-18 DIAGNOSIS — Z823 Family history of stroke: Secondary | ICD-10-CM | POA: Diagnosis not present

## 2019-09-18 DIAGNOSIS — Z833 Family history of diabetes mellitus: Secondary | ICD-10-CM | POA: Diagnosis not present

## 2019-09-18 DIAGNOSIS — Z820 Family history of epilepsy and other diseases of the nervous system: Secondary | ICD-10-CM | POA: Diagnosis not present

## 2019-09-18 DIAGNOSIS — L03311 Cellulitis of abdominal wall: Secondary | ICD-10-CM | POA: Diagnosis present

## 2019-09-18 DIAGNOSIS — K219 Gastro-esophageal reflux disease without esophagitis: Secondary | ICD-10-CM | POA: Diagnosis present

## 2019-09-18 DIAGNOSIS — Z91013 Allergy to seafood: Secondary | ICD-10-CM | POA: Diagnosis not present

## 2019-09-18 DIAGNOSIS — Z9889 Other specified postprocedural states: Secondary | ICD-10-CM | POA: Diagnosis not present

## 2019-09-18 DIAGNOSIS — T8332XA Displacement of intrauterine contraceptive device, initial encounter: Secondary | ICD-10-CM | POA: Diagnosis present

## 2019-09-18 DIAGNOSIS — Z79899 Other long term (current) drug therapy: Secondary | ICD-10-CM | POA: Diagnosis not present

## 2019-09-18 DIAGNOSIS — D573 Sickle-cell trait: Secondary | ICD-10-CM | POA: Diagnosis present

## 2019-09-18 DIAGNOSIS — Z79891 Long term (current) use of opiate analgesic: Secondary | ICD-10-CM | POA: Diagnosis not present

## 2019-09-18 DIAGNOSIS — Z20828 Contact with and (suspected) exposure to other viral communicable diseases: Secondary | ICD-10-CM | POA: Diagnosis present

## 2019-09-18 LAB — BASIC METABOLIC PANEL
Anion gap: 9 (ref 5–15)
BUN: 5 mg/dL — ABNORMAL LOW (ref 6–20)
CO2: 21 mmol/L — ABNORMAL LOW (ref 22–32)
Calcium: 8.2 mg/dL — ABNORMAL LOW (ref 8.9–10.3)
Chloride: 109 mmol/L (ref 98–111)
Creatinine, Ser: 0.88 mg/dL (ref 0.44–1.00)
GFR calc Af Amer: >60 mL/min (ref >60)
GFR calc non Af Amer: >60 mL/min (ref >60)
Glucose, Bld: 115 mg/dL — ABNORMAL HIGH (ref 70–99)
Potassium: 3.3 mmol/L — ABNORMAL LOW (ref 3.5–5.1)
Sodium: 139 mmol/L (ref 135–145)

## 2019-09-18 LAB — CBC
HCT: 33.4 % — ABNORMAL LOW (ref 36.0–46.0)
Hemoglobin: 11.4 g/dL — ABNORMAL LOW (ref 12.0–15.0)
MCH: 29.1 pg (ref 26.0–34.0)
MCHC: 34.1 g/dL (ref 30.0–36.0)
MCV: 85.2 fL (ref 80.0–100.0)
Platelets: 289 10*3/uL (ref 150–400)
RBC: 3.92 MIL/uL (ref 3.87–5.11)
RDW: 12.8 % (ref 11.5–15.5)
WBC: 5.1 10*3/uL (ref 4.0–10.5)
nRBC: 0 % (ref 0.0–0.2)

## 2019-09-18 LAB — HIV ANTIBODY (ROUTINE TESTING W REFLEX): HIV Screen 4th Generation wRfx: NONREACTIVE

## 2019-09-18 MED ORDER — VANCOMYCIN HCL IN DEXTROSE 750-5 MG/150ML-% IV SOLN
750.0000 mg | Freq: Two times a day (BID) | INTRAVENOUS | Status: DC
  Administered 2019-09-18 – 2019-09-19 (×3): 750 mg via INTRAVENOUS
  Filled 2019-09-18 (×3): qty 150

## 2019-09-18 MED ORDER — ACETAMINOPHEN 500 MG PO TABS
1000.0000 mg | ORAL_TABLET | Freq: Four times a day (QID) | ORAL | Status: DC
  Administered 2019-09-18 – 2019-09-19 (×3): 1000 mg via ORAL
  Filled 2019-09-18 (×3): qty 2

## 2019-09-18 MED ORDER — SILVER SULFADIAZINE 1 % EX CREA
TOPICAL_CREAM | Freq: Two times a day (BID) | CUTANEOUS | Status: DC
  Filled 2019-09-18: qty 85

## 2019-09-18 MED ORDER — SILVER SULFADIAZINE 1 % EX CREA: TOPICAL_CREAM | Freq: Two times a day (BID) | CUTANEOUS | Status: DC

## 2019-09-18 NOTE — Progress Notes (Signed)
I had been asked to speak with her over the phone at her request - came by to see as well  Dr. Rosendo Gros had consulted with Dr. Marla Roe (our on call plastic surgeon) about her case this morning and for consultation. I called and spoke with her late this afternoon and discussed recent temp of 38.6. She has reviewed her case and recommended we continue IV abx and add topical coverage - Silvadene to cover eschars for local therapy. Currently it is to be determined whether she will require debridement/skin graft coverage of these. She is planning on coming by to further discuss with patient as well  Tm 38.6, VSS WBC 5.1 from 11 NAD, comfortable, sitting up in bed RRR NWOB Examined wounds with her tech from the floor as chaperone - eschars are unchanged in appearance from photos in media from 24 hours ago; small amount of erythema that was noted in photos has began to regress. No palpable crepitus Abd is soft, nontender, nondistended  Patient stated multiple providers had told her a gynecology consult would be placed but wasn't sure if this was done - I will go ahead and follow-up on this with our gynecology team   -Will plan to cont V/Z/Clinda -Topical silvadene -Does not appear to have any necrotizing soft tissue infection at this time. Does have eschars. She has multiple questions for the plastic surgery service including whether this will slough vs need debridement, final contours of her abdominal wall, appearance, and with best cosmetic result possible. Plastic surgery following for possible debridement of eschars; certainly with consideration of depth of excision, perfusion of abdominal wall in setting of abdominoplasty, timing of coverage if indicated, among other factors, their expertise is much appreciated  Nadeen Landau, M.D. [facility]

## 2019-09-18 NOTE — ED Notes (Signed)
Attempted to call report x 1  

## 2019-09-18 NOTE — Progress Notes (Signed)
Dressing was changed.  Small amount of dark brown drainage noted.  Darkened area noted at the upper abd and to the right lower abd.  Incision appears to be healing.

## 2019-09-18 NOTE — ED Notes (Signed)
ED TO INPATIENT HANDOFF REPORT  ED Nurse Name and Phone #: Lovell Sheehan [phone]   S Name/Age/Gender Lonni Fix 38 y.o. female Room/Bed: 034C/034C  Code Status   Code Status: Full Code  Home/SNF/Other Home Patient oriented to: self, place, time and situation Is this baseline? Yes   Triage Complete: Triage complete  Chief Complaint Abdominal wall cellulitis [D40.814]  Triage Note Pt reports she had a liposuction and resection of the abdomen on 12/10, states she spiked a fever on 12/12 that went away and then had a fever again yesterday that she could not get to go away. States she is still on antibiotics from her surgery. Reports continued abdominal pain at surgical site. Denies cough or sob. No recent sick contacts.     Allergies Allergies  Allergen Reactions  . Crab [Shellfish Allergy] Anaphylaxis and Other (See Comments)    Tongue swelling    Level of Care/Admitting Diagnosis ED Disposition    ED Disposition Condition Comment   Admit  Hospital Area: [facility] [[id number]]  Level of Care: Med-Surg [16]  Covid Evaluation: Asymptomatic Screening Protocol (No Symptoms)  Diagnosis: Abdominal wall cellulitis [481856]  Admitting Physician: CCS, [place]  Attending Physician: CCS, MD [[id number]]       B Medical/Surgery History Past Medical History:  Diagnosis Date  . AMA (advanced maternal age) multigravida 85+   . GERD (gastroesophageal reflux disease)   . Gestational edema affecting puerperium 06/08/2016  . History of gestational hypertension   . Postpartum care following cesarean delivery (9/12) 06/07/2016  . Postpartum care following cesarean delivery (9/12) 06/06/2016  . Postpartum care following cesarean delivery (9/13) 06/07/2016  . Sickle-cell trait [facility])    Past Surgical History:  Procedure Laterality Date  . CESAREAN SECTION N/A 06/06/2016   Procedure: CESAREAN SECTION;  Surgeon: Aloha Gell, MD;  Location: [place];  Service:  Obstetrics;  Laterality: N/A;  . CESAREAN SECTION N/A 08/02/2017   Procedure: Repeat CESAREAN SECTION;  Surgeon: Aloha Gell, MD;  Location: [place];  Service: Obstetrics;  Laterality: N/A;  EDD: 08/09/17 Allergy: Shellfish Jill Side, RNFA     A IV Location/Drains/Wounds Patient Lines/Drains/Airways Status   Active Line/Drains/Airways    Name:   Placement date:   Placement time:   Site:   Days:   Peripheral IV 09/17/19 Right Antecubital   09/17/19    1630    Antecubital   1   Incision (Closed) 06/07/16 Abdomen Other (Comment)   06/07/16    0130     1198   Incision (Closed) 06/07/16 Perineum Other (Comment)   06/07/16    0130     1198   Incision (Closed) 08/02/17 Abdomen   08/02/17    0843     777   Incision (Closed) 08/02/17 Perineum   08/02/17    0846     777          Intake/Output Last 24 hours  Intake/Output Summary (Last 24 hours) at 09/18/2019 0025 Last data filed at 09/17/2019 2328 Gross per 24 hour  Intake 1300 ml  Output --  Net 1300 ml    Labs/Imaging Results for orders placed or performed during the hospital encounter of 09/17/19 (from the past 48 hour(s))  Lactic acid, plasma     Status: None   Collection Time: 09/17/19  1:02 PM  Result Value Ref Range   Lactic Acid, Venous 0.8 0.5 - 1.9 mmol/L    Comment: Performed at [facility], [place]  [address], [place], [place] [address]  Comprehensive metabolic panel     Status: Abnormal   Collection Time: 09/17/19  1:02 PM  Result Value Ref Range   Sodium 140 135 - 145 mmol/L   Potassium 3.9 3.5 - 5.1 mmol/L   Chloride 109 98 - 111 mmol/L   CO2 23 22 - 32 mmol/L   Glucose, Bld 94 70 - 99 mg/dL   BUN 5 (L) 6 - 20 mg/dL   Creatinine, Ser 0.74 0.44 - 1.00 mg/dL   Calcium 8.4 (L) 8.9 - 10.3 mg/dL   Total Protein 5.8 (L) 6.5 - 8.1 g/dL   Albumin 3.0 (L) 3.5 - 5.0 g/dL   AST 20 15 - 41 U/L   ALT 22 0 - 44 U/L   Alkaline Phosphatase 37 (L) 38 - 126 U/L   Total Bilirubin 0.6 0.3 - 1.2 mg/dL   GFR  calc non Af Amer >60 >60 mL/min   GFR calc Af Amer >60 >60 mL/min   Anion gap 8 5 - 15    Comment: Performed at [facility] [address] [place], [place] [address]  CBC with Differential     Status: Abnormal   Collection Time: 09/17/19  1:02 PM  Result Value Ref Range   WBC 5.8 4.0 - 10.5 K/uL   RBC 4.09 3.87 - 5.11 MIL/uL   Hemoglobin 11.8 (L) 12.0 - 15.0 g/dL   HCT 35.0 (L) 36.0 - 46.0 %   MCV 85.6 80.0 - 100.0 fL   MCH 28.9 26.0 - 34.0 pg   MCHC 33.7 30.0 - 36.0 g/dL   RDW 12.7 11.5 - 15.5 %   Platelets 338 150 - 400 K/uL   nRBC 0.0 0.0 - 0.2 %   Neutrophils Relative % 51 %   Neutro Abs 3.0 1.7 - 7.7 K/uL   Lymphocytes Relative 32 %   Lymphs Abs 1.9 0.7 - 4.0 K/uL   Monocytes Relative 11 %   Monocytes Absolute 0.6 0.1 - 1.0 K/uL   Eosinophils Relative 4 %   Eosinophils Absolute 0.2 0.0 - 0.5 K/uL   Basophils Relative 2 %   Basophils Absolute 0.1 0.0 - 0.1 K/uL   nRBC 0 0 /100 WBC   Abs Immature Granulocytes 0.00 0.00 - 0.07 K/uL    Comment: Performed at [facility], [address] [place], [place] [address]  I-Stat beta hCG blood, ED     Status: None   Collection Time: 09/17/19  1:18 PM  Result Value Ref Range   I-stat hCG, quantitative <5.0 <5 mIU/mL   Comment 3            Comment:   GEST. AGE      CONC.  (mIU/mL)   <=1 WEEK        5 - 50     2 WEEKS       50 - 500     3 WEEKS       100 - 10,000     4 WEEKS     1,000 - 30,000        FEMALE AND NON-PREGNANT FEMALE:     LESS THAN 5 mIU/mL   POC SARS Coronavirus 2 Ag-ED - Nasal Swab (BD Veritor Kit)     Status: None   Collection Time: 09/17/19  7:01 PM  Result Value Ref Range   SARS Coronavirus 2 Ag NEGATIVE NEGATIVE    Comment: (NOTE) SARS-CoV-2 antigen NOT DETECTED.  Negative results are presumptive.  Negative results do not preclude SARS-CoV-2 infection and should not be used as the sole basis for treatment or other patient management decisions, including infection  control decisions,  particularly in the presence of clinical signs and  symptoms consistent with COVID-19, or in those who have been in contact with the virus.  Negative results must be combined with clinical observations, patient history, and epidemiological information. The expected result is Negative. Fact Sheet for Patients: PodPark.tn Fact Sheet for Healthcare Providers: GiftContent.is This test is not yet approved or cleared by the [place] FDA and  has been authorized for detection and/or diagnosis of SARS-CoV-2 by FDA under an Emergency Use Authorization (EUA).  This EUA will remain in effect (meaning this test can be used) for the duration of  the COVID-19 de claration under Section 564(b)(1) of the Act, 21 U.S.C. section 360bbb-3(b)(1), unless the authorization is terminated or revoked sooner.   Respiratory Panel by RT PCR (Flu A&B, Covid) - Urine, Clean Catch     Status: None   Collection Time: 09/17/19 10:07 PM   Specimen: Urine, Clean Catch  Result Value Ref Range   SARS Coronavirus 2 by RT PCR NEGATIVE NEGATIVE    Comment: (NOTE) SARS-CoV-2 target nucleic acids are NOT DETECTED. The SARS-CoV-2 RNA is generally detectable in upper respiratoy specimens during the acute phase of infection. The lowest concentration of SARS-CoV-2 viral copies this assay can detect is 131 copies/mL. A negative result does not preclude SARS-Cov-2 infection and should not be used as the sole basis for treatment or other patient management decisions. A negative result may occur with  improper specimen collection/handling, submission of specimen other than nasopharyngeal swab, presence of viral mutation(s) within the areas targeted by this assay, and inadequate number of viral copies (<131 copies/mL). A negative result must be combined with clinical observations, patient history, and epidemiological information. The expected result is Negative. Fact  Sheet for Patients:  PinkCheek.be Fact Sheet for Healthcare Providers:  GravelBags.it This test is not yet ap proved or cleared by the [place] FDA and  has been authorized for detection and/or diagnosis of SARS-CoV-2 by FDA under an Emergency Use Authorization (EUA). This EUA will remain  in effect (meaning this test can be used) for the duration of the COVID-19 declaration under Section 564(b)(1) of the Act, 21 U.S.C. section 360bbb-3(b)(1), unless the authorization is terminated or revoked sooner.    Influenza A by PCR NEGATIVE NEGATIVE   Influenza B by PCR NEGATIVE NEGATIVE    Comment: (NOTE) The Xpert Xpress SARS-CoV-2/FLU/RSV assay is intended as an aid in  the diagnosis of influenza from Nasopharyngeal swab specimens and  should not be used as a sole basis for treatment. Nasal washings and  aspirates are unacceptable for Xpert Xpress SARS-CoV-2/FLU/RSV  testing. Fact Sheet for Patients: PinkCheek.be Fact Sheet for Healthcare Providers: GravelBags.it This test is not yet approved or cleared by the [place] FDA and  has been authorized for detection and/or diagnosis of SARS-CoV-2 by  FDA under an Emergency Use Authorization (EUA). This EUA will remain  in effect (meaning this test can be used) for the duration of the  Covid-19 declaration under Section 564(b)(1) of the Act, 21  U.S.C. section 360bbb-3(b)(1), unless the authorization is  terminated or revoked. Performed at [facility], [place] [address] [place], [place] [address]   Urinalysis, Routine w reflex microscopic     Status: Abnormal   Collection Time: 09/17/19 10:29 PM  Result Value Ref Range  Color, Urine YELLOW YELLOW   APPearance CLEAR CLEAR   Specific Gravity, Urine >1.046 (H) 1.005 - 1.030   pH 6.0 5.0 - 8.0   Glucose, UA NEGATIVE NEGATIVE mg/dL   Hgb urine dipstick NEGATIVE  NEGATIVE   Bilirubin Urine NEGATIVE NEGATIVE   Ketones, ur 5 (A) NEGATIVE mg/dL   Protein, ur NEGATIVE NEGATIVE mg/dL   Nitrite NEGATIVE NEGATIVE   Leukocytes,Ua MODERATE (A) NEGATIVE   RBC / HPF 0-5 0 - 5 RBC/hpf   WBC, UA 21-50 0 - 5 WBC/hpf   Bacteria, UA RARE (A) NONE SEEN   Squamous Epithelial / LPF 6-10 0 - 5    Comment: Performed at [facility], [place] [address] [place], [place] [address]   CT Abdomen Pelvis W Contrast  Result Date: 09/17/2019 CLINICAL DATA:  Liposuction performed 09/04/2019, nonhealing wounds and cellulitis EXAM: CT ABDOMEN AND PELVIS WITH CONTRAST TECHNIQUE: Multidetector CT imaging of the abdomen and pelvis was performed using the standard protocol following bolus administration of intravenous contrast. CONTRAST:  180m OMNIPAQUE IOHEXOL 300 MG/ML  SOLN COMPARISON:  None. FINDINGS: Lower chest: Lung bases are clear. Normal heart size. No pericardial effusion. Hepatobiliary: No focal liver abnormality is seen. No gallstones, gallbladder wall thickening, or biliary dilatation. Pancreas: Unremarkable. No pancreatic ductal dilatation or surrounding inflammatory changes. Spleen: Normal in size without focal abnormality. Adrenals/Urinary Tract: Adrenal glands are unremarkable. Left extrarenal pelvis. Kidneys are otherwise normal, without renal calculi, focal lesion, or hydronephrosis. Bladder is unremarkable. Stomach/Bowel: Distal esophagus, stomach and duodenal sweep are unremarkable. No small bowel wall thickening or dilatation. No evidence of obstruction. A normal appendix is visualized. No colonic dilatation or wall thickening. Vascular/Lymphatic: The aorta is normal caliber. No suspicious or enlarged lymph nodes in the included lymphatic chains. Reproductive: Anteverted uterus. Tiny fundal fibroid. Radiopaque IUD is malpositioned with the right arm extending proud of the serosal surface and into the peritoneum. Other: There is extensive overlying skin thickening and  scattered sites of ulceration along the anterior abdomen as well as the lateral and posterior flanks with subjacent extensive fast stranding and loculated fluid collections some of which demonstrate some thin rim enhancement. A punctate focus of gas is seen within a collection along the right abdominal wall (2/44). Swelling and edema extends to the fascial planes of the rectus sheath. Musculoskeletal: Uniform enhancement of the abdominal musculature at this time. Inflammation in fluid seen tracking along the superficial fascial planes of the abdominal wall. No acute osseous abnormality or suspicious osseous lesion. IMPRESSION: 1. Extensive overlying skin thickening and scattered sites of ulceration along the anterior abdomen as well as the lateral and posterior flanks with subjacent extensive fast stranding and loculated fluid collections some of which demonstrate some thin rim enhancement. A punctate focus of gas is seen within a collection along the right abdominal wall (2/44). Findings are itis compatible with cellulitis and the presence of gas within the soft tissues given time since operation (13 days) is highly worrisome for underlying infection including potential necrotizing soft tissue infection. Urgent surgical consultation is warranted. 2. Swelling and edema extends to the fascial planes of the rectus sheath at this time. No intraperitoneal inflammation is seen. 3. Radiopaque IUD is malpositioned with the right arm extending proud of the serosal surface of the uterine fundus and extending into the peritoneum. Critical Value/emergent results were called by telephone at the time of interpretation on 09/17/2019 at 8:36 pm [facility] verbally [facility] these results. Electronically Signed   By:  Lovena Le M.D.   On: 09/17/2019 20:36    Pending Labs Unresulted Labs (From admission, onward)    Start     Ordered   09/18/19 6440  Basic metabolic panel  Tomorrow morning,   R      09/17/19 2317   09/18/19 0500  CBC  Tomorrow morning,   R     09/17/19 2317   09/17/19 2318  HIV Antibody (routine testing w rflx)  (HIV Antibody (Routine testing w reflex) panel)  Once,   STAT     09/17/19 2317   09/17/19 1614  Wound or Superficial Culture  ONCE - STAT,   STAT     09/17/19 1613   09/17/19 1613  Culture, blood (routine x 2)  BLOOD CULTURE X 2,   STAT     09/17/19 1613          Vitals/Pain Today's Vitals   09/17/19 1255 09/17/19 1536 09/17/19 1800 09/17/19 2204  BP:  123/83  121/79  Pulse:  96  80  Resp:  16  18  Temp:    98.8 F (37.1 C)  TempSrc:    Oral  SpO2:  97%  99%  Weight:   86.6 kg   Height:   5' 4"  (1.626 m)   PainSc: 4    4     Isolation Precautions No active isolations  Medications Medications  vancomycin (VANCOCIN) IVPB 1000 mg/200 mL premix (0 mg Intravenous Stopped 09/17/19 2149)  piperacillin-tazobactam (ZOSYN) IVPB 3.375 g (0 g Intravenous Stopped 09/17/19 2328)    Followed by  piperacillin-tazobactam (ZOSYN) IVPB 3.375 g (has no administration in time range)  senna-docusate (Senokot-S) tablet 2 tablet (2 tablets Oral Given 09/18/19 0011)  enoxaparin (LOVENOX) injection 40 mg (has no administration in time range)  0.9 % NaCl with KCl 20 mEq/ L  infusion (has no administration in time range)  acetaminophen (TYLENOL) tablet 650 mg (has no administration in time range)    Or  acetaminophen (TYLENOL) suppository 650 mg (has no administration in time range)  oxyCODONE (Oxy IR/ROXICODONE) immediate release tablet 5-10 mg (has no administration in time range)  morphine 2 MG/ML injection 2-4 mg (has no administration in time range)  diphenhydrAMINE (BENADRYL) capsule 25 mg (has no administration in time range)    Or  diphenhydrAMINE (BENADRYL) injection 25 mg (has no administration in time range)  ondansetron (ZOFRAN-ODT) disintegrating tablet 4 mg (has no administration in time range)    Or  ondansetron (ZOFRAN) injection 4 mg (has no  administration in time range)  simethicone (MYLICON) chewable tablet 40 mg (has no administration in time range)  acetaminophen (TYLENOL) tablet 1,000 mg (1,000 mg Oral Given 09/17/19 1642)  sodium chloride 0.9 % bolus 1,000 mL (0 mLs Intravenous Stopped 09/17/19 2149)  iohexol (OMNIPAQUE) 300 MG/ML solution 100 mL (100 mLs Intravenous Contrast Given 09/17/19 2000)  clindamycin (CLEOCIN) IVPB 900 mg (0 mg Intravenous Stopped 09/17/19 2228)    Mobility walks Low fall risk   Focused Assessments   R Recommendations: See Admitting Provider Note  Report given to:   Additional Notes:

## 2019-09-18 NOTE — Progress Notes (Signed)
The patient co having a headache all day but has refused to take any pain medication or the tylenol when offered. The patient called the nurse into the room and was asking to talk to the Doctor on call.  Dr Dema Severin was called and he requested that the patient talk with Phoebe Sharps PA The patient is also questioning why anyone has not ordered any type of topical medication to go over abd wounds and the amount of tylenol ordered.  Dr Dema Severin has returned the call and is presently speaking to the patient about her concerns.  Dr Dema Severin is aware of the fever of 101.4 and that the patient has been medicated as ordered.

## 2019-09-18 NOTE — Progress Notes (Signed)
Subjective/Chief Complaint: Pt with no acute changes overnight   Objective: Vital signs in last 24 hours: Temp:  [98.8 F (37.1 C)-99.8 F (37.7 C)] 99.8 F (37.7 C) (12/24 0822) Pulse Rate:  [80-101] 90 (12/24 0822) Resp:  [16-18] 18 (12/24 0822) BP: (99-127)/(66-83) 99/69 (12/24 0822) SpO2:  [97 %-100 %] 98 % (12/24 0822) Weight:  [86.6 kg] 86.6 kg (12/23 1800) Last BM Date: 09/14/19  Intake/Output from previous day: 12/23 0701 - 12/24 0700 In: 1300 [IV Piggyback:1300] Out: -  Intake/Output this shift: No intake/output data recorded.  Constitutional: No acute distress, conversant, appears states age. Eyes: Anicteric sclerae, moist conjunctiva, no lid lag Lungs: Clear to auscultation bilaterally, normal respiratory effort CV: regular rate and rhythm, no murmurs, no peripheral edema, pedal pulses 2+ GI: Soft, no masses or hepatosplenomegaly, non-tender to palpation, abdominal superficial skin appears necrotic and some sloughing, no drainage Skin: No rashes, palpation reveals normal turgor Psychiatric: appropriate judgment and insight, oriented to person, place, and time   Lab Results:  Recent Labs    09/17/19 1302 09/18/19 0225  WBC 5.8 5.1  HGB 11.8* 11.4*  HCT 35.0* 33.4*  PLT 338 289   BMET Recent Labs    09/17/19 1302 09/18/19 0225  NA 140 139  K 3.9 3.3*  CL 109 109  CO2 23 21*  GLUCOSE 94 115*  BUN 5* 5*  CREATININE 0.74 0.88  CALCIUM 8.4* 8.2*   PT/INR No results for input(s): LABPROT, INR in the last 72 hours. ABG No results for input(s): PHART, HCO3 in the last 72 hours.  Invalid input(s): PCO2, PO2  Studies/Results: CT Abdomen Pelvis W Contrast  Result Date: 09/17/2019 CLINICAL DATA:  Liposuction performed 09/04/2019, nonhealing wounds and cellulitis EXAM: CT ABDOMEN AND PELVIS WITH CONTRAST TECHNIQUE: Multidetector CT imaging of the abdomen and pelvis was performed using the standard protocol following bolus administration of  intravenous contrast. CONTRAST:  136mL OMNIPAQUE IOHEXOL 300 MG/ML  SOLN COMPARISON:  None. FINDINGS: Lower chest: Lung bases are clear. Normal heart size. No pericardial effusion. Hepatobiliary: No focal liver abnormality is seen. No gallstones, gallbladder wall thickening, or biliary dilatation. Pancreas: Unremarkable. No pancreatic ductal dilatation or surrounding inflammatory changes. Spleen: Normal in size without focal abnormality. Adrenals/Urinary Tract: Adrenal glands are unremarkable. Left extrarenal pelvis. Kidneys are otherwise normal, without renal calculi, focal lesion, or hydronephrosis. Bladder is unremarkable. Stomach/Bowel: Distal esophagus, stomach and duodenal sweep are unremarkable. No small bowel wall thickening or dilatation. No evidence of obstruction. A normal appendix is visualized. No colonic dilatation or wall thickening. Vascular/Lymphatic: The aorta is normal caliber. No suspicious or enlarged lymph nodes in the included lymphatic chains. Reproductive: Anteverted uterus. Tiny fundal fibroid. Radiopaque IUD is malpositioned with the right arm extending proud of the serosal surface and into the peritoneum. Other: There is extensive overlying skin thickening and scattered sites of ulceration along the anterior abdomen as well as the lateral and posterior flanks with subjacent extensive fast stranding and loculated fluid collections some of which demonstrate some thin rim enhancement. A punctate focus of gas is seen within a collection along the right abdominal wall (2/44). Swelling and edema extends to the fascial planes of the rectus sheath. Musculoskeletal: Uniform enhancement of the abdominal musculature at this time. Inflammation in fluid seen tracking along the superficial fascial planes of the abdominal wall. No acute osseous abnormality or suspicious osseous lesion. IMPRESSION: 1. Extensive overlying skin thickening and scattered sites of ulceration along the anterior abdomen as well  as the lateral  and posterior flanks with subjacent extensive fast stranding and loculated fluid collections some of which demonstrate some thin rim enhancement. A punctate focus of gas is seen within a collection along the right abdominal wall (2/44). Findings are itis compatible with cellulitis and the presence of gas within the soft tissues given time since operation (13 days) is highly worrisome for underlying infection including potential necrotizing soft tissue infection. Urgent surgical consultation is warranted. 2. Swelling and edema extends to the fascial planes of the rectus sheath at this time. No intraperitoneal inflammation is seen. 3. Radiopaque IUD is malpositioned with the right arm extending proud of the serosal surface of the uterine fundus and extending into the peritoneum. Critical Value/emergent results were called by telephone at the time of interpretation on 09/17/2019 at 8:36 pm to [facility] , who verbally acknowledged these results. Electronically Signed   By: Lovena Le M.D.   On: 09/17/2019 20:36    Anti-infectives: Anti-infectives (From admission, onward)   Start     Dose/Rate Route Frequency Ordered Stop   09/18/19 0330  piperacillin-tazobactam (ZOSYN) IVPB 3.375 g     3.375 g 12.5 mL/hr over 240 Minutes Intravenous Every 8 hours 09/17/19 2103     09/17/19 2115  piperacillin-tazobactam (ZOSYN) IVPB 3.375 g     3.375 g 100 mL/hr over 30 Minutes Intravenous  Once 09/17/19 2103 09/17/19 2328   09/17/19 2100  clindamycin (CLEOCIN) IVPB 900 mg     900 mg 100 mL/hr over 30 Minutes Intravenous  Once 09/17/19 2051 09/17/19 2228   09/17/19 1830  vancomycin (VANCOCIN) IVPB 1000 mg/200 mL premix     1,000 mg 200 mL/hr over 60 Minutes Intravenous Every 12 hours 09/17/19 1819        Assessment/Plan: 1.  Cellulitis/ skin necrosis after recent liposuction and abdominoplasty by Dr. Theda Sers of [place], [place] 2.  No signs of sepsis - currently afebrile, not  tachycardic, normotensive, normal WBC.   Plan: No indications for emergent surgery. IV antibiotics Plastic Surgery evaluation If no improvement, may need debridement of her abdominal wall, but I suspect may be just superficial and can allow to heal.  Pt is curious of the timing.   LOS: 0 days    Ralene Ok 09/18/2019

## 2019-09-18 NOTE — Progress Notes (Signed)
Pharmacy Antibiotic Note  Dorothy Cisneros is a 38 y.o. female admitted on 09/17/2019 with cellulitis. Pharmacy has been consulted for Vancomycin dosing.  So far patient has received vancomycin 1,000mg  x1. The 0800 dose this AM was not given so I am going to decrease the dose and schedule it to start now which actually turns out to work nicely since their Scr bumped a bit.  Assessment: WBC 5.8>5.1, Tmax 99.8, lactate 0.8 Pt was on keflex PTA and not improving  CT showed some gas that appeared to be necrotizing   Plan:  Decrease vancomycin to 750mg  Q12h given increase in Scr (AUC calculated to be 700 if pt remained on 1,000mg  Q12h) Goal AUC 400-550. Expected AUC: 525 SCr used: 0.88 Monitor patient renal function and cultures   Height: 5\' 4"  (162.6 cm) Weight: 190 lb 14.7 oz (86.6 kg) IBW/kg (Calculated) : 54.7  Temp (24hrs), Avg:99.3 F (37.4 C), Min:98.8 F (37.1 C), Max:99.8 F (37.7 C)  Recent Labs  Lab 09/17/19 1302 09/18/19 0225  WBC 5.8 5.1  CREATININE 0.74 0.88  LATICACIDVEN 0.8  --     Estimated Creatinine Clearance: 91.5 mL/min (by C-G formula based on SCr of 0.88 mg/dL).    Allergies  Allergen Reactions  . Crab [Shellfish Allergy] Anaphylaxis and Other (See Comments)    Tongue swelling    Antimicrobials this admission: Clinda x1 12/23 Zosyn 12/23>> Vanc 12/23>>   Dose adjustments this admission: Vanc 1000mg  Q12h> 750mg  Q12h  Microbiology results: 12/23 Bcx x2: ngtd 12/23 wound Cx: few GPC 12/23 resp panel: neg    Thank you for allowing pharmacy to be a part of this patient's care.  Beryle Lathe, PharmD 09/18/2019 10:59 AM

## 2019-09-18 NOTE — Consult Note (Signed)
@LOGO @  Reason for Consult:  Abdominal wounds Referring Physician: Dr. Rosendo Gros, Surgery  Dorothy Cisneros is an 38 y.o. female.  HPI: Dorothy Cisneros underwent TriSculpt (micro-laser liposuction) and abdominoplasty on 09/04/2019 with Dr. Kathaleen Grinder of [facility]. Reports areas on abdomen began to blacken. Blackened areas are numb, areas around this are dulled sensation. Was given silvadene cream (12/17) and nitro-bid (12/19 by Dr. Theda Sers that she has been using on these areas.  Reports also taking keflex 4x per day since the surgery.  Presented to the ER 12/23 after running fevers over 101 since the day before.   CT w/ contrast 12/23:  IMPRESSION: 1. Extensive overlying skin thickening and scattered sites of ulceration along the anterior abdomen as well as the lateral and posterior flanks with subjacent extensive fast stranding and loculated fluid collections some of which demonstrate some thin rim enhancement. A punctate focus of gas is seen within a collection along the right abdominal wall (2/44). Findings are itis compatible with cellulitis and the presence of gas within the soft tissues given time since operation (13 days) is highly worrisome for underlying infection including potential necrotizing soft tissue infection. Urgent surgical consultation is warranted. 2. Swelling and edema extends to the fascial planes of the rectus sheath at this time. No intraperitoneal inflammation is seen.  Currently receiving IV antibiotics to treat cellulitis/ skin necrosis.     Past Medical History:  Diagnosis Date  . AMA (advanced maternal age) multigravida 20+   . GERD (gastroesophageal reflux disease)   . Gestational edema affecting puerperium 06/08/2016  . History of gestational hypertension   . Postpartum care following cesarean delivery (9/12) 06/07/2016  . Postpartum care following cesarean delivery (9/12) 06/06/2016  . Postpartum care following cesarean delivery (9/13) 06/07/2016  . Sickle-cell trait  [facility])     Past Surgical History:  Procedure Laterality Date  . CESAREAN SECTION N/A 06/06/2016   Procedure: CESAREAN SECTION;  Surgeon: Aloha Gell, MD;  Location: [place];  Service: Obstetrics;  Laterality: N/A;  . CESAREAN SECTION N/A 08/02/2017   Procedure: Repeat CESAREAN SECTION;  Surgeon: Aloha Gell, MD;  Location: [place];  Service: Obstetrics;  Laterality: N/A;  EDD: 08/09/17 Allergy: Shellfish Heather K, RNFA    Family History  Problem Relation Age of Onset  . Hypertension Mother   . Stroke Mother   . Breast cancer Mother   . Heart failure Mother   . Diabetes Mother   . Breast cancer Maternal Aunt   . Alzheimer's disease Maternal Aunt   . Heart disease Maternal Grandmother   . Hypertension Maternal Grandmother   . Diabetes Maternal Grandmother     Social History:  reports that she has never smoked. She has never used smokeless tobacco. She reports that she does not drink alcohol or use drugs.  Allergies:  Allergies  Allergen Reactions  . Crab [Shellfish Allergy] Anaphylaxis and Other (See Comments)    Tongue swelling    Medications: I have reviewed the patient's current medications.  Results for orders placed or performed during the hospital encounter of 09/17/19 (from the past 48 hour(s))  Lactic acid, plasma     Status: None   Collection Time: 09/17/19  1:02 PM  Result Value Ref Range   Lactic Acid, Venous 0.8 0.5 - 1.9 mmol/L    Comment: Performed at [facility], [address] [place], [place] [address]  Comprehensive metabolic panel     Status: Abnormal   Collection Time: 09/17/19  1:02 PM  Result Value Ref Range   Sodium 140 135 - 145 mmol/L   Potassium 3.9 3.5 - 5.1 mmol/L   Chloride 109 98 - 111 mmol/L   CO2 23 22 - 32 mmol/L   Glucose, Bld 94 70 - 99 mg/dL   BUN 5 (L) 6 - 20 mg/dL   Creatinine, Ser 0.74 0.44 - 1.00 mg/dL   Calcium 8.4 (L) 8.9 - 10.3 mg/dL   Total Protein 5.8 (L) 6.5 - 8.1 g/dL   Albumin 3.0  (L) 3.5 - 5.0 g/dL   AST 20 15 - 41 U/L   ALT 22 0 - 44 U/L   Alkaline Phosphatase 37 (L) 38 - 126 U/L   Total Bilirubin 0.6 0.3 - 1.2 mg/dL   GFR calc non Af Amer >60 >60 mL/min   GFR calc Af Amer >60 >60 mL/min   Anion gap 8 5 - 15    Comment: Performed at [facility] [address], [place], [place] [address]  CBC with Differential     Status: Abnormal   Collection Time: 09/17/19  1:02 PM  Result Value Ref Range   WBC 5.8 4.0 - 10.5 K/uL   RBC 4.09 3.87 - 5.11 MIL/uL   Hemoglobin 11.8 (L) 12.0 - 15.0 g/dL   HCT 35.0 (L) 36.0 - 46.0 %   MCV 85.6 80.0 - 100.0 fL   MCH 28.9 26.0 - 34.0 pg   MCHC 33.7 30.0 - 36.0 g/dL   RDW 12.7 11.5 - 15.5 %   Platelets 338 150 - 400 K/uL   nRBC 0.0 0.0 - 0.2 %   Neutrophils Relative % 51 %   Neutro Abs 3.0 1.7 - 7.7 K/uL   Lymphocytes Relative 32 %   Lymphs Abs 1.9 0.7 - 4.0 K/uL   Monocytes Relative 11 %   Monocytes Absolute 0.6 0.1 - 1.0 K/uL   Eosinophils Relative 4 %   Eosinophils Absolute 0.2 0.0 - 0.5 K/uL   Basophils Relative 2 %   Basophils Absolute 0.1 0.0 - 0.1 K/uL   nRBC 0 0 /100 WBC   Abs Immature Granulocytes 0.00 0.00 - 0.07 K/uL    Comment: Performed at [facility], [address] [place], [place] [address]  I-Stat beta hCG blood, ED     Status: None   Collection Time: 09/17/19  1:18 PM  Result Value Ref Range   I-stat hCG, quantitative <5.0 <5 mIU/mL   Comment 3            Comment:   GEST. AGE      CONC.  (mIU/mL)   <=1 WEEK        5 - 50     2 WEEKS       50 - 500     3 WEEKS       100 - 10,000     4 WEEKS     1,000 - 30,000        FEMALE AND NON-PREGNANT FEMALE:     LESS THAN 5 mIU/mL   Culture, blood (routine x 2)     Status: None (Preliminary result)   Collection Time: 09/17/19  4:19 PM   Specimen: BLOOD  Result Value Ref Range   Specimen Description BLOOD LEFT ANTECUBITAL    Special Requests      BOTTLES DRAWN AEROBIC AND ANAEROBIC Blood Culture adequate volume   Culture      NO GROWTH < 12  HOURS Performed at [facility], [address] [place], [place] [address]    Report Status PENDING   Culture, blood (routine x 2)     Status: None (Preliminary result)   Collection Time: 09/17/19  4:22 PM   Specimen: BLOOD RIGHT ARM  Result Value Ref Range   Specimen Description BLOOD RIGHT ARM    Special Requests      BOTTLES DRAWN AEROBIC AND ANAEROBIC Blood Culture results may not be optimal due to an inadequate volume of blood received in culture bottles   Culture      NO GROWTH < 12 HOURS Performed at [facility], [place] [address], [place], [place] [address]    Report Status PENDING   Wound or Superficial Culture     Status: None (Preliminary result)   Collection Time: 09/17/19  4:26 PM   Specimen: Wound  Result Value Ref Range   Specimen Description WOUND ABDOMEN    Special Requests NONE    Gram Stain      FEW WBC PRESENT, PREDOMINANTLY PMN FEW GRAM POSITIVE COCCI    Culture      CULTURE REINCUBATED FOR BETTER GROWTH Performed at [facility], [place] [address] [place], [place] [address]    Report Status PENDING   POC SARS Coronavirus 2 Ag-ED - Nasal Swab (BD Veritor Kit)     Status: None   Collection Time: 09/17/19  7:01 PM  Result Value Ref Range   SARS Coronavirus 2 Ag NEGATIVE NEGATIVE    Comment: (NOTE) SARS-CoV-2 antigen NOT DETECTED.  Negative results are presumptive.  Negative results do not preclude SARS-CoV-2 infection and should not be used as the sole basis for treatment or other patient management decisions, including infection  control decisions, particularly in the presence of clinical signs and  symptoms consistent with COVID-19, or in those who have been in contact with the virus.  Negative results must be combined with clinical observations, patient history, and epidemiological information. The expected result is Negative. Fact Sheet for Patients: PodPark.tn Fact Sheet for Healthcare Providers:  GiftContent.is This test is not yet approved or cleared by the [place] FDA and  has been authorized for detection and/or diagnosis of SARS-CoV-2 by FDA under an Emergency Use Authorization (EUA).  This EUA will remain in effect (meaning this test can be used) for the duration of  the COVID-19 de claration under Section 564(b)(1) of the Act, 21 U.S.C. section 360bbb-3(b)(1), unless the authorization is terminated or revoked sooner.   Respiratory Panel by RT PCR (Flu A&B, Covid) - Urine, Clean Catch     Status: None   Collection Time: 09/17/19 10:07 PM   Specimen: Urine, Clean Catch  Result Value Ref Range   SARS Coronavirus 2 by RT PCR NEGATIVE NEGATIVE    Comment: (NOTE) SARS-CoV-2 target nucleic acids are NOT DETECTED. The SARS-CoV-2 RNA is generally detectable in upper respiratoy specimens during the acute phase of infection. The lowest concentration of SARS-CoV-2 viral copies this assay can detect is 131 copies/mL. A negative result does not preclude SARS-Cov-2 infection and should not be used as the sole basis for treatment or other patient management decisions. A negative result may occur with  improper specimen collection/handling, submission of specimen other than nasopharyngeal swab, presence of viral mutation(s) within the areas targeted by this assay, and inadequate number of viral copies (<131 copies/mL). A negative result must be combined with clinical observations, patient history, and epidemiological information. The expected result is Negative. Fact Sheet for Patients:  PinkCheek.be  Fact Sheet for Healthcare Providers:  GravelBags.it This test is not yet ap proved or cleared by the [place] FDA and  has been authorized for detection and/or diagnosis of SARS-CoV-2 by FDA under an Emergency Use Authorization (EUA). This EUA will remain  in effect (meaning this test can be  used) for the duration of the COVID-19 declaration under Section 564(b)(1) of the Act, 21 U.S.C. section 360bbb-3(b)(1), unless the authorization is terminated or revoked sooner.    Influenza A by PCR NEGATIVE NEGATIVE   Influenza B by PCR NEGATIVE NEGATIVE    Comment: (NOTE) The Xpert Xpress SARS-CoV-2/FLU/RSV assay is intended as an aid in  the diagnosis of influenza from Nasopharyngeal swab specimens and  should not be used as a sole basis for treatment. Nasal washings and  aspirates are unacceptable for Xpert Xpress SARS-CoV-2/FLU/RSV  testing. Fact Sheet for Patients: PinkCheek.be Fact Sheet for Healthcare Providers: GravelBags.it This test is not yet approved or cleared by the [place] FDA and  has been authorized for detection and/or diagnosis of SARS-CoV-2 by  FDA under an Emergency Use Authorization (EUA). This EUA will remain  in effect (meaning this test can be used) for the duration of the  Covid-19 declaration under Section 564(b)(1) of the Act, 21  U.S.C. section 360bbb-3(b)(1), unless the authorization is  terminated or revoked. Performed at [facility], [place] [address] [place], [place] [address]   Urinalysis, Routine w reflex microscopic     Status: Abnormal   Collection Time: 09/17/19 10:29 PM  Result Value Ref Range   Color, Urine YELLOW YELLOW   APPearance CLEAR CLEAR   Specific Gravity, Urine >1.046 (H) 1.005 - 1.030   pH 6.0 5.0 - 8.0   Glucose, UA NEGATIVE NEGATIVE mg/dL   Hgb urine dipstick NEGATIVE NEGATIVE   Bilirubin Urine NEGATIVE NEGATIVE   Ketones, ur 5 (A) NEGATIVE mg/dL   Protein, ur NEGATIVE NEGATIVE mg/dL   Nitrite NEGATIVE NEGATIVE   Leukocytes,Ua MODERATE (A) NEGATIVE   RBC / HPF 0-5 0 - 5 RBC/hpf   WBC, UA 21-50 0 - 5 WBC/hpf   Bacteria, UA RARE (A) NONE SEEN   Squamous Epithelial / LPF 6-10 0 - 5    Comment: Performed at [facility], [address],  [place], [place] [address]  HIV Antibody (routine testing w rflx)     Status: None   Collection Time: 09/18/19  2:25 AM  Result Value Ref Range   HIV Screen 4th Generation wRfx NON REACTIVE NON REACTIVE    Comment: Performed at [facility] [address], [place], [place] [address]  Basic metabolic panel     Status: Abnormal   Collection Time: 09/18/19  2:25 AM  Result Value Ref Range   Sodium 139 135 - 145 mmol/L   Potassium 3.3 (L) 3.5 - 5.1 mmol/L   Chloride 109 98 - 111 mmol/L   CO2 21 (L) 22 - 32 mmol/L   Glucose, Bld 115 (H) 70 - 99 mg/dL   BUN 5 (L) 6 - 20 mg/dL   Creatinine, Ser 0.88 0.44 - 1.00 mg/dL   Calcium 8.2 (L) 8.9 - 10.3 mg/dL   GFR calc non Af Amer >60 >60 mL/min   GFR calc Af Amer >60 >60 mL/min   Anion gap 9 5 - 15    Comment: Performed at [facility], [place] [address], [place], [place] [address]  CBC     Status: Abnormal   Collection  Time: 09/18/19  2:25 AM  Result Value Ref Range   WBC 5.1 4.0 - 10.5 K/uL   RBC 3.92 3.87 - 5.11 MIL/uL   Hemoglobin 11.4 (L) 12.0 - 15.0 g/dL   HCT 33.4 (L) 36.0 - 46.0 %   MCV 85.2 80.0 - 100.0 fL   MCH 29.1 26.0 - 34.0 pg   MCHC 34.1 30.0 - 36.0 g/dL   RDW 12.8 11.5 - 15.5 %   Platelets 289 150 - 400 K/uL   nRBC 0.0 0.0 - 0.2 %    Comment: Performed at [facility], [place] [address], [place], [place] [address]    CT Abdomen Pelvis W Contrast  Result Date: 09/17/2019 CLINICAL DATA:  Liposuction performed 09/04/2019, nonhealing wounds and cellulitis EXAM: CT ABDOMEN AND PELVIS WITH CONTRAST TECHNIQUE: Multidetector CT imaging of the abdomen and pelvis was performed using the standard protocol following bolus administration of intravenous contrast. CONTRAST:  134m OMNIPAQUE IOHEXOL 300 MG/ML  SOLN COMPARISON:  None. FINDINGS: Lower chest: Lung bases are clear. Normal heart size. No pericardial effusion. Hepatobiliary: No focal liver abnormality is seen. No gallstones, gallbladder wall thickening, or biliary  dilatation. Pancreas: Unremarkable. No pancreatic ductal dilatation or surrounding inflammatory changes. Spleen: Normal in size without focal abnormality. Adrenals/Urinary Tract: Adrenal glands are unremarkable. Left extrarenal pelvis. Kidneys are otherwise normal, without renal calculi, focal lesion, or hydronephrosis. Bladder is unremarkable. Stomach/Bowel: Distal esophagus, stomach and duodenal sweep are unremarkable. No small bowel wall thickening or dilatation. No evidence of obstruction. A normal appendix is visualized. No colonic dilatation or wall thickening. Vascular/Lymphatic: The aorta is normal caliber. No suspicious or enlarged lymph nodes in the included lymphatic chains. Reproductive: Anteverted uterus. Tiny fundal fibroid. Radiopaque IUD is malpositioned with the right arm extending proud of the serosal surface and into the peritoneum. Other: There is extensive overlying skin thickening and scattered sites of ulceration along the anterior abdomen as well as the lateral and posterior flanks with subjacent extensive fast stranding and loculated fluid collections some of which demonstrate some thin rim enhancement. A punctate focus of gas is seen within a collection along the right abdominal wall (2/44). Swelling and edema extends to the fascial planes of the rectus sheath. Musculoskeletal: Uniform enhancement of the abdominal musculature at this time. Inflammation in fluid seen tracking along the superficial fascial planes of the abdominal wall. No acute osseous abnormality or suspicious osseous lesion. IMPRESSION: 1. Extensive overlying skin thickening and scattered sites of ulceration along the anterior abdomen as well as the lateral and posterior flanks with subjacent extensive fast stranding and loculated fluid collections some of which demonstrate some thin rim enhancement. A punctate focus of gas is seen within a collection along the right abdominal wall (2/44). Findings are itis compatible with  cellulitis and the presence of gas within the soft tissues given time since operation (13 days) is highly worrisome for underlying infection including potential necrotizing soft tissue infection. Urgent surgical consultation is warranted. 2. Swelling and edema extends to the fascial planes of the rectus sheath at this time. No intraperitoneal inflammation is seen. 3. Radiopaque IUD is malpositioned with the right arm extending proud of the serosal surface of the uterine fundus and extending into the peritoneum. Critical Value/emergent results were called by telephone at the time of interpretation on 09/17/2019 at 8:36 pm [facility] who verbally acknowledged these results. Electronically Signed   By: PLovena LeM.D.   On: 09/17/2019 20:36    Review of Systems  Constitutional: Positive for chills and fever.  Respiratory: Negative for shortness of breath.   Cardiovascular: Negative for chest pain.  Gastrointestinal: Negative for nausea.  Skin:       Black areas without sensation, area around wounds have dull sensation  Neurological: Positive for headaches (does not currently have headache, but has had them ocassionally since the surgery).   Blood pressure 99/69, pulse 90, temperature 99.8 F (37.7 C), temperature source Oral, resp. rate 18, height 5' 4"  (1.626 m), weight 86.6 kg, SpO2 98 %, currently breastfeeding. Physical Exam  Constitutional: She is oriented to person, place, and time. She appears well-developed and well-nourished.  HENT:  Head: Normocephalic and atraumatic.  Eyes: EOM are normal.  Respiratory: Effort normal.  GI:    Incision hip to hip: bandages place at either end of incision;Multiple areas of blacked skin: lack sensation on blackened area, dull sensation surrounding areas; blackened area and surrounding area firm to palpation; remainder of abdomen feels a little swollen with a few firm spots of potential fat necrosis.  Some drainage apparent on  bandages covering abdomen.  Wearing spanx. (photos in chart 12/23)  Musculoskeletal:        General: Normal range of motion.     Cervical back: Normal range of motion.  Neurological: She is alert and oriented to person, place, and time.  Skin: Skin is warm.  Psychiatric: She has a normal mood and affect. Her behavior is normal. Judgment and thought content normal.    Assessment/Plan: Ms. Bourne has multiple blackened areas of skin on her abdomen following TriSculpt liposuction and abdominoplasty procedure on 12/10 with Dr. Theda Sers at [facility]. Blackened areas have no sensation, surrounding areas have dulled sensation and both are firm to the touch. Areas of suspected fluid felt upon palpation. Other areas of the abdomen are softer but feel a bit swollen with some areas of potential fat necrosis.  CT   Recommend continue IV antibiotics and treatment of wounds with silvadene.  Will review case further with plastic surgery team .  Appreciate the opportunity to consult with this patient.   Threasa Heads, PA-C 09/18/2019, 12:41 PM

## 2019-09-19 LAB — AEROBIC CULTURE W GRAM STAIN (SUPERFICIAL SPECIMEN)

## 2019-09-19 MED ORDER — SILVER SULFADIAZINE 1 % EX CREA: TOPICAL_CREAM | Freq: Two times a day (BID) | CUTANEOUS | 0 refills | Status: AC

## 2019-09-19 MED ORDER — FLEET ENEMA 7-19 GM/118ML RE ENEM
1.0000 | ENEMA | Freq: Once | RECTAL | Status: DC
  Filled 2019-09-19: qty 1

## 2019-09-19 MED ORDER — MAGNESIUM CITRATE PO SOLN: 1.0000 | Freq: Once | ORAL | Status: DC

## 2019-09-19 MED ORDER — DOCUSATE SODIUM 100 MG PO CAPS: 100.0000 mg | ORAL_CAPSULE | Freq: Two times a day (BID) | ORAL | 0 refills | Status: DC

## 2019-09-19 MED ORDER — AMOXICILLIN-POT CLAVULANATE 875-125 MG PO TABS: 1.0000 | ORAL_TABLET | Freq: Two times a day (BID) | ORAL | 0 refills | Status: DC

## 2019-09-19 MED ORDER — DOCUSATE SODIUM 100 MG PO CAPS: 100.0000 mg | ORAL_CAPSULE | Freq: Two times a day (BID) | ORAL | Status: DC

## 2019-09-19 MED ORDER — OXYCODONE HCL 5 MG PO TABS: 5.0000 mg | ORAL_TABLET | ORAL | 0 refills | Status: DC | PRN

## 2019-09-19 MED ORDER — DOCUSATE SODIUM 100 MG PO CAPS: 100.0000 mg | ORAL_CAPSULE | Freq: Two times a day (BID) | ORAL | 2 refills | Status: AC

## 2019-09-19 MED ORDER — AMOXICILLIN-POT CLAVULANATE 875-125 MG PO TABS: 1.0000 | ORAL_TABLET | Freq: Two times a day (BID) | ORAL | 0 refills | Status: AC

## 2019-09-19 MED ORDER — OXYCODONE HCL 5 MG PO TABS: 5.0000 mg | ORAL_TABLET | ORAL | 0 refills | Status: AC | PRN

## 2019-09-19 NOTE — Progress Notes (Signed)
Subjective/Chief Complaint: Pt states she feels better today     Objective: Vital signs in last 24 hours: Temp:  [98.3 F (36.8 C)-101.5 F (38.6 C)] 98.3 F (36.8 C) (12/25 0311) Pulse Rate:  [79-85] 79 (12/25 0311) Resp:  [17] 17 (12/24 1930) BP: (106-117)/(70-76) 117/70 (12/25 0311) SpO2:  [97 %-99 %] 99 % (12/25 0311) Last BM Date: 09/18/19  Intake/Output from previous day: 12/24 0701 - 12/25 0700 In: 1687.9 [P.O.:480; I.V.:806.8; IV Piggyback:401.1] Out: -  Intake/Output this shift: No intake/output data recorded.  PE: Constitutional: No acute distress, conversant, appears states age. Eyes: Anicteric sclerae, moist conjunctiva, no lid lag Lungs: Clear to auscultation bilaterally, normal respiratory effort CV: regular rate and rhythm, no murmurs, no peripheral edema, pedal pulses 2+ GI: Soft, no masses or hepatosplenomegaly, non-tender to palpation, abdominal superficial skin appears necrotic and some sloughing, no drainage Skin: No rashes, palpation reveals normal turgor Psychiatric: appropriate judgment and insight, oriented to person, place, and time    Lab Results:  Recent Labs    09/17/19 1302 09/18/19 0225  WBC 5.8 5.1  HGB 11.8* 11.4*  HCT 35.0* 33.4*  PLT 338 289   BMET Recent Labs    09/17/19 1302 09/18/19 0225  NA 140 139  K 3.9 3.3*  CL 109 109  CO2 23 21*  GLUCOSE 94 115*  BUN 5* 5*  CREATININE 0.74 0.88  CALCIUM 8.4* 8.2*   PT/INR No results for input(s): LABPROT, INR in the last 72 hours. ABG No results for input(s): PHART, HCO3 in the last 72 hours.  Invalid input(s): PCO2, PO2  Studies/Results: CT Abdomen Pelvis W Contrast  Result Date: 09/17/2019 CLINICAL DATA:  Liposuction performed 09/04/2019, nonhealing wounds and cellulitis EXAM: CT ABDOMEN AND PELVIS WITH CONTRAST TECHNIQUE: Multidetector CT imaging of the abdomen and pelvis was performed using the standard protocol following bolus administration of intravenous  contrast. CONTRAST:  131mL OMNIPAQUE IOHEXOL 300 MG/ML  SOLN COMPARISON:  None. FINDINGS: Lower chest: Lung bases are clear. Normal heart size. No pericardial effusion. Hepatobiliary: No focal liver abnormality is seen. No gallstones, gallbladder wall thickening, or biliary dilatation. Pancreas: Unremarkable. No pancreatic ductal dilatation or surrounding inflammatory changes. Spleen: Normal in size without focal abnormality. Adrenals/Urinary Tract: Adrenal glands are unremarkable. Left extrarenal pelvis. Kidneys are otherwise normal, without renal calculi, focal lesion, or hydronephrosis. Bladder is unremarkable. Stomach/Bowel: Distal esophagus, stomach and duodenal sweep are unremarkable. No small bowel wall thickening or dilatation. No evidence of obstruction. A normal appendix is visualized. No colonic dilatation or wall thickening. Vascular/Lymphatic: The aorta is normal caliber. No suspicious or enlarged lymph nodes in the included lymphatic chains. Reproductive: Anteverted uterus. Tiny fundal fibroid. Radiopaque IUD is malpositioned with the right arm extending proud of the serosal surface and into the peritoneum. Other: There is extensive overlying skin thickening and scattered sites of ulceration along the anterior abdomen as well as the lateral and posterior flanks with subjacent extensive fast stranding and loculated fluid collections some of which demonstrate some thin rim enhancement. A punctate focus of gas is seen within a collection along the right abdominal wall (2/44). Swelling and edema extends to the fascial planes of the rectus sheath. Musculoskeletal: Uniform enhancement of the abdominal musculature at this time. Inflammation in fluid seen tracking along the superficial fascial planes of the abdominal wall. No acute osseous abnormality or suspicious osseous lesion. IMPRESSION: 1. Extensive overlying skin thickening and scattered sites of ulceration along the anterior abdomen as well as the  lateral and posterior  flanks with subjacent extensive fast stranding and loculated fluid collections some of which demonstrate some thin rim enhancement. A punctate focus of gas is seen within a collection along the right abdominal wall (2/44). Findings are itis compatible with cellulitis and the presence of gas within the soft tissues given time since operation (13 days) is highly worrisome for underlying infection including potential necrotizing soft tissue infection. Urgent surgical consultation is warranted. 2. Swelling and edema extends to the fascial planes of the rectus sheath at this time. No intraperitoneal inflammation is seen. 3. Radiopaque IUD is malpositioned with the right arm extending proud of the serosal surface of the uterine fundus and extending into the peritoneum. Critical Value/emergent results were called by telephone at the time of interpretation on 09/17/2019 at 8:36 pm to [facility] , who verbally acknowledged these results. Electronically Signed   By: Lovena Le M.D.   On: 09/17/2019 20:36    Anti-infectives: Anti-infectives (From admission, onward)   Start     Dose/Rate Route Frequency Ordered Stop   09/18/19 1130  vancomycin (VANCOCIN) IVPB 750 mg/150 ml premix     750 mg 150 mL/hr over 60 Minutes Intravenous Every 12 hours 09/18/19 1106     09/18/19 0330  piperacillin-tazobactam (ZOSYN) IVPB 3.375 g     3.375 g 12.5 mL/hr over 240 Minutes Intravenous Every 8 hours 09/17/19 2103     09/17/19 2115  piperacillin-tazobactam (ZOSYN) IVPB 3.375 g     3.375 g 100 mL/hr over 30 Minutes Intravenous  Once 09/17/19 2103 09/17/19 2328   09/17/19 2100  clindamycin (CLEOCIN) IVPB 900 mg     900 mg 100 mL/hr over 30 Minutes Intravenous  Once 09/17/19 2051 09/17/19 2228   09/17/19 1830  vancomycin (VANCOCIN) IVPB 1000 mg/200 mL premix  Status:  Discontinued     1,000 mg 200 mL/hr over 60 Minutes Intravenous Every 12 hours 09/17/19 1819 09/18/19 1106       Assessment/Plan: Cellulitis/ skin necrosis after recent liposuction and abdominoplasty by Dr. Theda Sers of [place], [place]    Plan:  -con't IV antibiotics -adv to reg diet -appreciate GYN input for IUD -Will allow PRS determine need for surgery and timing of DC.  Dr. Marla Roe to see today.   LOS: 1 day    Ralene Ok 09/19/2019

## 2019-09-19 NOTE — Discharge Instructions (Signed)
Please follow up with previous surgeon. If unable to make an appointment, you may follow up with Dr. Audelia Hives.

## 2019-09-19 NOTE — Plan of Care (Signed)
  Problem: Skin Integrity: Goal: Risk for impaired skin integrity will decrease Outcome: Progressing   Problem: Coping: Goal: Level of anxiety will decrease Outcome: Progressing   

## 2019-09-22 LAB — CULTURE, BLOOD (ROUTINE X 2)
Culture: NO GROWTH
Culture: NO GROWTH
Special Requests: ADEQUATE

## 2019-09-22 MED FILL — QSYMIA 7.5 MG-46 MG CAPSULE: 7.5-46 | 30 days supply | Qty: 30 | Fill #0

## 2019-10-01 NOTE — Discharge Summary (Signed)
Dorothy Cisneros   Patient ID: Dorothy Cisneros MRN: [id number] DOB/AGE: 40-Feb-1982 40 y.o.  Admit date: 09/17/2019 Discharge date: 10/01/2019  Admitting Diagnosis: Cellulitis/ skin necrosis after recent liposuction and abdominoplasty by Dr. Theda Sers of [place], [place]   Discharge Diagnosis Patient Active Problem List   Diagnosis Date Noted  . Abdominal wall cellulitis 09/17/2019  . Status post repeat low transverse cesarean section 08/02/2017  . Postpartum care following cesarean delivery (10/08) 06/07/2016    Consultants Plastic surgery  Imaging: No results found.  Procedures None  Hospital Course:  Dorothy Cisneros is a 39yo female with sickle trait presents after recent plastic surgery on 09/04/19.  We have no records to review tonight, but the patient reports liposuction and limited abdominoplasty performed at [facility] in [place] by Dr. Suan Halter.  The patient began experiencing fever two days post-op and has been on Keflex since surgery with no missed doses.  She has had two days of fever over 101 and has been taking Percocet regularly.  Apparently, her PCP gave her Silvadene ointment to treat the discolored areas on her abdomen since 12/17.  She presented to the [facility] ED around 1 pm 12/23 complaining mainly of fever and chills.  The anterior abdomen is numb with decreased sensation around the wounds. No signs of sepsis in the ED - currently afebrile, not tachycardic, normotensive, normal WBC. She was admitted and started on IV antibiotics. Plastic surgery consulted and recommended continuing IV antibiotics and treatment of wounds with silvadene.  On 12/25 the patient remained stable and felt stable for discharge home. She was sent home with 14 days of augmentin. Patient will follow up with plastic surgery as below and knows to call with questions or concerns.    I was not directly involved in this patient's care therefore the information in this  discharge Cisneros was taken from the chart.   Allergies as of 09/19/2019      Reactions   Crab [shellfish Allergy] Anaphylaxis, Other (See Comments)   Tongue swelling      Medication List    STOP taking these medications   cephALEXin 500 MG capsule Commonly known as: KEFLEX     TAKE these medications   amoxicillin-clavulanate 875-125 MG tablet Commonly known as: Augmentin Take 1 tablet by mouth 2 (two) times daily for 14 days.   cholecalciferol 25 MCG (1000 UT) tablet Commonly known as: VITAMIN D3 Take 1,000 Units by mouth daily.   docusate sodium 100 MG capsule Commonly known as: COLACE Take 1 capsule (100 mg total) by mouth 2 (two) times daily.   oxyCODONE 5 MG immediate release tablet Commonly known as: Oxy IR/ROXICODONE Take 1-2 tablets (5-10 mg total) by mouth every 4 (four) hours as needed for moderate pain.   silver sulfADIAZINE 1 % cream Commonly known as: SILVADENE Apply 1 application topically 2 (two) times daily. What changed: Another medication with the same name was added. Make sure you understand how and when to take each.   silver sulfADIAZINE 1 % cream Commonly known as: SILVADENE Apply topically 2 (two) times daily. What changed: You were already taking a medication with the same name, and this prescription was added. Make sure you understand how and when to take each.        Follow-up Information    Dillingham, Dorothy Lofty, DO. Schedule an appointment as soon as possible for a visit in 1 week(s).   Specialty: Plastic Surgery Why: Please make an appointment with Dr. Marla Roe if  you are unable to make an apppointment with your surgeon.  Contact information: [address] [place] [address] [phone]           Signed: Wellington Cisneros, [facility] 10/01/2019, 3:39 PM Please see Amion for pager number during day hours 7:00am-4:30pm

## 2019-10-03 ENCOUNTER — Institutional Professional Consult (permissible substitution): Payer: BC Managed Care – PPO | Admitting: Plastic Surgery

## 2019-10-03 ENCOUNTER — Encounter: Payer: Self-pay | Admitting: Plastic Surgery

## 2019-10-03 ENCOUNTER — Other Ambulatory Visit: Payer: Self-pay

## 2019-10-03 ENCOUNTER — Ambulatory Visit (INDEPENDENT_AMBULATORY_CARE_PROVIDER_SITE_OTHER): Payer: BC Managed Care – PPO | Admitting: Plastic Surgery

## 2019-10-03 VITALS — BP 128/83 | HR 90 | Temp 97.9°F | Ht 64.0 in | Wt 172.0 lb

## 2019-10-03 DIAGNOSIS — L03311 Cellulitis of abdominal wall: Secondary | ICD-10-CM

## 2019-10-03 MED FILL — QSYMIA 7.5 MG-46 MG CAPSULE: 7.5-46 | 30 days supply | Qty: 30 | Fill #0

## 2019-10-03 NOTE — Progress Notes (Signed)
The patient is a 39 year old female here for follow-up.  She was here in the inpatient setting as a consult.  She went to [place] earlier in December and underwent an abdominal liposuction.  She had some complications and was being followed.  She then ended up in the emergency room for cellulitis.  She was treated with antibiotic and discharged.  She has been using Silvadene to the wounds.  She is still in communication with the surgeons in [place].  I recommend her continuing with them.  We discussed what I would do if faced with this challenge.  Generally speaking I would do an excision of the eschar, placement of ACell and allow it time to heal.  The cellulitis was markedly improved on today's visit as well as the swelling.  The might need a skin graft and we talked about that.  Giving it time to heal on its own may improve the scar.  If there is any increased redness or swelling then I would be concerned about infection starting and would recommend that she seek care.

## 2019-10-05 ENCOUNTER — Encounter: Payer: Self-pay | Admitting: Plastic Surgery

## 2019-10-08 MED FILL — GABAPENTIN 300 MG CAPSULE: 300 | 30 days supply | Qty: 90 | Fill #0

## 2019-10-13 MED FILL — SANTYL OINTMENT: 250 | 30 days supply | Qty: 90 | Fill #0

## 2020-06-29 ENCOUNTER — Other Ambulatory Visit (HOSPITAL_COMMUNITY): Payer: Self-pay | Admitting: Obstetrics

## 2020-06-29 MED FILL — miSOPROStol 200 MCG TABS: 200 | 1 days supply | Qty: 3 | Fill #0

## 2020-06-29 MED FILL — IBUPROFEN 800 MG TABS: 800 | 10 days supply | Qty: 30 | Fill #0

## 2020-06-29 MED FILL — PROMETHAZINE 25 MG TABLET: 25 | 2 days supply | Qty: 12 | Fill #0

## 2020-06-30 MED FILL — OXYCODONE-APAP 5-325MG: 5-325 | 2 days supply | Qty: 10 | Fill #0

## 2020-07-12 MED FILL — IBUPROFEN 800 MG TABS: 800 | 10 days supply | Qty: 30 | Fill #0

## 2020-07-12 MED FILL — miSOPROStol 200 MCG TABS: 200 | 3 days supply | Qty: 3 | Fill #0

## 2020-08-30 ENCOUNTER — Ambulatory Visit: Payer: Self-pay | Admitting: Internal Medicine

## 2020-09-13 IMAGING — CT CT ABD-PELV W/ CM
2 of 4 series · 15 of 46 positions shown, 17 images · IV contrast (Omni 300)
Comparison: None.

CLINICAL DATA: Liposuction performed 09/04/2019, nonhealing wounds
and cellulitis

EXAM:
CT ABDOMEN AND PELVIS WITH CONTRAST
TECHNIQUE: Multidetector CT imaging of the abdomen and pelvis was performed
using the standard protocol following bolus administration of
intravenous contrast.
CONTRAST:  100mL OMNIPAQUE IOHEXOL 300 MG/ML  SOLN

[Series 2: a/p w/ 5mm · axial · 0.98mm/px · z∈[+557,+917]mm · 12 of 83 slices shown, 14 images]
[im 7/83  soft-tissue]
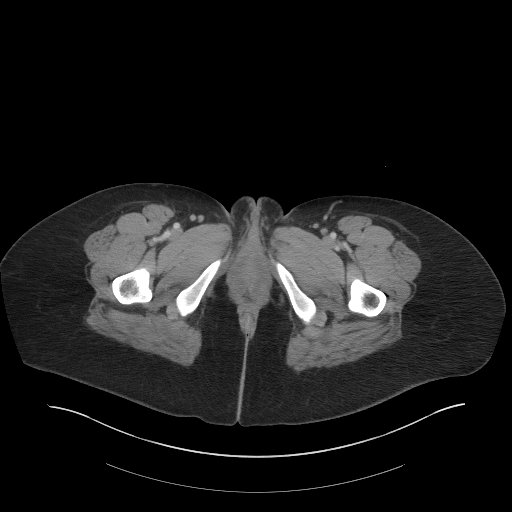
[im 7/83  bone]
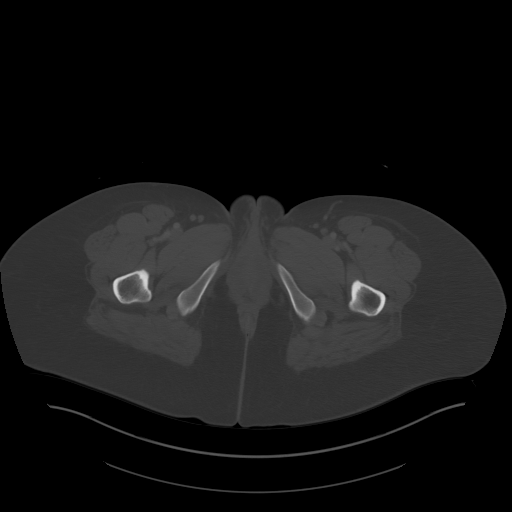
[im 14/83  soft-tissue]
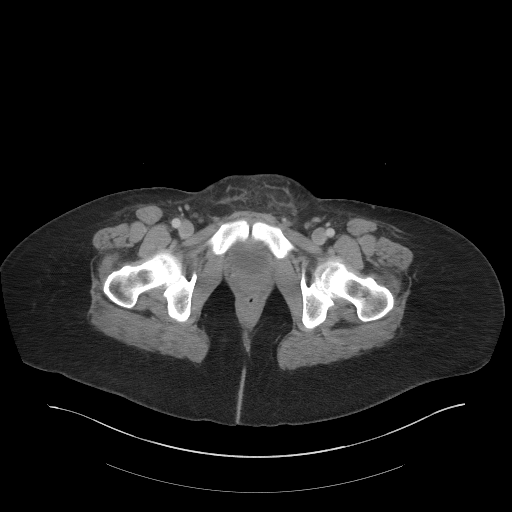
[im 20/83  soft-tissue]
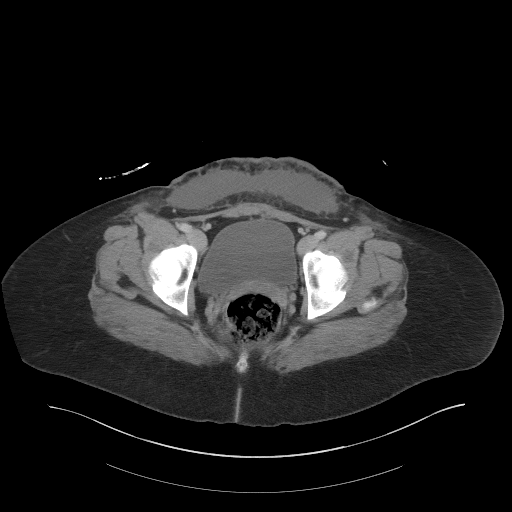
[im 27/83  soft-tissue]
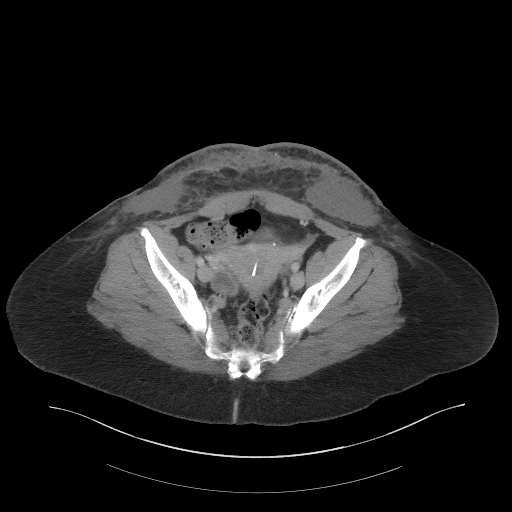
[im 33/83  soft-tissue]
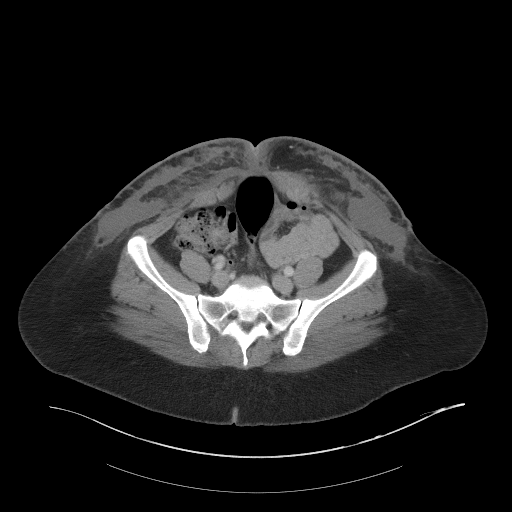
[im 40/83  soft-tissue]
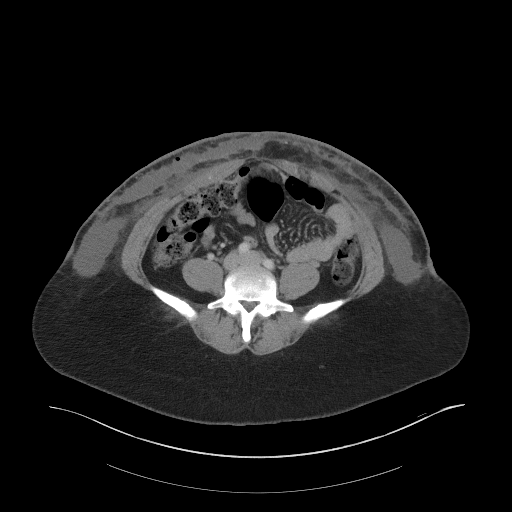
[im 46/83  soft-tissue]
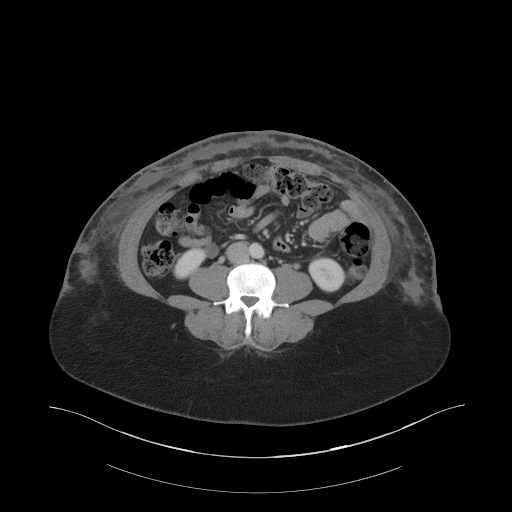
[im 53/83  soft-tissue]
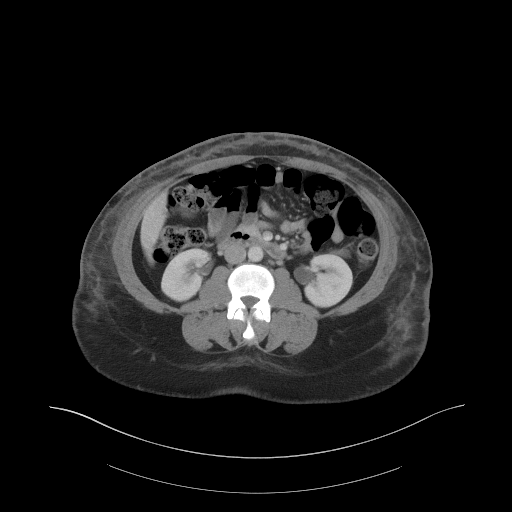
[im 60/83  soft-tissue]
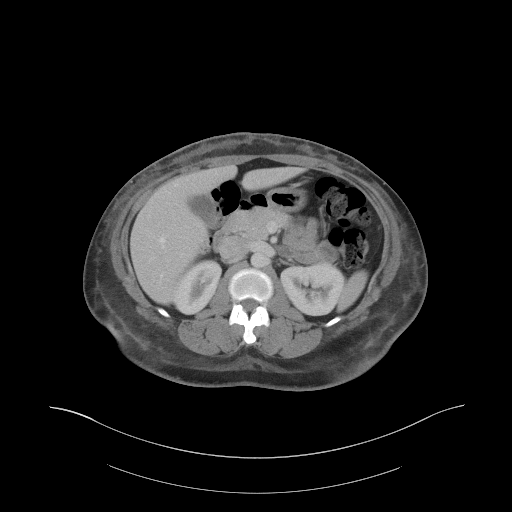
[im 60/83  bone]
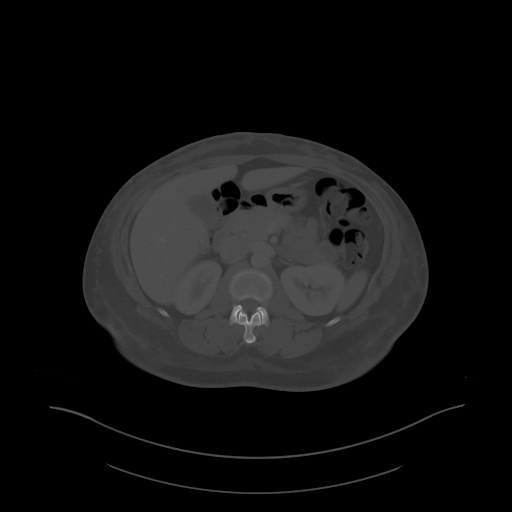
[im 66/83  soft-tissue]
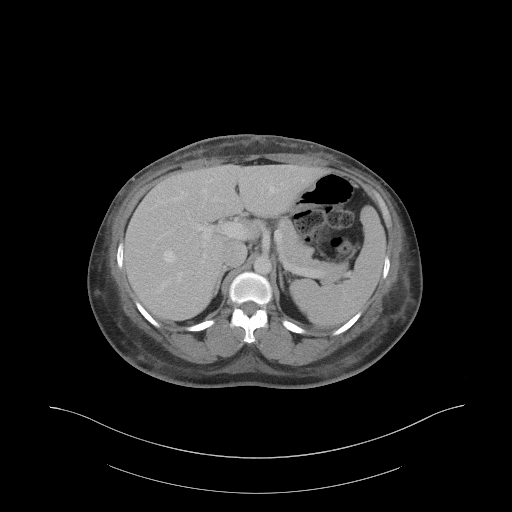
[im 73/83  soft-tissue]
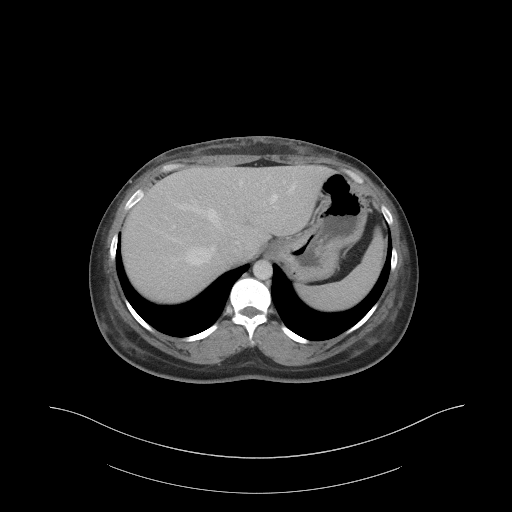
[im 79/83  soft-tissue]
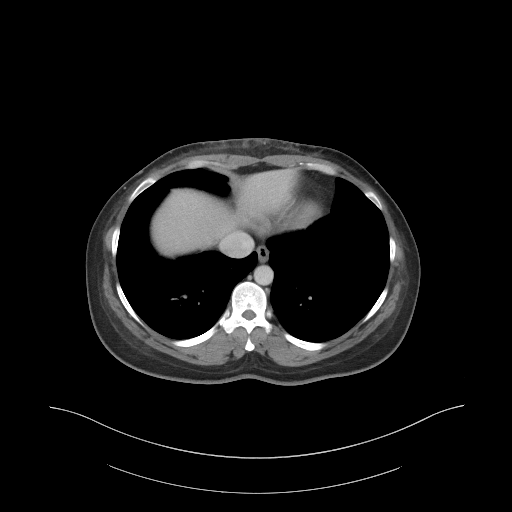

[Series 5: a/p w/ cor · coronal · 0.88mm/px · 3 of 173 slices shown]
[im 58/173  soft-tissue]
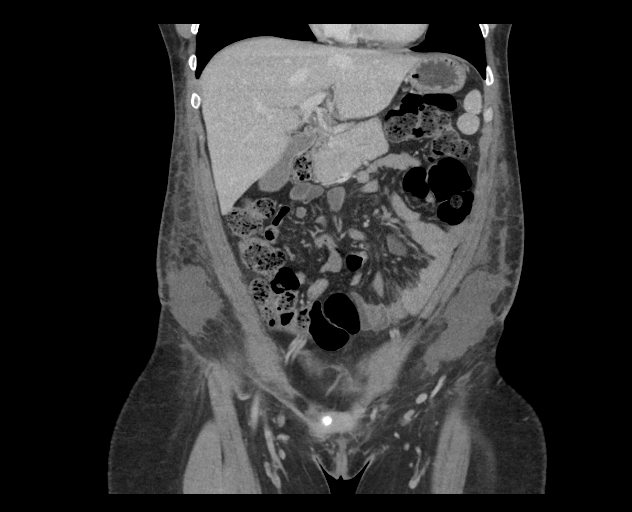
[im 77/173  soft-tissue]
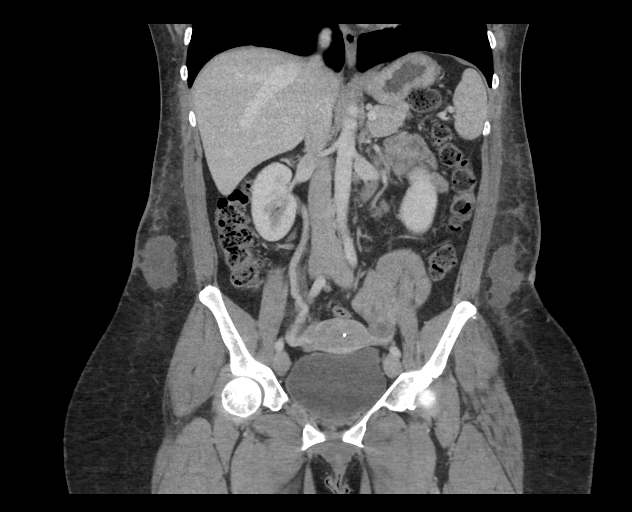
[im 96/173  soft-tissue]
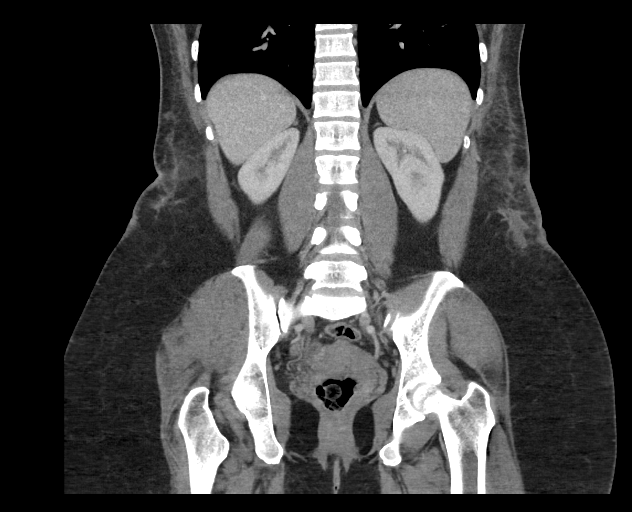

[15 of 46 positions shown; findings below may reference images not displayed]

FINDINGS: Lower chest: Lung bases are clear. Normal heart size. No pericardial
effusion.

Hepatobiliary: No focal liver abnormality is seen. No gallstones,
gallbladder wall thickening, or biliary dilatation.

Pancreas: Unremarkable. No pancreatic ductal dilatation or
surrounding inflammatory changes.

Spleen: Normal in size without focal abnormality.

Adrenals/Urinary Tract: Adrenal glands are unremarkable. Left
extrarenal pelvis. Kidneys are otherwise normal, without renal
calculi, focal lesion, or hydronephrosis. Bladder is unremarkable.

Stomach/Bowel: Distal esophagus, stomach and duodenal sweep are
unremarkable. No small bowel wall thickening or dilatation. No
evidence of obstruction. A normal appendix is visualized. No colonic
dilatation or wall thickening.

Vascular/Lymphatic: The aorta is normal caliber. No suspicious or
enlarged lymph nodes in the included lymphatic chains.

Reproductive: Anteverted uterus. Tiny fundal fibroid. Radiopaque IUD
is malpositioned with the right arm extending proud of the serosal
surface and into the peritoneum.

Other: There is extensive overlying skin thickening and scattered
sites of ulceration along the anterior abdomen as well as the
lateral and posterior flanks with subjacent extensive fast stranding
and loculated fluid collections some of which demonstrate some thin
rim enhancement. A punctate focus of gas is seen within a collection
along the right abdominal wall (2/44). Swelling and edema extends to
the fascial planes of the rectus sheath.

Musculoskeletal: Uniform enhancement of the abdominal musculature at
this time. Inflammation in fluid seen tracking along the superficial
fascial planes of the abdominal wall. No acute osseous abnormality
or suspicious osseous lesion.
IMPRESSION: 1. Extensive overlying skin thickening and scattered sites of
ulceration along the anterior abdomen as well as the lateral and
posterior flanks with subjacent extensive fast stranding and
loculated fluid collections some of which demonstrate some thin rim
enhancement. A punctate focus of gas is seen within a collection
along the right abdominal wall (2/44). Findings are itis compatible
with cellulitis and the presence of gas within the soft tissues
given time since operation (13 days) is highly worrisome for
underlying infection including potential necrotizing soft tissue
infection. Urgent surgical consultation is warranted.
2. Swelling and edema extends to the fascial planes of the rectus
sheath at this time. No intraperitoneal inflammation is seen.
3. Radiopaque IUD is malpositioned with the right arm extending
proud of the serosal surface of the uterine fundus and extending
into the peritoneum.

Critical Value/emergent results were called by telephone at the time
of interpretation on 09/17/2019 at [DATE] to Jesus Walker Dudu
PETERKIN , who verbally acknowledged these results.

## 2020-11-02 NOTE — Progress Notes (Signed)
Received an inquiry that this patient is interested in becoming a new patient at [facility].  Attempted to contact the patient and left a voicemail for patient to return our call if they're still interested in becoming a patient.

## 2020-12-21 ENCOUNTER — Ambulatory Visit: Payer: Self-pay | Admitting: Primary Care

## 2021-04-21 ENCOUNTER — Telehealth: Payer: Self-pay | Admitting: Internal Medicine

## 2021-04-21 NOTE — Telephone Encounter (Signed)
Left VM to call our office to schedule NPV with Dr. Arden.

## 2021-07-04 ENCOUNTER — Ambulatory Visit: Payer: PRIVATE HEALTH INSURANCE | Admitting: Internal Medicine

## 2021-07-05 LAB — UNMAPPED LAB RESULTS
Hematocrit (HT): 41 % (ref 34–47)
Hemoglobin (HGB) (HT): 14 g/dL (ref 11.5–16.0)
MCHC (HT): 34.2 g/dL (ref 32.0–36.0)
MCV (HT): 84.2 fL (ref 81.0–99.0)
Mean Corpuscular Hemoglobin (MCH) (HT): 28.8 pg (ref 26.0–34.0)
Platelets (HT): 323 10 3/uL (ref 140–400)
RBC (HT): 4.86 10 6/uL (ref 3.80–5.20)
RDW (HT): 12.7 % (ref 11.5–15.0)
WBC (HT): 5.9 10 3/uL (ref 4.0–10.8)

## 2021-07-11 LAB — UNMAPPED LAB RESULTS: HIV 1&2 ANTIGEN/ANTIBODY (HT): NONREACTIVE

## 2021-08-15 ENCOUNTER — Other Ambulatory Visit
Admission: RE | Admit: 2021-08-15 | Discharge: 2021-08-15 | Disposition: A | Discharge status: Home / Self Care | Payer: 59 | Source: Ambulatory Visit

## 2021-08-15 ENCOUNTER — Ambulatory Visit: Payer: 59 | Admitting: Internal Medicine

## 2021-08-15 ENCOUNTER — Encounter: Payer: Self-pay | Admitting: Internal Medicine

## 2021-08-15 ENCOUNTER — Ambulatory Visit
Admission: RE | Admit: 2021-08-15 | Discharge: 2021-08-15 | Disposition: A | Discharge status: Home / Self Care | Payer: 59 | Source: Ambulatory Visit | Attending: Internal Medicine | Admitting: Internal Medicine

## 2021-08-15 ENCOUNTER — Other Ambulatory Visit: Payer: Self-pay

## 2021-08-15 VITALS — BP 122/84 | HR 60 | Ht 64.0 in | Wt 202.0 lb

## 2021-08-15 DIAGNOSIS — R7303 Prediabetes: Secondary | ICD-10-CM

## 2021-08-15 DIAGNOSIS — N920 Excessive and frequent menstruation with regular cycle: Secondary | ICD-10-CM | POA: Insufficient documentation

## 2021-08-15 DIAGNOSIS — E669 Obesity, unspecified: Secondary | ICD-10-CM | POA: Insufficient documentation

## 2021-08-15 DIAGNOSIS — M47817 Spondylosis without myelopathy or radiculopathy, lumbosacral region: Secondary | ICD-10-CM

## 2021-08-15 DIAGNOSIS — F32A Depression, unspecified: Secondary | ICD-10-CM

## 2021-08-15 DIAGNOSIS — R5383 Other fatigue: Secondary | ICD-10-CM | POA: Insufficient documentation

## 2021-08-15 DIAGNOSIS — Z23 Encounter for immunization: Secondary | ICD-10-CM

## 2021-08-15 DIAGNOSIS — G8929 Other chronic pain: Secondary | ICD-10-CM

## 2021-08-15 DIAGNOSIS — M545 Low back pain, unspecified: Secondary | ICD-10-CM

## 2021-08-15 DIAGNOSIS — Z1239 Encounter for other screening for malignant neoplasm of breast: Secondary | ICD-10-CM

## 2021-08-15 LAB — COMPREHENSIVE METABOLIC PANEL
ALT: 22 U/L (ref 0–35)
AST: 23 U/L (ref 0–35)
Albumin: 4.7 g/dL (ref 3.5–5.2)
Alk Phos: 58 U/L (ref 35–105)
Anion Gap: 10 (ref 7–16)
Bilirubin,Total: 0.4 mg/dL (ref 0.0–1.2)
CO2: 28 mmol/L (ref 20–28)
Calcium: 10.1 mg/dL (ref 8.8–10.2)
Chloride: 104 mmol/L (ref 96–108)
Creatinine: 0.74 mg/dL (ref 0.51–0.95)
Glucose: 84 mg/dL (ref 60–99)
Lab: 13 mg/dL (ref 6–20)
Potassium: 4.6 mmol/L (ref 3.3–5.1)
Sodium: 142 mmol/L (ref 133–145)
Total Protein: 7.5 g/dL (ref 6.3–7.7)
eGFR BY CREAT: 104 *

## 2021-08-15 LAB — TIBC
Iron: 101 ug/dL (ref 34–165)
TIBC: 386 ug/dL (ref 250–450)
Transferrin Saturation: 26 % (ref 15–50)

## 2021-08-15 LAB — CBC AND DIFFERENTIAL
Baso # K/uL: 0.1 10*3/uL (ref 0.0–0.1)
Basophil %: 0.9 %
Eos # K/uL: 0.6 10*3/uL — ABNORMAL HIGH (ref 0.0–0.4)
Eosinophil %: 7.8 %
Hematocrit: 44 % (ref 34–45)
Hemoglobin: 14.6 g/dL (ref 11.2–15.7)
IMM Granulocytes #: 0 10*3/uL (ref 0.0–0.0)
IMM Granulocytes: 0.5 %
Lymph # K/uL: 3.8 10*3/uL — ABNORMAL HIGH (ref 1.2–3.7)
Lymphocyte %: 46.9 %
MCH: 29 pg (ref 26–32)
MCHC: 33 g/dL (ref 32–36)
MCV: 87 fL (ref 79–95)
Mono # K/uL: 0.5 10*3/uL (ref 0.2–0.9)
Monocyte %: 5.6 %
Neut # K/uL: 3.1 10*3/uL (ref 1.6–6.1)
Nucl RBC # K/uL: 0 10*3/uL (ref 0.0–0.0)
Nucl RBC %: 0 /100 WBC (ref 0.0–0.2)
Platelets: 364 10*3/uL (ref 160–370)
RBC: 5.1 MIL/uL (ref 3.9–5.2)
RDW: 12.9 % (ref 11.7–14.4)
Seg Neut %: 38.3 %
WBC: 8 10*3/uL (ref 4.0–10.0)

## 2021-08-15 LAB — LIPID PANEL
Chol/HDL Ratio: 3.7 (ref 0.0–4.4)
Cholesterol: 257 mg/dL — AB
HDL: 69 mg/dL — ABNORMAL HIGH (ref 40–60)
LDL Calculated: 168 mg/dL — AB
Non HDL Cholesterol: 188 mg/dL
Triglycerides: 99 mg/dL

## 2021-08-15 LAB — TSH: TSH: 0.94 u[IU]/mL (ref 0.27–4.20)

## 2021-08-15 LAB — FERRITIN: Ferritin: 69 ng/mL (ref 10–120)

## 2021-08-15 MED ORDER — BUPROPION HCL 150 MG PO TB24 *I*
150.0000 mg | ORAL_TABLET | Freq: Every morning | ORAL | 5 refills | Status: DC
Start: 2021-08-15 — End: 2021-10-13

## 2021-08-15 MED ORDER — MELOXICAM 7.5 MG PO TABS *I*
7.5000 mg | ORAL_TABLET | Freq: Every day | ORAL | 5 refills | Status: DC
Start: 2021-08-15 — End: 2024-07-21

## 2021-08-15 NOTE — Progress Notes (Signed)
[facility] NOTE    Past Medical History: does not have a problem list on file.      Medications:   Current Outpatient Medications   Medication Sig    gabapentin (NEURONTIN) 300 mg capsule Take 300 mg by mouth 3 times daily as needed    phentermine-topiramate (QSYMIA) 7.5-46 MG per capsule Take 1 capsule by mouth every morning     No current facility-administered medications for this visit.        Allergies:   Allergies   Allergen Reactions    Shellfish-Derived Products Anaphylaxis    Cinnamon Itching       HPI/ROS:     Theresa Bullock is a 40 yo F class 1 obesity presenting to establish care.    Obesity   - Still taking phentermine-topiramate 7.5-46 mg every morning.  She reports that she has some drowsiness and dry mouth with taking it.  Has been on since 2021.  Reports she's lost some weight, but hasn't in some time.  Has never been on anything else.  - Moved to [place] in October, moved based on husband having a job here -- he's now working in [employer], going back-and-forth.  - Has contacted a dietician, working on a plan, had one visit already.  - 24 Diet Recall: Cheese cubes and animal crackers and water - spread out throughout the day.  Craving the salt.  Tends to be more of a grazer, hasn't been able to get grocery shopping.  - Exercise: None.  Reports she is trying to get back to 30 min walking per day, reports she's been able to do that the last 3 days.  - Reports heaviest she's ever been, since pregnancy.    Back Pain  - Reports she has more back pain when standing for longer than 15 minutes at a time.  She has been having the back pain for about 5 years, progressively worse over the past year.  - Located in low back.  No radiation.  No pain shooting down the leg.  No muscle weakness.  No change in urinary or fecal habits.    - Pain will last throughout the day unless uses a hot pack or sits down (lying down doesn't help).  Has not been taking anything else for pain.  Has not tried anything for the  pain.  Hasn't taken acetaminophen because it makes her drowsy (500 mg).  Hasn't taken an NSAID.  Reports ibuprofen makes her drowsy as well (600 mg).    - Pain eases up with hot pack or with sitting.  Reports worse with bending, sweeping.      Complications of Liposuction (2020)  - She had a liposuction and partial abdominoplasty in 2020, complicated by nerve pain from the surgical complication, has currently been taking it as needed only taking gabapentin once every other day (occasionally twice daily with a flare).      PSH  - Liposuction + abdominoplasty 2020  - C-section x2 (2017, 2018)    PObHx  - 2 pregnancies, 2 c-sections.  IVF for first pregnancy, c/b pre-eclampsia.  Second pregnancy, concerned re pre-eclampsia but didn't have it.  - Previously had procedure for IUD that punctured the endometrium, surgically removed (2021).  Has a history of fibroids, so IUD was placed both for contraception and for fibroid mgmt, reports 3-6 days moderate bleeding per menses    FHx:   - Notable for DM in mother & father; HTN in father, MGM; Breast cancer in mother +  maternal aunt.  Updated in eRecord    Social History   - Moved up here in October 2021 for work.  Daughter Theresa Bullock.  Her husband is up here for Thanksgiving.  -  Her son was recently diagnosed with autism, finally got him into a preK program  - No EtOH currently.  Never smoker.  No illicits.  - Interested in going back to school, as a Garment/textile technologist.  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    - Describes mood as barely managing.  Has felt depressed.  Had intermittent feelings of depression, never post-partum depression.  Feeling overwhelmed that just here alone, no family or friends up here.  Reports she is feeling constantly tired.  Has been sleeping a lot more recently.  Has been eating a lot when stressed.  Only has feelings of guilt, tied to eating habits.  No passive SI.  Has never had trouble with  anxiety    OBJECTIVE:      Vitals:    08/15/21 1105   BP: 122/84   BP Location: Right arm   Patient Position: Sitting   Cuff Size: large adult   Pulse: 60   Weight: 91.6 kg (202 lb)   Height: 1.626 m (5\' 4" )     PHYSICAL EXAM:  Constitutional: Pleasant, comfortable, NAD  ENT: Oropharynx benign, otoscopic exam benign  CV RRR, no murmur, no LE edema  Pulm: Normal WOB, CTAB, no w/r/r  Abd: Soft, NT, ND, normoactive BS  MSK  - Back: No tenderness to palpation along spine from cervical spine to coccyx.  No paraspinal tenderness in lumbar area.  Negative straight leg raise.  - Hip: Full ROM hip bilaterally without pain.    No results found for this or any previous visit (from the past 24 hour(s)).      ASSESSMENT/PLAN:   Theresa Bullock is a 40 yo F class 1 obesity presenting to establish care.  Theresa Bullock is here with low back pain and with obesity.  Her back exam is reassuring, plan to begin NSAID therapy with meloxicam, obtain x-ray.  Re her obesity, has stopped losing weight with Qsymia, doesn't take regualrly due to drowsiness; based on her depressed mood with low energy, sleeping too much, eating too much, & generalized fatigue, plan to begin Wellbutrin-XL both for mood relief and weight reduction.  If ineffective or intolerable, will attempt to obtain a GLP-1 (no contraindications)      Chronic midline low back pain without sciatica  -     Spine lumbar  AP, Lateral, Oblique views  -     Begin meloxicam (MOBIC) 7.5 mg tablet; Take 1 tablet (7.5 mg total) by mouth daily    Class 1 obesity, preDM  -     TSH; HgA1c; CMP; lipid panel  -     Begin Wellbutrin-XL 150 mg daily as below.  Stop Qsymia.    Menorrhagia  -     CBC and differential; ferritin/TIBC    Other fatigue  -     CBC and differential; TSH; CMP    Depressed mood, mild  -     Begin bupropion (Wellbutrin XL) 150 mg 24 hr tablet daily  - Discussed side effects: difficulty sleeping, increased libido, possibly anxiety/irritability    Encounter for screening  for malignant neoplasm of breast, unspecified screening modality  -     Mammography screening bilateral    Need for immunization against influenza  -  Flu vaccine quadrivalent greater than or equal to 8mo preservative free IM    Health Maintenance:  Cancer Screening  - Benign Pap, no HR-HPV (12/21/15); plan to obtain Pap smear at our office  - Reports last had a mammogram a long time ago; mammogram ordered today  Vaccinations  - Covid-19 mRNA #3 on 07/18/20  - Seasonal influenza today  - Tdap in Aug-2018    Follow up: 2 mo, earlier prn    ______________________  Glade Stanford MD

## 2021-08-16 ENCOUNTER — Encounter: Payer: Self-pay | Admitting: Internal Medicine

## 2021-08-16 LAB — HEMOGLOBIN A1C: Hemoglobin A1C: 6 % — ABNORMAL HIGH

## 2021-09-08 ENCOUNTER — Other Ambulatory Visit: Payer: Self-pay

## 2021-09-08 ENCOUNTER — Ambulatory Visit
Admission: RE | Admit: 2021-09-08 | Discharge: 2021-09-08 | Disposition: A | Discharge status: Home / Self Care | Payer: 59 | Source: Ambulatory Visit | Attending: Internal Medicine | Admitting: Internal Medicine

## 2021-09-08 DIAGNOSIS — Z803 Family history of malignant neoplasm of breast: Secondary | ICD-10-CM

## 2021-09-08 DIAGNOSIS — Z1239 Encounter for other screening for malignant neoplasm of breast: Secondary | ICD-10-CM

## 2021-09-08 DIAGNOSIS — Z1231 Encounter for screening mammogram for malignant neoplasm of breast: Secondary | ICD-10-CM | POA: Insufficient documentation

## 2021-10-13 ENCOUNTER — Other Ambulatory Visit: Payer: 59 | Admitting: Dermatology

## 2021-10-13 ENCOUNTER — Other Ambulatory Visit: Payer: Self-pay

## 2021-10-13 ENCOUNTER — Encounter: Payer: Self-pay | Admitting: Internal Medicine

## 2021-10-13 ENCOUNTER — Ambulatory Visit: Payer: 59 | Admitting: Internal Medicine

## 2021-10-13 ENCOUNTER — Other Ambulatory Visit: Payer: Self-pay | Admitting: Internal Medicine

## 2021-10-13 VITALS — BP 110/70 | HR 68 | Ht 64.0 in | Wt 199.1 lb

## 2021-10-13 DIAGNOSIS — L639 Alopecia areata, unspecified: Secondary | ICD-10-CM

## 2021-10-13 DIAGNOSIS — F32A Depression, unspecified: Secondary | ICD-10-CM

## 2021-10-13 DIAGNOSIS — L659 Nonscarring hair loss, unspecified: Secondary | ICD-10-CM

## 2021-10-13 MED ORDER — BUPROPION HCL 300 MG PO TB24 *I*
300.0000 mg | ORAL_TABLET | Freq: Every morning | ORAL | 5 refills | Status: DC
Start: 2021-10-13 — End: 2022-01-18

## 2021-10-13 MED ORDER — BETAMETHASONE DIPROPIONATE 0.05 % EX CREA *I*
TOPICAL_CREAM | CUTANEOUS | 0 refills | Status: DC
Start: 2021-10-13 — End: 2021-12-12

## 2021-10-13 NOTE — Progress Notes (Signed)
Ordering Provider Response       Appreciate Dr Thomasene Lot evaluation.  Prescription for betamethasone once weekly sent to pharmacy.  Attempted to call and didn't reach; MyChart message sent.    Glade Stanford MD

## 2021-10-13 NOTE — Progress Notes (Signed)
[facility] NOTE    Past Medical History: has Chronic midline low back pain without sciatica; Class 1 obesity; and Pre-diabetes on their problem list.      Medications:   Current Outpatient Medications   Medication Sig    phentermine-topiramate (QSYMIA) 7.5-46 MG per capsule Take 1 capsule by mouth every morning    meloxicam (MOBIC) 7.5 mg tablet Take 1 tablet (7.5 mg total) by mouth daily    buPROPion (WELLBUTRIN XL) 150 mg 24 hr tablet Take 1 tablet (150 mg total) by mouth every morning  Swallow whole. Do not crush, break, or chew.    gabapentin (NEURONTIN) 300 mg capsule Take 300 mg by mouth 3 times daily as needed     No current facility-administered medications for this visit.        Allergies:   Allergies   Allergen Reactions    Shellfish-Derived Products Anaphylaxis    Cinnamon Itching       HPI/ROS:     Theresa Bullock is a 41 yo F class 1 obesity presenting for evaluation of alopecia.      Theresa Bullock first noticed thinning in patches of hair loss over the past 6 months, worsened impressively about a month ago.  There has been some itchiness in her scalp, specifically on those spots.  She has not tried any creams.  She had eczema back in 2003, hasn't been a problem since then.      Theresa Bullock has noticed decreased appetite with the Wellbutrin, but she hasn't had any improvement in her mood.  She is still taking the Qsymia on occasion.  Husband still working in [employer].  Has been sleeping a little bit better, 6-7 hours sleep. Not feeling particularly anxious or stressed. Hasn't been able to do more walking yet.  Reports the back pain has decreased a lot.      OBJECTIVE:      Vitals:    10/13/21 1036   BP: 110/70   Pulse: 68   Weight: 90.3 kg (199 lb 1.3 oz)   Height: 1.626 m (5\' 4" )     PHYSICAL EXAM:  Constitutional: Pleasant, comfortable, NAD  Head: Two patches of alopecia on the scalp visualized  - Right scalp: 9.5x3.5cm patch of alopecia  - Left scalp: two patches - one 2 cm in diameter, one 2.5 cm in  diameter  (See photos below)  CV RRR, no murmur, no LE edema  Pulm: Normal WOB, CTAB, no w/r/r  Abd: Soft, NT, ND, normoactive BS  Skin:          No results found for this or any previous visit (from the past 24 hour(s)).      ASSESSMENT/PLAN:   Theresa Bullock is a 41 yo F class 1 obesity presenting for evaluation of alopecia.      Alopecia  -     AMB eConsult to Adult Dermatology  - After receiving eConsult, prescription for betamethasone dipropionate 0.05% once weekly sent to pharmacy.      Depression, mild  -     Begin buPROPion (WELLBUTRIN XL) 300 mg daily    Class 1 obesity, preDM, HLD  -     Begin Wellbutrin-XL 300 mg daily as above.  Stop Qsymia.  - LDL 168, HgA1c 6.0, normal TSH (08/15/21)    Health Maintenance:  Cancer Screening  - Benign Pap, no HR-HPV (12/21/15); plan to obtain Pap smear at our office at next office visit  - Normal mammogram (09/08/21)  Vaccinations  - Covid-19 mRNA #3  on 07/18/20; discussed/encouraged Covid-19 bivalent vaccine  - Seasonal influenza 08/15/21  - Tdap 05/17/17    Follow up: 3 mo for Pap, earlier prn    ______________________  Clearence Cheek MD

## 2021-10-13 NOTE — Provider Consult (Signed)
Consulting Provider Response     Dermatology E-Consult    Question    Hair loss    History  Received From Referring Provider    Photographs  Reviewed in Media Tab    Quality  Excellent    Please visit the following website to see an excellent and concise guide for taking medical photographs:  [email or web address]    Assessment    Hair loss, favor resolving alopecia areata    Triage  Recommend Semi-Urgent Referral (4 weeks)    Plan    The natural course for patchy alopecia areata is complete resolution of the course of 1 to 5 years.  This looks like an area of alopecia areata is regrowing, I do recommend in-person evaluation.  Please prescribe her betamethasone dipropionate ointment.  She can apply it to the area of hair loss once weekly while awaiting an in person visit.  We will reach out.      Time Spent: 5 minutes    This eConsult is focused on the specific clinical question(s) asked by the referring clinician, is based on the clinical data available to me, the consulting physician at the time off the request, and is furnished without benefit of a comprehensive evaluation of physical examination of the patient by me. The guidance set forth in the eConsult note will need to be interpreted in light of any clinical issues not known to me or any changes in patient status that I may not be aware of at the time of filing this eConsult. If further consultation is necessary, an in-person visit with me or another member of our group is an option.    Cindy Hazy, MD  Dermatology Attending

## 2021-10-14 NOTE — Telephone Encounter (Signed)
Pt called advised new appt time.

## 2021-11-03 ENCOUNTER — Ambulatory Visit: Payer: 59 | Admitting: Dermatology

## 2021-11-03 ENCOUNTER — Telehealth: Payer: Self-pay

## 2021-11-03 ENCOUNTER — Other Ambulatory Visit: Payer: Self-pay

## 2021-11-03 VITALS — Ht 64.0 in | Wt 199.1 lb

## 2021-11-03 DIAGNOSIS — L659 Nonscarring hair loss, unspecified: Secondary | ICD-10-CM

## 2021-11-03 DIAGNOSIS — L219 Seborrheic dermatitis, unspecified: Secondary | ICD-10-CM

## 2021-11-03 MED ORDER — KETOCONAZOLE 2 % EX SHAM *I*
MEDICATED_SHAMPOO | CUTANEOUS | 3 refills | Status: DC
Start: 2021-11-03 — End: 2022-07-10

## 2021-11-03 NOTE — Telephone Encounter (Signed)
Patient need 3 month fuv will take ct or RC

## 2021-11-03 NOTE — Patient Instructions (Signed)
-  START ketoconazole 2% shampoo once weekly (let sit for 5-10 minutes, then rinse)

## 2021-11-03 NOTE — Progress Notes (Addendum)
Dermatology Progress Note    Chief Complaint   Patient presents with    New Patient Visit     Hair loss       Derm History and Relevant Medical History:   -E-consult with Dr Nyoka Cowden 09/2021, favored AA; s/p betamethasone cream  ?  HPI:   Theresa Bullock is a pleasant 41 y.o. female here for the concern noted below.  ?  Last visit: NPV  Concern: hair loss  -started about 3 months ago, noticing specific spots on scalp on right and left side and back  -never happened before  -says intermittently itchy, not painful  -she does not see hair coming out discretely, just notices new patches of hair loss  -denies use of chemicals or tight hair styles (did weaves before this)  -says mom had some hair loss after chemotherapy  -says generally "stressed" recently  -moved here from [place] in 06/2020  -no hair loss elsewhere   -no personal or FH of autoimmune conditions  -tried betamethasone cream, unsure if did anything  -dx with "psoriasis" in past however has not had recent flare (says had biopsy of this on scalp in the past)       Physical Exam:   Vitals:    11/03/21 1346   Weight: 90.3 kg (199 lb 1.3 oz)   Height: 1.626 m (5\' 4" )     Skin: All of the following were examined, and were within normal limits, except as noted: Scalp/Hair  -patchy thinning of hair interspersed throughout scalp, no discrete areas of complete hair loss, no erythema, do not see scarring  -greasy yellow scale diffusely  ??  Labs:   -07/2021: TSH WNL    Assessment/Plan:    Hair Loss; general but patchy thinning without discrete areas to suggest active alopecia areata, not in a pattern classic for androgenetic alopecia. Does not appear to have active inflammation or signs of scarring today. Unclear etiology. Discussed addressing more prominent seborrheic dermatitis, photos today, and possible biopsy on follow up. Patient amenable.   -Tried betamethasone ointment without improvement. Can stop    Seborrheic Dermatitis - scalp  -START ketoconazole 2%  shampoo once weekly (let sit for 5-10 minutes, then rinse)    Return to clinic: 3 months    Delma Post, MD  Dermatology Resident  PGY-3    Seen with attending Dr Ernie Avena    Attending Attestation:  I saw and evaluated this patient. I agree with the resident's findings and plan of care as documented above. I was present for the key portions of the clinical history, physical exam and treatment plan.    Mare Loan, MD

## 2021-11-11 NOTE — Telephone Encounter (Signed)
Writer left detailed message with her about 02/14/22 at 9:15 at CT.

## 2021-11-14 ENCOUNTER — Encounter: Payer: Self-pay | Admitting: Dermatology

## 2021-11-23 ENCOUNTER — Ambulatory Visit: Payer: 59 | Admitting: Dermatology

## 2021-12-12 ENCOUNTER — Other Ambulatory Visit: Payer: Self-pay

## 2021-12-12 ENCOUNTER — Ambulatory Visit: Payer: 59 | Attending: Internal Medicine | Admitting: Internal Medicine

## 2021-12-12 VITALS — BP 100/70 | HR 68 | Ht 64.0 in | Wt 202.0 lb

## 2021-12-12 DIAGNOSIS — Z124 Encounter for screening for malignant neoplasm of cervix: Secondary | ICD-10-CM | POA: Insufficient documentation

## 2021-12-12 DIAGNOSIS — F32A Depression, unspecified: Secondary | ICD-10-CM | POA: Insufficient documentation

## 2021-12-12 DIAGNOSIS — L659 Nonscarring hair loss, unspecified: Secondary | ICD-10-CM | POA: Insufficient documentation

## 2021-12-12 DIAGNOSIS — N898 Other specified noninflammatory disorders of vagina: Secondary | ICD-10-CM | POA: Insufficient documentation

## 2021-12-12 NOTE — Progress Notes (Signed)
[facility] NOTE    Past Medical History: has Chronic midline low back pain without sciatica; Class 1 obesity; and Pre-diabetes on their problem list.      Medications:   Current Outpatient Medications   Medication Sig    buPROPion (WELLBUTRIN XL) 300 mg 24 hr tablet Take 1 tablet (300 mg total) by mouth every morning  Swallow whole. Do not crush, break, or chew.    ketoconazole (NIZORAL) 2 % shampoo Apply topically once a week  for Tinea Versicolor to the following areas: scalp    betamethasone dipropionate (DIPROSONE) 0.05 % cream Apply topically once a week  as a thin film to scalp where there is hair loss (Patient not taking: Reported on 12/12/2021)    gabapentin (NEURONTIN) 300 mg capsule Take 1 capsule (300 mg total) by mouth 3 times daily as needed    phentermine-topiramate (QSYMIA) 7.5-46 MG per capsule Take 1 capsule by mouth every morning (Patient not taking: Reported on 12/12/2021)    meloxicam (MOBIC) 7.5 mg tablet Take 1 tablet (7.5 mg total) by mouth daily     No current facility-administered medications for this visit.        Allergies:   Allergies   Allergen Reactions    Shellfish-Derived Products Anaphylaxis    Cinnamon Itching       HPI/ROS:     Theresa Bullock is a 41 yo F class 1 obesity presenting for routine follow-up, last seen on 10/13/21 for alopecia and for mild MDD, here for cervical cancer screening.    Theresa Bullock's last Pap smear was in 2017.  While it was benign, she had an abnormal Pap smear several years ago.  She has not had any vaginal discharge or pain.  She has noticed a little pruritis and wonders if she has a yeast infection.  She has not had any dyspareunia.  Her period has been regular, LMP 11/14/2021.  Not  Interested in STI screening today.    OBJECTIVE:      Vitals:    12/12/21 1141 12/12/21 1143   BP:  100/70   BP Location:  Right arm   Patient Position:  Sitting   Cuff Size:  large adult   Pulse:  68   Weight: 91.6 kg (202 lb) 91.6 kg (202 lb)   Height: 1.626 m (5\' 4" ) 1.626 m  (5\' 4" )     PHYSICAL EXAM:  Constitutional: Pleasant, comfortable, NAD  Gyn:   - Normal external genitalia exam without rash or erythema.  Trace physiologic white discharge externally  - Cervical os visualized, trace physiologic white discharge.  No erythema or lesions visualized.    No results found for this or any previous visit (from the past 24 hour(s)).      ASSESSMENT/PLAN:   Theresa Bullock is a 41 yo F class 1 obesity presenting for routine follow-up, last seen on 10/13/21 for alopecia and for mild MDD, here for cervical cancer screening.    Cervical cancer screening  -     GYN Cytology    Vaginal itching  -     Vaginitis screen: DNA probe    Chronic Medical Conditions  Alopecia: Currently being seen by dermatology.  Now on ketoconazole shampoo, no change with betamethasone dipropionate 0.05% once weekly   Depression, mild: Continue buPROPion (WELLBUTRIN XL) 300 mg daily  Class 1 obesity, preDM, HLD: LDL 168, HgA1c 6.0, normal TSH (08/15/21)    Health Maintenance:  Cancer Screening  - Benign Pap, no HR-HPV (12/21/15); Pap with HPV co-typing  today  - Normal mammogram (09/08/21)  Vaccinations  - Covid-19 bivalent on 10/31/21  - Seasonal influenza 08/15/21  - Tdap 05/17/17    Follow up: 6 mo, earlier prn    ______________________  Glade Stanford MD

## 2021-12-13 LAB — VAGINITIS SCREEN: DNA PROBE: Vaginitis Screen:DNA Probe: 0

## 2021-12-16 ENCOUNTER — Other Ambulatory Visit: Payer: Self-pay

## 2021-12-16 ENCOUNTER — Ambulatory Visit: Payer: 59 | Admitting: Dermatology

## 2021-12-16 DIAGNOSIS — L910 Hypertrophic scar: Secondary | ICD-10-CM

## 2021-12-16 MED ORDER — TRIAMCINOLONE ACETONIDE 40 MG/ML IJ SUSP *I*
16.0000 mg | Freq: Once | INTRAMUSCULAR | Status: AC
Start: 2021-12-16 — End: 2021-12-16
  Administered 2021-12-16: 16 mg via INTRADERMAL
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
## 2021-12-19 LAB — GYN CYTOLOGY

## 2021-12-20 ENCOUNTER — Encounter: Payer: Self-pay | Admitting: Dermatology

## 2022-01-17 ENCOUNTER — Encounter: Payer: Self-pay | Admitting: Dermatology

## 2022-01-17 ENCOUNTER — Other Ambulatory Visit: Payer: Self-pay

## 2022-01-17 ENCOUNTER — Ambulatory Visit: Payer: 59 | Admitting: Dermatology

## 2022-01-17 VITALS — Temp 96.8°F | Wt 202.0 lb

## 2022-01-17 DIAGNOSIS — L910 Hypertrophic scar: Secondary | ICD-10-CM

## 2022-01-17 MED ORDER — TRIAMCINOLONE ACETONIDE 40 MG/ML IJ SUSP *I*
40.0000 mg | Freq: Once | INTRAMUSCULAR | Status: AC
Start: 2022-01-17 — End: 2022-01-17
  Administered 2022-01-17: 40 mg via INTRADERMAL
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
## 2022-01-17 NOTE — Addendum Note (Signed)
Addended by: Almeta Monas E on: 01/17/2022 10:16 AM     Modules accepted: Orders

## 2022-01-18 ENCOUNTER — Encounter: Payer: Self-pay | Admitting: Internal Medicine

## 2022-01-18 DIAGNOSIS — F32A Depression, unspecified: Secondary | ICD-10-CM

## 2022-01-18 MED ORDER — BUPROPION HCL 150 MG PO TB24 *I*
150.0000 mg | ORAL_TABLET | Freq: Every morning | ORAL | 0 refills | Status: DC
Start: 2022-01-18 — End: 2022-03-16

## 2022-01-31 ENCOUNTER — Encounter: Payer: Self-pay | Admitting: Internal Medicine

## 2022-01-31 DIAGNOSIS — F32A Depression, unspecified: Secondary | ICD-10-CM

## 2022-01-31 NOTE — Telephone Encounter (Signed)
Patient was last seen 12/12/21 per your know the patient was taking 300 mg tablets

## 2022-02-03 ENCOUNTER — Encounter: Payer: Self-pay | Admitting: Internal Medicine

## 2022-02-06 ENCOUNTER — Encounter: Payer: Self-pay | Admitting: Internal Medicine

## 2022-02-06 DIAGNOSIS — H1013 Acute atopic conjunctivitis, bilateral: Secondary | ICD-10-CM

## 2022-02-07 MED ORDER — OLOPATADINE HCL 0.2 % OP SOLN *A*
1.0000 [drp] | Freq: Every day | OPHTHALMIC | 0 refills | Status: DC
Start: 2022-02-07 — End: 2024-05-07

## 2022-02-14 ENCOUNTER — Other Ambulatory Visit: Payer: Self-pay

## 2022-02-14 ENCOUNTER — Ambulatory Visit: Payer: 59 | Admitting: Dermatology

## 2022-02-14 DIAGNOSIS — L659 Nonscarring hair loss, unspecified: Secondary | ICD-10-CM

## 2022-02-14 DIAGNOSIS — L219 Seborrheic dermatitis, unspecified: Secondary | ICD-10-CM

## 2022-02-14 NOTE — Patient Instructions (Addendum)
-  START topical minoxidil (rogaine) 5% (men's strength) liquid or foam twice daily to scalp (google "[facility] rogaine" for instructional video)    - discussed side effects of topical minoxidil of which include, but are not limited to burning, stinging, or redness at the application site;  If any of these effects persist or worsen, discontinue and contact your doctor  - Although benefits may be seen sooner treatment should be continued for at least 12 months before an assessment of efficacy.  If these treatments are discontinued, the extent of loss will rapidly progress to where it would have been had treatment not been initiated.
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
## 2022-03-06 ENCOUNTER — Other Ambulatory Visit: Payer: Self-pay

## 2022-03-06 ENCOUNTER — Ambulatory Visit: Payer: 59 | Admitting: Dermatology
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
## 2022-03-08 ENCOUNTER — Other Ambulatory Visit: Payer: Self-pay

## 2022-03-08 ENCOUNTER — Ambulatory Visit: Payer: 59 | Admitting: Dermatology

## 2022-03-08 VITALS — Ht 64.0 in | Wt 202.0 lb

## 2022-03-08 DIAGNOSIS — L910 Hypertrophic scar: Secondary | ICD-10-CM

## 2022-03-08 MED ORDER — TRIAMCINOLONE ACETONIDE 40 MG/ML IJ SUSP *I*
40.0000 mg | Freq: Once | INTRAMUSCULAR | Status: AC
Start: 2022-03-08 — End: 2022-03-08
  Administered 2022-03-08: 40 mg via INTRADERMAL
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
## 2022-03-16 ENCOUNTER — Telehealth: Payer: Self-pay

## 2022-03-16 ENCOUNTER — Telehealth: Payer: Self-pay | Admitting: Internal Medicine

## 2022-03-16 ENCOUNTER — Encounter: Payer: Self-pay | Admitting: Internal Medicine

## 2022-03-16 ENCOUNTER — Ambulatory Visit: Payer: 59 | Admitting: Internal Medicine

## 2022-03-16 DIAGNOSIS — R7303 Prediabetes: Secondary | ICD-10-CM

## 2022-03-16 DIAGNOSIS — E669 Obesity, unspecified: Secondary | ICD-10-CM

## 2022-03-16 DIAGNOSIS — E785 Hyperlipidemia, unspecified: Secondary | ICD-10-CM

## 2022-03-16 DIAGNOSIS — E538 Deficiency of other specified B group vitamins: Secondary | ICD-10-CM

## 2022-03-16 DIAGNOSIS — L659 Nonscarring hair loss, unspecified: Secondary | ICD-10-CM

## 2022-03-16 NOTE — Progress Notes (Incomplete)
[facility] NOTE    Past Medical History: has Chronic midline low back pain without sciatica; Class 1 obesity; and Pre-diabetes on their problem list.      Medications:   Current Outpatient Medications   Medication Sig   . olopatadine (PATADAY) 0.2 % ophthalmic solution Place 1 drop into both eyes daily   . buPROPion (WELLBUTRIN XL) 150 mg 24 hr tablet Take 1 tablet (150 mg total) by mouth every morning for 14 days  Swallow whole. Do not crush, break, or chew.   . ketoconazole (NIZORAL) 2 % shampoo Apply topically once a week  for Tinea Versicolor to the following areas: scalp   . gabapentin (NEURONTIN) 300 mg capsule Take 1 capsule (300 mg total) by mouth 3 times daily as needed   . meloxicam (MOBIC) 7.5 mg tablet Take 1 tablet (7.5 mg total) by mouth daily     No current facility-administered medications for this visit.        Allergies:   Allergies   Allergen Reactions   . Shellfish-Derived Products Anaphylaxis   . Cinnamon Itching       HPI/ROS:     Theresa Bullock is a 41 yo F class 1 obesity presenting for routine follow-up, last seen on 12/12/21 for routine Pap smear.    ***    OBJECTIVE:      There were no vitals filed for this visit.  PHYSICAL EXAM:  Constitutional: Pleasant, comfortable, NAD  Gyn:   - Normal external genitalia exam without rash or erythema.  Trace physiologic white discharge externally  - Cervical os visualized, trace physiologic white discharge.  No erythema or lesions visualized.    No results found for this or any previous visit (from the past 24 hour(s)).      ASSESSMENT/PLAN:   Theresa Bullock is a 41 yo F class 1 obesity presenting for routine follow-up, last seen on 10/13/21 for alopecia and for mild MDD, here for cervical cancer screening.    Cervical cancer screening  -     GYN Cytology    Vaginal itching  -     Vaginitis screen: DNA probe    Chronic Medical Conditions  Alopecia: Currently being seen by dermatology.  Now on ketoconazole shampoo, no change with betamethasone dipropionate  0.05% once weekly   Depression, mild: Continue buPROPion (WELLBUTRIN XL) 300 mg daily  Class 1 obesity, preDM, HLD: LDL 168, HgA1c 6.0, normal TSH (08/15/21)    Health Maintenance:  Cancer Screening  - Benign Pap, no HR-HPV (12/21/15); Pap with HPV co-typing today  - Normal mammogram (09/08/21)  Vaccinations  - Covid-19 bivalent on 10/31/21  - Seasonal influenza 08/15/21  - Tdap 05/17/17    Follow up: 6 mo, earlier prn    ______________________  Glade Stanford MD

## 2022-03-16 NOTE — Telephone Encounter (Signed)
Returned Tree surgeon. She was irate that she missed her appointment due to construction and could not locate the entrance to our office. I called her back and apologized for any construction delays, but did inform her that [address] is open and the entrance to the hospital is not blocked.  She was very argumentative that we did not provide her direction, and that our call was being recorded.  She was unhappy with her call to [facility] earlier, stating that she shouldn't have to wait so long for a new appointment.  She brought up the construction workers, who were not happy, and individuals in a parking lot. I was unclear what she was talking about, and patient was aggressive. I continued to apologize but explain that our office is open to [facility], the address she had was correct, and that I can have someone contact her to schedule her appointment but that it wouldn't be sooner than the visit offered prior.  She continued to speak over me and argue, stating she was posting videos to social media of her experience.  I stated I would have someone contact her to reschedule her visit and I disconnected the call.

## 2022-03-16 NOTE — Telephone Encounter (Signed)
left voicemail for patient about rescheduling visit for physical with Dr.Arden that was missed 6/22. Open slot blocked 6/29 for patient if she elects to call back onn 6/22. After 6/22 the slots will be unvlocked and open for patients

## 2022-03-16 NOTE — Telephone Encounter (Signed)
Caller called to inform that she was unable to get into the entrance of the hospital due to construction. Let caller know that if she comes up [facility] side, that entrance is now free of Holiday representative. Caller stated she did not know her way around here. Caller was informed that unfortunately she would need to reschedule because at the time of her call it was past the appointment grace period time. Caller was very upset about this and asked to speak to the Emergency planning/management officer. Writer forward call to Emergency planning/management officer, received voicemail. Caller then called back asking what she is to do next. Writer stated she would need to reschedule at his point. Caller offered the next available opening for a physical. Caller did not like that she had to wait until said date to come in. Caller then was being very rude to Retail banker. No appointment was made.

## 2022-03-20 ENCOUNTER — Telehealth: Payer: 59 | Admitting: Internal Medicine

## 2022-03-20 ENCOUNTER — Other Ambulatory Visit
Admission: RE | Admit: 2022-03-20 | Discharge: 2022-03-20 | Disposition: A | Discharge status: Home / Self Care | Payer: 59 | Source: Ambulatory Visit | Attending: Internal Medicine | Admitting: Internal Medicine

## 2022-03-20 DIAGNOSIS — E669 Obesity, unspecified: Secondary | ICD-10-CM | POA: Insufficient documentation

## 2022-03-20 DIAGNOSIS — E785 Hyperlipidemia, unspecified: Secondary | ICD-10-CM | POA: Insufficient documentation

## 2022-03-20 DIAGNOSIS — L659 Nonscarring hair loss, unspecified: Secondary | ICD-10-CM | POA: Insufficient documentation

## 2022-03-20 DIAGNOSIS — R7303 Prediabetes: Secondary | ICD-10-CM | POA: Insufficient documentation

## 2022-03-20 DIAGNOSIS — Z6835 Body mass index (BMI) 35.0-35.9, adult: Secondary | ICD-10-CM

## 2022-03-20 LAB — COMPREHENSIVE METABOLIC PANEL
ALT: 25 U/L (ref 0–35)
AST: 22 U/L (ref 0–35)
Albumin: 4.2 g/dL (ref 3.5–5.2)
Alk Phos: 47 U/L (ref 35–105)
Anion Gap: 12 (ref 7–16)
Bilirubin,Total: 0.4 mg/dL (ref 0.0–1.2)
CO2: 23 mmol/L (ref 20–28)
Calcium: 9.5 mg/dL (ref 8.8–10.2)
Chloride: 106 mmol/L (ref 96–108)
Creatinine: 0.74 mg/dL (ref 0.51–0.95)
Glucose: 84 mg/dL (ref 60–99)
Lab: 15 mg/dL (ref 6–20)
Potassium: 4.4 mmol/L (ref 3.3–5.1)
Sodium: 141 mmol/L (ref 133–145)
Total Protein: 6.5 g/dL (ref 6.3–7.7)
eGFR BY CREAT: 103 *

## 2022-03-20 LAB — LIPID PANEL
Chol/HDL Ratio: 3.2
Cholesterol: 227 mg/dL — AB
HDL: 71 mg/dL — ABNORMAL HIGH (ref 40–60)
LDL Calculated: 136 mg/dL — AB
Non HDL Cholesterol: 156 mg/dL
Triglycerides: 98 mg/dL

## 2022-03-20 LAB — TIBC
Iron: 83 ug/dL (ref 34–165)
TIBC: 342 ug/dL (ref 250–450)
Transferrin Saturation: 24 % (ref 15–50)

## 2022-03-20 LAB — TSH: TSH: 0.65 u[IU]/mL (ref 0.27–4.20)

## 2022-03-20 LAB — VITAMIN B12: Vitamin B12: 1068 pg/mL (ref 232–1245)

## 2022-03-20 LAB — FERRITIN: Ferritin: 34 ng/mL (ref 10–120)

## 2022-03-20 MED ORDER — SEMAGLUTIDE-WEIGHT MANAGEMENT 0.25 MG/0.5ML SC SOAJ *I*
0.2500 mg | SUBCUTANEOUS | 0 refills | Status: AC
Start: 2022-03-20 — End: 2022-04-17

## 2022-03-20 NOTE — Progress Notes (Signed)
Telephone Visit     This is an established patient visit.      Mode of Communication: Audio Only: Reason - Patient prefers Audio Only   The service provided today can be effectively delivered without a visual or in-person component and includes all clinically appropriate documentation requirements as if provided in-person. Audio-visual will continue to be an option for this patient as well as in-person visits.    Location of Patient: Home    Location of Telemedicine Provider: Clinic    Reason for visit: Follow-up    HPI:  Theresa Bullock is a 41 yo F with class 2 obesity c/b pre-diabetes presenting for telemedicine evaluation, expressing interest in weight loss medication.    Since approximately March, Theresa Bullock has noticed an increase in hunger and thirst.  She has gained weight.  She has been urinating more often, also reporting both nocturia.  She has been taking care of the kids, going to school for anesthesia training.    Currently Theresa Bullock weighs 206 lbs, BMI 35.4    Theresa Bullock has previously tried exercise (walking 30 min day, 3 hrs week), had liposuction (no gastric bypass or gastric sleeve).  She has previously seen a dietician, most recently saw in January.  She was previously on Qysmia  She was then on bupropion from Nov-2022 to Apr-2023 for concurrent mild depressed mood, stopped due to lack of improvement in mood and concern for hair loss.    Theresa Bullock does not have a personal history of pancreatitis or family thyroid cancer history     Patient's problem list, allergies, and medications were reviewed and updated as appropriate. Please see the EHR for full details.    Physical Exam (Telephone):  Constitutional: Pleasant.  Coherent speech.  Pulm: Normal WOB    Assessment Plan:  Theresa Bullock is a 41 yo F with class 2 obesity c/b pre-diabetes presenting for telemedicine evaluation, expressing interest in weight loss medication.    After Visit Addendum: Initially prescribed Wegovy at time of visit.  After insurance  refused the medication, initially tried to prescribe Saxenda and then Ozempic (given concurrent pre-diabetes).    Class 2 severe obesity due to excess calories with serious comorbidity and body mass index (BMI) of 35.0 to 35.9 in adult  -     Begin semaglutide for weight management (WEGOVY) 0.25 MG/0.5ML auto-injector; Inject 0.5 mLs (0.25 mg total) into the skin every 7 days for 28 days (Patient not taking: Reported on 04/08/2022)  -     After-Visit Addendum: Begin liraglutide (SAXENDA) 18 MG/3ML pen; Inject 0.1 mLs (0.6 mg total) into the skin daily for 7 days, THEN 0.2 mLs (1.2 mg total) daily for 7 days, THEN 0.3 mLs (1.8 mg total) daily for 7 days, THEN 0.4 mLs (2.4 mg total) daily for 7 days. THEN 0.5 mLs (3.0 mg total) daily thereafter. (Patient not taking: Reported on 04/08/2022)  - CMP, HgA1c, TSH, lipid panel    Pre-diabetes  -     After-Visit Addendum: Begin semaglutide 0.25 mg or 0.5 mg dose (OZEMPIC) pen; Inject 0.38 mLs (0.25 mg total) into the skin once a week for 28 days, THEN 0.75 mLs (0.5 mg total) once a week.    The plan was discussed with the patient and the patient/patient rep demonstrated understanding to the provider's satisfaction.    Consent was previously obtained from the patient to complete this telephone consult; including the potential for financial liability.    21+ minutes were spent on the phone with the patient,  patient representatives, and/or other attendees.     Theresa Bullock. Fransico Setters, MD

## 2022-03-21 LAB — HEMOGLOBIN A1C: Hemoglobin A1C: 6 % — ABNORMAL HIGH

## 2022-03-23 MED ORDER — LIRAGLUTIDE -WEIGHT MANAGEMENT 18 MG/3ML SC SOPN *A*
PEN_INJECTOR | SUBCUTANEOUS | 0 refills | Status: DC
Start: 2022-03-23 — End: 2022-06-19

## 2022-04-08 MED ORDER — SEMAGLUTIDE (0.25 OR 0.5MG/DOSE) 2 MG/3ML SC SOPN *I*
PEN_INJECTOR | SUBCUTANEOUS | 0 refills | Status: AC
Start: 2022-04-08 — End: 2022-06-07

## 2022-04-14 ENCOUNTER — Telehealth: Payer: Self-pay | Admitting: Internal Medicine

## 2022-04-14 NOTE — Telephone Encounter (Signed)
PT returned call. Pt gave the appropriate address.    PA for Ozempic submitted. Awaiting determination.

## 2022-04-14 NOTE — Telephone Encounter (Signed)
Writer attempted to start PA for pt Ozempi 3mg /64mL pens.    Unable to submit. Pt phone # or address not found in CoverMyMeds.    Writer left VM with pt to clarify address/number.  Asked for call back.

## 2022-04-18 ENCOUNTER — Other Ambulatory Visit: Payer: Self-pay

## 2022-04-18 ENCOUNTER — Ambulatory Visit: Payer: 59 | Admitting: Dermatology

## 2022-04-18 VITALS — Ht 64.0 in | Wt 200.0 lb

## 2022-04-18 DIAGNOSIS — L910 Hypertrophic scar: Secondary | ICD-10-CM

## 2022-04-18 MED ORDER — TRIAMCINOLONE ACETONIDE 40 MG/ML IJ SUSP *I*
40.0000 mg | Freq: Once | INTRAMUSCULAR | Status: AC
Start: 2022-04-18 — End: 2022-04-18
  Administered 2022-04-18: 40 mg via INTRADERMAL
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
## 2022-04-25 NOTE — Telephone Encounter (Signed)
PA for pt Ozempic has been denied.

## 2022-06-01 ENCOUNTER — Ambulatory Visit: Payer: 59 | Admitting: Family

## 2022-06-07 ENCOUNTER — Ambulatory Visit: Payer: 59

## 2022-06-19 ENCOUNTER — Encounter: Payer: Self-pay | Admitting: Family

## 2022-06-19 ENCOUNTER — Ambulatory Visit: Payer: 59 | Admitting: Internal Medicine

## 2022-06-19 ENCOUNTER — Ambulatory Visit: Payer: 59 | Attending: Family | Admitting: Family

## 2022-06-19 ENCOUNTER — Other Ambulatory Visit: Payer: Self-pay

## 2022-06-19 VITALS — BP 124/83 | HR 99 | Temp 98.1°F | Resp 18

## 2022-06-19 DIAGNOSIS — J029 Acute pharyngitis, unspecified: Secondary | ICD-10-CM | POA: Insufficient documentation

## 2022-06-19 LAB — POCT AMBULATORY RAPID STREP
Lot #: 231198
Rapid Strep Group A Throat-POC: NEGATIVE

## 2022-06-19 NOTE — Patient Instructions (Signed)
Warm Salt water gargles can help alleviate throat discomfort  Cold drinks and food such as popsicles, ice chips, ice cream, and milkshakes can help soothe throat and reduce inflammation  Please continue to hydrate aggressively and get plenty of rest    Can try throat lozenges or sprays for comfort such as Cepacol or Chloraspetic spray  Ibuprofen 400-600mg can be taken every 6 hours for the next 3 days. Take with food to avoid stomach upset    Your rapid strep test was negative; we will send your throat culture to the lab for a more sensitive test. If this is positive you will hear from us and order an antibiotic for you.     If you develop any worsening sore throat that is not resolving, shortness of breath, chest pain, worsening fevers, difficulty swallowing, or unable to tolerate your own secretions please go to the Emergency Department to be evaluated.

## 2022-06-19 NOTE — UC Provider Note (Signed)
UC Provider Note for Sore Throat    Chief Complaint:   Chief Complaint   Patient presents with    URI     Pt presents with one day hx of sore throat, runny nose, stuffy nose. No fever. Pt is concerned for strep throat, did not have any exposures.        History provided by: patient    History limited by: n/a    Language interpreter used: no    41 y.o. female presents to the UC for a sore throat for the past 1 days.     Location: bilateral        Symptoms have remained the same     Known Strep throat contacts: No   Known COVID 19 exposures: No   COVID 19 vaccination completed >2 weeks ago: Yes     Review of Systems and Additional Symptoms Endorsed:    Fever:  No   Chills:  No   Swollen Lymph Nodes: No   Trouble swallowing: Yes   Neck pain: No   Ear Pain: No   Nasal Congestion: Yes   Cough: No   Abdominal Pain: No     BP 124/83   Pulse 99   Temp 36.7 C (98.1 F) (Temporal)   Resp 18   SpO2 100%     Physical Exam:   Appearance: Well appearing, no acute distress   Eyes: no conjunctival injection or drainage   Ears: Normal TMs and canals bilaterally   Nose: there is congestion, frontal and maxillary sinuses are not tender to palpation   Mouth/Throat: there is erythema of oropharynx, there is not swelling, there is not petechiae, there is not exudate, no PTA, no drooling, tonsils normal size, 2+ bilaterally   Neck: there is not cervical LAD, there is not tenderness, full AROM   CV: RRR, no murmur   Pulm: no acute respiratory distress, Lungs clear to auscultation bilaterally   Abd: NABS, soft, non-distended, NTTP, no hepatosplenomegaly   Skin: no pallor or rash     MDM:  Assessment:   41 y.o. female presents with sore throat as described above. She is well appearing  and has no signs of airway compromise, deep space infection or abscess.     Differential Diagnosis:  Viral Pharyngitis  Strep Pharyngitis  Post Nasal Drip  Viral URI  Nasopharyngitis   Tonsillitis    Orders Placed This Encounter   Procedures    Strep A  culture, throat    POCT ambulatory rapid strep       Plan:  Rapid test ordered and negative. Throat culture sent. Will treat as viral pharyngitis with supportive care measures and OTC medications as discussed      Vinnie Langton, NP

## 2022-06-20 LAB — STREP A CULTURE, THROAT: Group A Strep Throat Culture: 0

## 2022-06-30 ENCOUNTER — Ambulatory Visit: Payer: 59 | Admitting: Family

## 2022-07-10 ENCOUNTER — Other Ambulatory Visit: Payer: Self-pay

## 2022-07-10 ENCOUNTER — Telehealth: Payer: Self-pay

## 2022-07-10 ENCOUNTER — Ambulatory Visit: Payer: 59 | Admitting: Family

## 2022-07-10 VITALS — Ht 64.8 in | Wt 200.0 lb

## 2022-07-10 DIAGNOSIS — L910 Hypertrophic scar: Secondary | ICD-10-CM

## 2022-07-10 DIAGNOSIS — R21 Rash and other nonspecific skin eruption: Secondary | ICD-10-CM

## 2022-07-10 DIAGNOSIS — L408 Other psoriasis: Secondary | ICD-10-CM

## 2022-07-10 MED ORDER — FLUOCINOLONE ACETONIDE SCALP 0.01 % EX OIL *I*
TOPICAL_OIL | Freq: Every evening | CUTANEOUS | 2 refills | Status: AC | PRN
Start: 2022-07-10 — End: ?

## 2022-07-10 MED ORDER — KETOCONAZOLE 2 % EX SHAM *I*
MEDICATED_SHAMPOO | CUTANEOUS | 3 refills | Status: DC
Start: 2022-07-10 — End: 2024-02-05

## 2022-07-10 MED ORDER — TRIAMCINOLONE ACETONIDE 40 MG/ML IJ SUSP *I*
40.0000 mg | Freq: Once | INTRAMUSCULAR | Status: AC
Start: 2022-07-10 — End: 2022-07-10
  Administered 2022-07-10: 40 mg via INTRADERMAL

## 2022-07-10 NOTE — Telephone Encounter (Signed)
Return to clinic: 6-8 weeks for repeat treatment     (Pt didn't stop at checkout)

## 2022-07-10 NOTE — Patient Instructions (Signed)
Royal Oils by [employer] Shoulders Sulfate Free Shampoo
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
## 2022-07-13 ENCOUNTER — Encounter: Payer: Self-pay | Admitting: Internal Medicine

## 2022-07-13 ENCOUNTER — Encounter: Payer: Self-pay | Admitting: Family

## 2022-07-13 NOTE — Telephone Encounter (Signed)
Messaged on mychart.

## 2022-07-14 ENCOUNTER — Ambulatory Visit: Payer: 59 | Admitting: Nurse Practitioner

## 2022-07-14 ENCOUNTER — Other Ambulatory Visit: Payer: Self-pay

## 2022-07-14 VITALS — BP 118/64 | HR 76 | Ht 64.8 in | Wt 210.0 lb

## 2022-07-14 DIAGNOSIS — R109 Unspecified abdominal pain: Secondary | ICD-10-CM

## 2022-07-14 NOTE — Progress Notes (Signed)
[facility] NOTE    Past Medical History: has Chronic midline low back pain without sciatica; Class 1 obesity; and Pre-diabetes on their problem list.      Medications:   Current Outpatient Medications   Medication Sig    ketoconazole (NIZORAL) 2 % shampoo Apply topically once a week  to the following areas: scalp    olopatadine (PATADAY) 0.2 % ophthalmic solution Place 1 drop into both eyes daily    meloxicam (MOBIC) 7.5 mg tablet Take 1 tablet (7.5 mg total) by mouth daily    fluocinolone 0.01% scalp (DERMA-SMOOTHE/FS) 0.01 % OIL external oil Apply topically nightly as needed  to the following areas: scaklp    gabapentin (NEURONTIN) 300 mg capsule Take 1 capsule (300 mg total) by mouth 3 times daily as needed     No current facility-administered medications for this visit.        Allergies:   Allergies   Allergen Reactions    Shellfish-Derived Products Anaphylaxis    Cinnamon Itching       HPI/ROS:     Pt is being seen for Dr. Fransico Setters for an acute visit  -Pt states she has had abdominal cramping/spasms when doing sit ups or sitting up from a lying position since 2020. She states it feels like her abdominal muscles are twisting around each other when she sits up. Wants an ultrasound to determine if she has a herniation. Has been getting worse but not acutely. States getting worse since 2020. She feels paralyzed at the gym until it passes when doing situps. She feels the abdomen protrudes when sitting up. Has pain with heavy lifting and bending.   -No N/V. BM normal, no constipation or diarrhea. No effect with eating. Has epigastric pain in the morning. Takes pepcid or omeprazole which isn't working like it used to.   -had C-section in 2018 and liposuction in 2020 on her abdomen in the same area which did not go well. Has seen the surgeon since but there is a case against him. She has never had this pain evaluated. Says she finally decided it was a problem she should look into  OBJECTIVE:      Vitals:    07/14/22 1012    BP: 118/64   BP Location: Right arm   Patient Position: Sitting   Cuff Size: large adult   Pulse: 76   Weight: 95.3 kg (210 lb)   Height: 1.646 m (5' 4.8")         PHYSICAL EXAM:  GEN: here alone, well groomed, appears comfortable  CVS: RRR  Pulm: CTAB  GI: +BS, NT. Multiple large hyperpigmented scars on abdomen. There is a large scar near the epigastrum, where she c/o the pain and several across the lower abdomen. There is no abnormal protrusion of muscle or tissue when sitting up from lying position. No umbilical hernia noted.     No results found for this or any previous visit (from the past 24 hour(s)).      ASSESSMENT/PLAN:     1. Abdominal pain, unspecified abdominal location: since 2020, possibly associated with liposuction which caused several significant scars. There may be underlying adhesions causing the pain sensation. Do not feel lab work is necessary given the duration.    - US abdominal complete; Future      Follow up: 1 month     ______________________

## 2022-07-18 ENCOUNTER — Telehealth: Payer: Self-pay | Admitting: Plastic Surgery

## 2022-07-18 NOTE — Telephone Encounter (Signed)
Faxed requested clinical note to [facility] as requested through [employer].

## 2022-07-18 NOTE — Telephone Encounter (Signed)
Patient scheduled.

## 2022-08-05 ENCOUNTER — Encounter: Payer: Self-pay | Admitting: Family

## 2022-08-07 ENCOUNTER — Other Ambulatory Visit: Payer: Self-pay | Admitting: Family

## 2022-08-07 ENCOUNTER — Encounter: Payer: Self-pay | Admitting: Internal Medicine

## 2022-08-07 MED ORDER — MUPIROCIN 2 % EX OINT *I*
TOPICAL_OINTMENT | Freq: Three times a day (TID) | CUTANEOUS | 0 refills | Status: DC
Start: 2022-08-07 — End: 2023-01-14

## 2022-08-07 NOTE — Telephone Encounter (Signed)
This patient attachment is clinically relevant.  Please keep in the patient's chart.    []  Document  [x]  Photo    Brief attachment description: Keloid      Thank you,  Freddi Starr

## 2022-08-10 ENCOUNTER — Other Ambulatory Visit: Payer: Self-pay

## 2022-08-10 ENCOUNTER — Encounter: Payer: Self-pay | Admitting: Family

## 2022-08-10 ENCOUNTER — Ambulatory Visit: Payer: 59 | Attending: Family | Admitting: Family

## 2022-08-10 VITALS — Ht 64.0 in | Wt 210.0 lb

## 2022-08-10 DIAGNOSIS — L98491 Non-pressure chronic ulcer of skin of other sites limited to breakdown of skin: Secondary | ICD-10-CM | POA: Insufficient documentation
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
## 2022-08-11 ENCOUNTER — Other Ambulatory Visit: Payer: Self-pay | Admitting: Nurse Practitioner

## 2022-08-11 ENCOUNTER — Ambulatory Visit
Admission: RE | Admit: 2022-08-11 | Discharge: 2022-08-11 | Disposition: A | Discharge status: Home / Self Care | Payer: 59 | Source: Ambulatory Visit | Attending: Nurse Practitioner | Admitting: Nurse Practitioner

## 2022-08-11 DIAGNOSIS — R109 Unspecified abdominal pain: Secondary | ICD-10-CM

## 2022-08-11 DIAGNOSIS — K8080 Other cholelithiasis without obstruction: Secondary | ICD-10-CM

## 2022-08-11 LAB — GRAM STAIN: Gram Stain: 0

## 2022-08-12 LAB — AEROBIC CULTURE: Aerobic Culture: 0

## 2022-08-14 ENCOUNTER — Telehealth: Payer: Self-pay

## 2022-08-14 NOTE — Telephone Encounter (Signed)
Called and left message. 6 week fuv

## 2022-08-16 ENCOUNTER — Ambulatory Visit: Payer: 59 | Attending: Internal Medicine | Admitting: Internal Medicine

## 2022-08-16 ENCOUNTER — Other Ambulatory Visit: Payer: Self-pay

## 2022-08-16 ENCOUNTER — Telehealth: Payer: Self-pay | Admitting: Internal Medicine

## 2022-08-16 VITALS — BP 140/110 | HR 85 | Ht 60.0 in | Wt 216.0 lb

## 2022-08-16 DIAGNOSIS — J189 Pneumonia, unspecified organism: Secondary | ICD-10-CM

## 2022-08-16 DIAGNOSIS — Z20822 Contact with and (suspected) exposure to covid-19: Secondary | ICD-10-CM | POA: Insufficient documentation

## 2022-08-16 DIAGNOSIS — J029 Acute pharyngitis, unspecified: Secondary | ICD-10-CM | POA: Insufficient documentation

## 2022-08-16 DIAGNOSIS — R059 Cough, unspecified: Secondary | ICD-10-CM | POA: Insufficient documentation

## 2022-08-16 DIAGNOSIS — Z20828 Contact with and (suspected) exposure to other viral communicable diseases: Secondary | ICD-10-CM | POA: Insufficient documentation

## 2022-08-16 DIAGNOSIS — Z1152 Encounter for screening for COVID-19: Secondary | ICD-10-CM | POA: Insufficient documentation

## 2022-08-16 MED ORDER — ALBUTEROL SULFATE HFA 108 (90 BASE) MCG/ACT IN AERS *I*
1.0000 | INHALATION_SPRAY | Freq: Four times a day (QID) | RESPIRATORY_TRACT | 5 refills | Status: DC | PRN
Start: 2022-08-16 — End: 2022-08-21

## 2022-08-16 MED ORDER — AMOXICILLIN-POT CLAVULANATE 875-125 MG PO TABS *I*
1.0000 | ORAL_TABLET | Freq: Two times a day (BID) | ORAL | 0 refills | Status: DC
Start: 2022-08-16 — End: 2022-08-21

## 2022-08-16 NOTE — Progress Notes (Signed)
[facility] NOTE    Past Medical History: has Chronic midline low back pain without sciatica; Class 1 obesity; and Pre-diabetes on their problem list.      Medications:   Current Outpatient Medications   Medication Sig    mupirocin (BACTROBAN) 2 % ointment Apply topically 3 times daily  for Wound Infection to the following areas: abdomen    fluocinolone 0.01% scalp (DERMA-SMOOTHE/FS) 0.01 % OIL external oil Apply topically nightly as needed  to the following areas: scaklp    ketoconazole (NIZORAL) 2 % shampoo Apply topically once a week  to the following areas: scalp    olopatadine (PATADAY) 0.2 % ophthalmic solution Place 1 drop into both eyes daily    gabapentin (NEURONTIN) 300 mg capsule Take 1 capsule (300 mg total) by mouth 3 times daily as needed    meloxicam (MOBIC) 7.5 mg tablet Take 1 tablet (7.5 mg total) by mouth daily     No current facility-administered medications for this visit.        Allergies:   Allergies   Allergen Reactions    Shellfish-Derived Products Anaphylaxis    Cinnamon Itching       HPI/ROS:     Theresa Bullock is a 41 yo F with PMH obesity presenting for URI symptoms.  - Last seen by me via telemedicine on 03/20/22 - attempted prescribe GLP-1 given obesity c/b prediabetes  - Last seen at [facility] on 07/14/22 with abdominal pain, concern for hernia based on prior surgical history.    Seen by Dr Chales Abrahams on 05/02/22 for vertigo.  She had begun taking her mother's Rybelsus to aid with weight loss.  She developed the dizziness after two weeks of Rybelsus.    Ilia first noticed the sore throat about two weeks ago    Inessa reports a productive cough with yellow-grey sputum about a week ago .  Has been taking Mucinex without relief.  Ingeborg began having dyspnea a week ago.  Weslie developed wheezing at that time as well.  Annalina reports DOE and before coughing.  Dyspnea briefly improved by coughing.  No orthopnea.    No longer has sinus congestion, just chest congestion.    No recent fever.    Lanissa  never had asthma as a child.  She tried her daughter's albuterol inhaler and it didn't help much, felt tachycardia.      Yuriana reports that two weeks ago.  She and her children had gastroenteritis.  She had a fever, vomiting, diarrhea.  The sore throat happened at the same time as the GI symptoms.  Her daughter has a similar cough.        OBJECTIVE:      Vitals:    08/16/22 1345   BP: (!) 140/110   BP Location: Left arm   Patient Position: Sitting   Cuff Size: adult   Pulse: 85   SpO2: 97%   Weight: 98 kg (216 lb)   Height: 1.524 m (5')       PHYSICAL EXAM:  Constitutional: Pleasant, comfortable, NAD  ENT: Oropharynx with trace cobblestoning, no erythema or exudate.  Normal otoscopic exam  Neck: No cervical or supraclavicular LAD  CV: Regular tachycardia.  No murmur.  No LE edema  Pulm: Normal WOB.  Diffuse wheezing throughout.  No crackles.  Abd: Soft, NT, ND, normoactive BS    No results found for this or any previous visit (from the past 24 hour(s)).      ASSESSMENT/PLAN:     Theresa Bullock  is a 41 yo F with PMH obesity presenting for URI symptoms.  Given double worsening and duration of symptoms, plan to treat for post-viral pneumonia with antibiotics.  May need asthma work-up in the future given frequent upper respiratory symptoms.    Cough, Sore throat  -     COVID/Influenza A & B/RSV NAAT (PCR)  -     Begin albuterol HFA 1-2 puffs every 6 hours as needed    Community acquired pneumonia  -     Begin amoxicillin-clavulanate 875-125 mg twice daily x 5 days    Class 3 obesity  -     AMB REFERRAL TO [facility]    Health Maintenance:  Cancer Screening  - Benign Pap, no HR-HPV (12/12/21)  - Normal mammogram (09/08/21)  Vaccinations  - Covid-19 bivalent on 10/31/21; discussed Updated Covid-19 vaccine  - Seasonal influenza 08/15/21; discussed influenza vaccine  - Tdap 05/17/17    Follow up: 3-4 mo, earlier prn  ______________________  Glade Stanford MD

## 2022-08-16 NOTE — Telephone Encounter (Signed)
Pt reports resp sx for over 2 weeks now.  Stated with a fever and chills which has resolved.  Now with a productive cough and sig mucous production. Mucinex not helping.  The last week she has felt wheezing and sob with her cough.  Worried this has progressed into bronchitis.  Offered appt today and she was scheduled with dr arden.

## 2022-08-17 LAB — UNMAPPED LAB RESULTS
Basophil # (HT): 0 10 3/uL (ref 0.0–0.2)
Basophil % (HT): 0 % (ref 0–2)
Eosinophil # (HT): 0.6 10 3/uL — ABNORMAL HIGH (ref 0.0–0.5)
Eosinophil % (HT): 3 % (ref 0–7)
Hematocrit (HT): 43 % (ref 40–52)
Hematocrit (HT): 42 % (ref 40–52)
Hemoglobin (HGB) (HT): 14.1 g/dL (ref 11.5–16.0)
Hemoglobin (HGB) (HT): 13.7 g/dL (ref 11.5–16.0)
Lymphocyte # (HT): 1.8 10 3/uL (ref 0.9–3.8)
Lymphocyte % (HT): 10 % — ABNORMAL LOW (ref 17–44)
MCHC (HT): 32.7 g/dL (ref 32.0–36.0)
MCHC (HT): 32.8 g/dL (ref 32.0–36.0)
MCV (HT): 87 fL (ref 81–99)
MCV (HT): 87 fL (ref 81–99)
Mean Corpuscular Hemoglobin (MCH) (HT): 28.4 pg (ref 26.0–34.0)
Mean Corpuscular Hemoglobin (MCH) (HT): 28.5 pg (ref 26.0–34.0)
Monocyte # (HT): 0.8 10 3/uL (ref 0.2–1.0)
Monocyte % (HT): 4 % (ref 4–15)
Neutrophil # (HT): 15.2 10 3/uL — ABNORMAL HIGH (ref 1.5–7.7)
Platelets (HT): 225 10 3/uL (ref 150–450)
Platelets (HT): 293 10 3/uL (ref 150–450)
RBC (HT): 4.97 10 6/uL (ref 3.80–5.20)
RBC (HT): 4.81 10 6/uL (ref 3.80–5.20)
RDW (HT): 13.7 % (ref 11.5–15.0)
RDW (HT): 13.6 % (ref 11.5–15.0)
Seg Neut % (HT): 82 % — ABNORMAL HIGH (ref 43–75)
WBC (HT): 18.5 10 3/uL — ABNORMAL HIGH (ref 4.0–10.0)
WBC (HT): 17.2 10 3/uL — ABNORMAL HIGH (ref 4.0–10.0)

## 2022-08-17 LAB — COVID/INFLUENZA A & B/RSV NAAT (PCR)
COVID-19 NAAT (PCR): NEGATIVE
Influenza A NAAT (PCR): NEGATIVE
Influenza B NAAT (PCR): NEGATIVE
RSV NAAT (PCR): NEGATIVE

## 2022-08-18 ENCOUNTER — Encounter: Payer: Self-pay | Admitting: Internal Medicine

## 2022-08-18 LAB — UNMAPPED LAB RESULTS
Basophil # (HT): 0 10 3/uL (ref 0.0–0.2)
Basophil % (HT): 0 % (ref 0–2)
Eosinophil # (HT): 0.1 10 3/uL (ref 0.0–0.5)
Eosinophil % (HT): 1 % (ref 0–7)
Hematocrit (HT): 35 % — ABNORMAL LOW (ref 40–52)
Hemoglobin (HGB) (HT): 11.8 g/dL (ref 11.5–16.0)
Lymphocyte # (HT): 1.5 10 3/uL (ref 0.9–3.8)
Lymphocyte % (HT): 8 % — ABNORMAL LOW (ref 17–44)
MCHC (HT): 34.2 g/dL (ref 32.0–36.0)
MCV (HT): 85 fL (ref 81–99)
Mean Corpuscular Hemoglobin (MCH) (HT): 28.9 pg (ref 26.0–34.0)
Monocyte # (HT): 0.4 10 3/uL (ref 0.2–1.0)
Monocyte % (HT): 2 % — ABNORMAL LOW (ref 4–15)
Neutrophil # (HT): 16.9 10 3/uL — ABNORMAL HIGH (ref 1.5–7.7)
Platelets (HT): 286 10 3/uL (ref 150–450)
RBC (HT): 4.08 10 6/uL (ref 3.80–5.20)
RDW (HT): 13.9 % (ref 11.5–15.0)
Seg Neut % (HT): 88 % — ABNORMAL HIGH (ref 43–75)
WBC (HT): 19.1 10 3/uL — ABNORMAL HIGH (ref 4.0–10.0)

## 2022-08-19 LAB — UNMAPPED LAB RESULTS
HIV 1&2 ANTIGEN/ANTIBODY (HT): NONREACTIVE
Hematocrit (HT): 36 % — ABNORMAL LOW (ref 40–52)
Hemoglobin (HGB) (HT): 12.5 g/dL (ref 11.5–16.0)
MCHC (HT): 34.3 g/dL (ref 32.0–36.0)
MCV (HT): 83 fL (ref 81–99)
Mean Corpuscular Hemoglobin (MCH) (HT): 28.4 pg (ref 26.0–34.0)
Platelets (HT): 307 10 3/uL (ref 150–450)
RBC (HT): 4.4 10 6/uL (ref 3.80–5.20)
RDW (HT): 14.1 % (ref 11.5–15.0)
WBC (HT): 17 10 3/uL — ABNORMAL HIGH (ref 4.0–10.0)

## 2022-08-21 ENCOUNTER — Encounter: Payer: Self-pay | Admitting: Internal Medicine

## 2022-08-21 ENCOUNTER — Ambulatory Visit: Payer: 59 | Admitting: Family

## 2022-08-21 ENCOUNTER — Ambulatory Visit: Payer: 59 | Admitting: Internal Medicine

## 2022-08-21 DIAGNOSIS — R7989 Other specified abnormal findings of blood chemistry: Secondary | ICD-10-CM

## 2022-08-21 DIAGNOSIS — R0683 Snoring: Secondary | ICD-10-CM

## 2022-08-21 DIAGNOSIS — J4542 Moderate persistent asthma with status asthmaticus: Secondary | ICD-10-CM

## 2022-08-21 DIAGNOSIS — J4540 Moderate persistent asthma, uncomplicated: Secondary | ICD-10-CM

## 2022-08-21 MED ORDER — LEVALBUTEROL TARTRATE 45 MCG/ACT IN AERO *I*
1.0000 | INHALATION_SPRAY | RESPIRATORY_TRACT | 0 refills | Status: DC | PRN
Start: 2022-08-21 — End: 2022-10-04

## 2022-08-21 NOTE — Progress Notes (Signed)
[facility] NOTE    Past Medical History: has Chronic midline low back pain without sciatica; Class 1 obesity; and Pre-diabetes on their problem list.      Medications:   Current Outpatient Medications   Medication Sig    amoxicillin-clavulanate (AUGMENTIN) 875-125 mg tablet Take 1 tablet by mouth every 12 hours for 5 days  for Pneumonia    albuterol HFA (PROVENTIL, VENTOLIN, PROAIR HFA) 108 (90 Base) MCG/ACT inhaler Inhale 1-2 puffs into the lungs every 6 hours as needed  Shake well before each use.    mupirocin (BACTROBAN) 2 % ointment Apply topically 3 times daily  for Wound Infection to the following areas: abdomen    fluocinolone 0.01% scalp (DERMA-SMOOTHE/FS) 0.01 % OIL external oil Apply topically nightly as needed  to the following areas: scaklp    ketoconazole (NIZORAL) 2 % shampoo Apply topically once a week  to the following areas: scalp    olopatadine (PATADAY) 0.2 % ophthalmic solution Place 1 drop into both eyes daily    gabapentin (NEURONTIN) 300 mg capsule Take 1 capsule (300 mg total) by mouth 3 times daily as needed    meloxicam (MOBIC) 7.5 mg tablet Take 1 tablet (7.5 mg total) by mouth daily     No current facility-administered medications for this visit.        Allergies:   Allergies   Allergen Reactions    Shellfish-Derived Products Anaphylaxis    Cinnamon Itching       HPI/ROS:     Theresa Bullock is a 41 yo F class 1 obesity presenting for telemedicine follow-up.    Last seen on 08/16/22 for URI symptoms, treated with Augmentin for post-viral CAP    Theresa Bullock was then admitted to [facility] from 11/23-11/25/23 for status asthmaticus.  Theresa Bullock was not intubated; Theresa Bullock did require BiPAP.  Received epinephrine and nebulizer from EMS in transit.  Received Mg, nebs, steroids, antibiotics in ED and was transferred to MICU.  Initial lactate 5.2, WBC 18.5, #Eos 0.6. (Free T4 0.8 and HgA1c 6.2.)  - Theresa Bullock is still wheezing.  Theresa Bullock still has a productive cough.    - Has been using Symbicort twice per day.  Received levalbuterol  nebulizer and hypertonic saline nebulizer - has been taking levalbuterol nightly.  Has not been able to take albuterol HFA due to jitteriness.    - Took one dose of Augmentin on the evening 11/22, the early morning 11/23.  - Rx on discharge for 5 days azithromycin + cefpodoxime.  7 days total.  - Rx on discharge for prednisone 60 mg daily x 5 days, 40 mg daily x 5 days, 20 mg daily x 5 days.      Snoring.  No history of observed apnea.  Tired throughout the day, groggy.  No falling asleep unintentionally.  Occasional headaches, not more often in the morning.    Reports that Theresa Bullock was called by bariatrics and told Theresa Bullock did not qualify for surgery.  BMI ws 42.18 at last office visit on 11/22.  No comorbities.    OBJECTIVE:      There were no vitals filed for this visit.    PHYSICAL EXAM (Telemedicine - Zoom):  Constitutional: Pleasant, comfortable, NAD  Pulm: Normal WOB, no coughing audible / wheezing    No results found for this or any previous visit (from the past 24 hour(s)).      ASSESSMENT/PLAN:     Theresa Bullock is a 41 yo F class 1 obesity presenting for telemedicine follow-up.  Last seen on 08/16/22 for URI symptoms, treated with Augmentin for post-viral CAP.  Theresa Bullock was then admitted to [facility] from 11/23-11/25/23 for status asthmaticus.  Theresa Bullock has never had a diagnosis of asthma before, nor has Theresa Bullock had symptoms concerning for asthma before.  Theresa Bullock is currently on cefpodoxime/doxy and a prolonged prednisone taper.    Discussed need for PFTs after completes steroid taper.  Theresa Bullock is interested in referral to [facility] pulm as hopes to be evaluated more quickly.  Discussed that will want an evaluation by me at end of steroid taper (after 12/10).    Moderate persistent asthma with status asthmaticus  -     Begin levalbuterol 45 MCG/ACT inhaler - Inhale 1-2 puffs into the lungs every 4 hours as needed for wheezing  Shake well before each use.  - Continue Symbicort two puffs BID  - Continue antibiotics as prescribed (Rx on  discharge for 5 days azithromycin + cefpodoxime.  7 days total.)  - Continue prednisone course (Rx on discharge for prednisone 60 mg daily x 5 days, 40 mg daily x 5 days, 20 mg daily x 5 days)  -     PFT Spirometry with Bronchodilator; Future  -     Refer to [facility] pulm    Class 3 Obesity, concern for OSA  -     [facility]  - TSH 0.20, free T4 0.8 (08/17/22).  Normal TSH in Jun-2023  - HgA1c 6.2 (08/17/22)    Follow up: 2-4 wks in person, earlier prn    ______________________  Glade Stanford MD

## 2022-08-21 NOTE — Telephone Encounter (Signed)
Called and left message.

## 2022-08-22 ENCOUNTER — Telehealth: Payer: Self-pay | Admitting: Internal Medicine

## 2022-08-22 MED ORDER — GENERIC DME *A*
0 refills | Status: DC
Start: 2022-08-22 — End: 2023-01-14

## 2022-08-22 NOTE — Telephone Encounter (Addendum)
Received a request from the pharmacy stating that the Levalbuterol HFA INH 15 GM  is not covered under the patients insurance they are recommending Albuterol HFA please advise thank you        Submitted to cover my meds a prior auth for the Levalbuterol HFA INH 15 GM inhaler waiting to hear if this was approved or denied

## 2022-08-22 NOTE — Telephone Encounter (Signed)
Telephone Call:    Called to complete Epworth Sleepiness Scale as required for [facility] referral.  Did not reach, so sent a MyChart msg.    Glade Stanford MD

## 2022-08-23 ENCOUNTER — Other Ambulatory Visit: Payer: Self-pay | Admitting: Internal Medicine

## 2022-08-23 ENCOUNTER — Ambulatory Visit: Payer: 59

## 2022-08-23 DIAGNOSIS — Z1231 Encounter for screening mammogram for malignant neoplasm of breast: Secondary | ICD-10-CM

## 2022-08-23 NOTE — Telephone Encounter (Signed)
Patient waiting for January appointments with Leeroy Cha, NP

## 2022-08-24 ENCOUNTER — Other Ambulatory Visit: Payer: Self-pay

## 2022-08-25 NOTE — Progress Notes (Signed)
TCM POST DISCHARGE CONTACT 3 OUTREACH    Care Management Intervention: Care Manager Intervention Completed       Risk of Admission or ED Visit  Current as of yesterday        10% 40 to 100%: High Risk   20 to < 40%: Medium Risk   0 to < 20%: Low Risk     Last Change:           This score indicates an adult patient's 1-year risk, as a percentage, of a hospital admission or ED visit.             Current PCP:  Frances Nickels, MD                  Spoke To:: patient      Are you feeling as good as you did when you left the hospital?: Yes         Has home care agency contacted patient yet?: N/A      If equipment was ordered at time of discharge, does the patient have this in the home?: N/A       Is the patient aware of pending orders / testing? : N/A      Do you have any questions about your discharge instructions?: No      Do you have an appointment scheduled with your PCP and know the date and time of that appointment?: Yes      Do you have transportation to get to that appointment?: Yes      Do you have all of your medications listed on your discharge summary and are you taking them as they are written on the bottle?: No  Comments: patient aware levoaluterol PA in process      Discharge medications reviewed and reconciled against outpatient MEDICAL RECORD NUMBER: Yes      Do you have any questions or concerns about your medications?: No         If the patient is newly initiated on anticoagulation, are they aware who is managing this?: N/A      Reviewed medications with:: patient      Time spent on the call with the patient:: 5 minutes      Patient Care Management assessment and plan of care/next steps:: TCM Contact 3 made via t/c with patient admitted to [facility] 11/23-11/25/2023 for hypoxia. Assessment as above, no concerns, pt well improved. Pt aware sleep study referral and levoalbuterol PA in process. FUV scheduled for 08/29/2022 with Dr. Fransico Setters.        Tessa L. Dareen Piano, BSN, Government social research officer

## 2022-08-25 NOTE — Patient Instructions (Signed)
We care about you and your health and are monitoring your health now that you're home from the hospital.  I will be your Transitional Care Manager and will be managing your care up to the next 30 days.   As I review your progress during this period of transition at home, you will see a Patient Outreach After Visit Summary in MyChart as a result of direct phone calls with you, or my review of your patient chart. This is a standard of care and there is no charge for this service.     Please feel free to reach out to me with any questions related to this.     If you have any urgent requests or issues, please call your doctor's office.    Tessa L. Dareen Piano BSN, RN  Clinical Care Manager  [phone]

## 2022-08-29 ENCOUNTER — Ambulatory Visit: Payer: 59 | Admitting: Internal Medicine

## 2022-08-29 ENCOUNTER — Other Ambulatory Visit: Payer: Self-pay

## 2022-08-29 VITALS — BP 134/80 | HR 80 | Ht 64.0 in | Wt 215.0 lb

## 2022-08-29 DIAGNOSIS — E669 Obesity, unspecified: Secondary | ICD-10-CM

## 2022-08-29 DIAGNOSIS — R0683 Snoring: Secondary | ICD-10-CM

## 2022-08-29 DIAGNOSIS — J4540 Moderate persistent asthma, uncomplicated: Secondary | ICD-10-CM

## 2022-08-29 MED ORDER — BUDESONIDE-FORMOTEROL FUMARATE 80-4.5 MCG/ACT IN AERO *I*
2.0000 | INHALATION_SPRAY | Freq: Two times a day (BID) | RESPIRATORY_TRACT | 5 refills | Status: DC
Start: 2022-08-29 — End: 2022-10-10

## 2022-08-29 NOTE — Progress Notes (Signed)
[facility] NOTE    Past Medical History: has Chronic midline low back pain without sciatica; Class 1 obesity; and Pre-diabetes on their problem list.      Medications:   Current Outpatient Medications   Medication Sig    generic DME Sleep Study    levalbuterol 45 MCG/ACT inhaler Inhale 1-2 puffs into the lungs every 4 hours as needed for Wheezing  Shake well before each use.    budesonide-formoterol (SYMBICORT) 80-4.5 MCG/ACT inhaler Inhale 2 puffs into the lungs 2 times daily    budesonide (PULMICORT) 0.5 mg/42mL nebulizer suspension Inhale 2 mLs (0.5 mg total) into the lungs daily    azithromycin (ZITHROMAX) 250 mg tablet Take 1 tablet (250 mg total) by mouth daily    benzonatate (TESSALON) 200 mg capsule Take 1 capsule (200 mg total) by mouth 3 times daily as needed    cefpodoxime (VANTIN) 200 mg tablet Take 2 tablets (400 mg total) by mouth 2 times daily    DEXTROMETHORPHAN-GUAIFENESIN 10-100 MG/5ML syrup Take 10 mLs by mouth every 4 hours as needed    levalbuterol (XOPENEX) 0.63 mg/68mL nebulizer solution Inhale 3 mLs (0.63 mg total) by nebulization every 4 hours as needed for Shortness of Breath or Wheezing    predniSONE (DELTASONE) 20 mg tablet Take 3 tablets (60 mg total) by mouth daily  60 mg x 5 days, 40 mg x 5 days, 20 mg daily x 5 days    sodium chloride 3 % nebulizer solution Inhale 4 mLs by nebulization 2 times daily    mupirocin (BACTROBAN) 2 % ointment Apply topically 3 times daily  for Wound Infection to the following areas: abdomen    fluocinolone 0.01% scalp (DERMA-SMOOTHE/FS) 0.01 % OIL external oil Apply topically nightly as needed  to the following areas: scaklp    ketoconazole (NIZORAL) 2 % shampoo Apply topically once a week  to the following areas: scalp    olopatadine (PATADAY) 0.2 % ophthalmic solution Place 1 drop into both eyes daily    gabapentin (NEURONTIN) 300 mg capsule Take 1 capsule (300 mg total) by mouth 3 times daily as needed    meloxicam (MOBIC) 7.5 mg tablet Take 1 tablet (7.5 mg  total) by mouth daily     No current facility-administered medications for this visit.        Allergies:   Allergies   Allergen Reactions    Shellfish-Derived Products Anaphylaxis    Cinnamon Itching       HPI/ROS:     Theresa Bullock is a 41 yo F recently diagnosed asthma (hospitalized for status asthmaticus, 08/17/22-08/19/22), class 3 obesity presenting close follow-up.    - [facility] admission from 11/23-11/25/23 for status asthmaticus (required BiPAP; epinephrine + nebulizers from EMS).  Notable labs: Initial lactate 5.2, WBC 18.5, #Eos 0.6. Free T4 0.8 and HgA1c 6.2.  - Has now completed 7 day course of CTX/cefpoxodime + azithromycin.  Has not yet completed prolonged prednisone 15 day taper (60mg  x 5d, 40mg  x 5d, 20 mg x 5d), scheduled by clinical care manager for 12/05.  Scheduled for PFTs on 12/07.  (Will be finishing PFTs on 12/10 ***)  On Symbicort.    Today is the last day of prednisone 40 mg, tomorrow Theresa Bullock starts 20 mg x 5 days  Wheezing and coughing improved this weekend.  Theresa Bullock stopped using the nebulizers on Saturday  Theresa Bullock stopped the Symbicort on Sunday as well.  Last episode of wheezing on Sunday morning.    Coughing has gone too.  Minor tickle in the throat.  Last cough on Saturday    ***    OBJECTIVE:      Vitals:    08/29/22 1342   BP: 134/80   BP Location: Left arm   Patient Position: Sitting   Pulse: 80   Weight: 97.5 kg (215 lb)   Height: 1.626 m (5\' 4" )       PHYSICAL EXAM:  Constitutional: Pleasant, comfortable, NAD  Pulm: Normal WOB, no coughing audible / wheezing    No results found for this or any previous visit (from the past 24 hour(s)).      ASSESSMENT/PLAN:     Theresa Bullock is a 41 yo F class 1 obesity presenting for telemedicine follow-up.  Last seen on 08/16/22 for URI symptoms, treated with Augmentin for post-viral CAP.  Theresa Bullock was then admitted to [facility] from 11/23-11/25/23 for status asthmaticus.  Theresa Bullock has never had a diagnosis of asthma before, nor has Theresa Bullock had symptoms concerning  for asthma before.  Theresa Bullock is currently on cefpodoxime/doxy and a prolonged prednisone taper.    Discussed need for PFTs after completes steroid taper.  Theresa Bullock is interested in referral to [facility] pulm as hopes to be evaluated more quickly.  Discussed that will want an evaluation by me at end of steroid taper (after 12/10).    Moderate persistent asthma with status asthmaticus  -     Begin levalbuterol 45 MCG/ACT inhaler 1-2 puffs into the lungs every 4 hours as needed for wheezing.  (Awaiting prior authorization as had palpitations with albuterol)  - Continue Symbicort two puffs BID  - Continue antibiotics as prescribed (Rx on discharge for 5 days azithromycin + cefpodoxime.  7 days total.)  - Continue prednisone course (Rx on discharge for prednisone 60 mg daily x 5 days, 40 mg daily x 5 days, 20 mg daily x 5 days)  -     PFT Spirometry with Bronchodilator; Future  -     Refer to [facility] pulm    Class 3 Obesity, concern for OSA  -     [facility]  - TSH 0.20, free T4 0.8 (08/17/22).  Normal TSH in Jun-2023  - HgA1c 6.2 (08/17/22)    Follow up: 2-4 wks in person, earlier prn    ______________________  Glade Stanford MD

## 2022-08-31 ENCOUNTER — Ambulatory Visit: Payer: 59

## 2022-09-01 ENCOUNTER — Other Ambulatory Visit: Payer: 59

## 2022-09-08 ENCOUNTER — Encounter: Payer: Self-pay | Admitting: Internal Medicine

## 2022-09-11 ENCOUNTER — Ambulatory Visit
Admission: RE | Admit: 2022-09-11 | Discharge: 2022-09-11 | Disposition: A | Discharge status: Home / Self Care | Payer: 59 | Source: Ambulatory Visit | Attending: Internal Medicine | Admitting: Internal Medicine

## 2022-09-11 ENCOUNTER — Other Ambulatory Visit: Payer: Self-pay

## 2022-09-11 DIAGNOSIS — Z803 Family history of malignant neoplasm of breast: Secondary | ICD-10-CM

## 2022-09-11 DIAGNOSIS — Z1231 Encounter for screening mammogram for malignant neoplasm of breast: Secondary | ICD-10-CM | POA: Insufficient documentation

## 2022-09-14 NOTE — Telephone Encounter (Signed)
Messaged on mychart.

## 2022-10-02 ENCOUNTER — Telehealth: Payer: Self-pay | Admitting: Internal Medicine

## 2022-10-02 NOTE — Telephone Encounter (Signed)
Pt asked to move up appt to discuss chronic issues and asthma.   No current distress but doesn't want to wait for the 22nd.  Appt this week with dr Fransico Setters

## 2022-10-04 ENCOUNTER — Ambulatory Visit: Payer: PRIVATE HEALTH INSURANCE | Admitting: Internal Medicine

## 2022-10-04 ENCOUNTER — Other Ambulatory Visit: Payer: Self-pay

## 2022-10-04 ENCOUNTER — Other Ambulatory Visit
Admission: RE | Admit: 2022-10-04 | Discharge: 2022-10-04 | Disposition: A | Discharge status: Home / Self Care | Payer: PRIVATE HEALTH INSURANCE | Source: Ambulatory Visit | Attending: Internal Medicine | Admitting: Internal Medicine

## 2022-10-04 VITALS — BP 116/79 | HR 82 | Ht 64.0 in | Wt 221.0 lb

## 2022-10-04 DIAGNOSIS — J4540 Moderate persistent asthma, uncomplicated: Secondary | ICD-10-CM

## 2022-10-04 DIAGNOSIS — Z23 Encounter for immunization: Secondary | ICD-10-CM

## 2022-10-04 DIAGNOSIS — E669 Obesity, unspecified: Secondary | ICD-10-CM | POA: Insufficient documentation

## 2022-10-04 DIAGNOSIS — R7989 Other specified abnormal findings of blood chemistry: Secondary | ICD-10-CM | POA: Insufficient documentation

## 2022-10-04 LAB — COMPREHENSIVE METABOLIC PANEL
ALT: 18 U/L (ref 0–35)
AST: 19 U/L (ref 0–35)
Albumin: 3.9 g/dL (ref 3.5–5.2)
Alk Phos: 49 U/L (ref 35–105)
Anion Gap: 10 (ref 7–16)
Bilirubin,Total: 0.5 mg/dL (ref 0.0–1.2)
CO2: 24 mmol/L (ref 20–28)
Calcium: 9.2 mg/dL (ref 8.8–10.2)
Chloride: 108 mmol/L (ref 96–108)
Creatinine: 0.71 mg/dL (ref 0.51–0.95)
Glucose: 99 mg/dL (ref 60–99)
Lab: 7 mg/dL (ref 6–20)
Potassium: 4.5 mmol/L (ref 3.3–5.1)
Sodium: 142 mmol/L (ref 133–145)
Total Protein: 6.4 g/dL (ref 6.3–7.7)
eGFR BY CREAT: 108 *

## 2022-10-04 LAB — TSH: TSH: 0.5 u[IU]/mL (ref 0.27–4.20)

## 2022-10-04 LAB — T4, FREE: Free T4: 1.1 ng/dL (ref 0.9–1.7)

## 2022-10-04 MED ORDER — ALBUTEROL SULFATE HFA 108 (90 BASE) MCG/ACT IN AERS *I*: 1.0000 | INHALATION_SPRAY | Freq: Four times a day (QID) | RESPIRATORY_TRACT | 5 refills | Status: DC | PRN

## 2022-10-04 MED ORDER — MONTELUKAST SODIUM 10 MG PO TABS *I*
10.0000 mg | ORAL_TABLET | Freq: Every evening | ORAL | 3 refills | Status: DC
Start: 2022-10-04 — End: 2024-05-07

## 2022-10-04 NOTE — Progress Notes (Signed)
Theresa NOTE    Past Medical History: has Chronic midline low back pain without sciatica; Class 1 obesity; and Pre-diabetes on their problem list.      Medications:   Current Outpatient Medications   Medication Sig    budesonide-formoterol (SYMBICORT) 80-4.5 Bullock/ACT inhaler Inhale 2 puffs into the lungs 2 times daily    generic DME Sleep Study    levalbuterol 45 Bullock/ACT inhaler Inhale 1-2 puffs into the lungs every 4 hours as needed for Wheezing  Shake well before each use.    levalbuterol (XOPENEX) 0.63 mg/33mL nebulizer solution Inhale 3 mLs (0.63 mg total) by nebulization every 4 hours as needed for Shortness of Breath or Wheezing    mupirocin (BACTROBAN) 2 % ointment Apply topically 3 times daily  for Wound Infection to the following areas: abdomen    fluocinolone 0.01% scalp (DERMA-SMOOTHE/FS) 0.01 % OIL external oil Apply topically nightly as needed  to the following areas: scaklp    ketoconazole (NIZORAL) 2 % shampoo Apply topically once a week  to the following areas: scalp    olopatadine (PATADAY) 0.2 % ophthalmic solution Place 1 drop into both eyes daily    meloxicam (MOBIC) 7.5 mg tablet Take 1 tablet (7.5 mg total) by mouth daily     No current facility-administered medications for this visit.        Allergies:   Allergies   Allergen Reactions    Shellfish-Derived Products Anaphylaxis    Cinnamon Itching       HPI/ROS:     Theresa Bullock is a 42 yo F recently diagnosed asthma (hospitalized for status asthmaticus, 08/17/22-08/19/22), class 2 obesity presenting close follow-up.    Last seen on 08/29/22 - Symptoms had improved after prednisone course for first asthma exacerbation.  Discussed Symbicort BID, PFTs (sched for 10/25/22).    Normal mammogram on 09/11/22    [name] reports that she is only using the Symbicort twice per week.  Reporting that she doesn't feeling any closing of her throat which is why she stopped it.  She still has a cough.  Still has wheezing in the evening.   Wheezing more with milk,  egg .    [name] hasn't been using Symbicort regularly because she is worried that it has made her swollen and gain weight, and she feels puffier.  No changes in her appetite.  Has been feeling a lot more tired.  Having some more back pain as well, triggered by standing for more than five minutes.  Lifting things off the ground will cause pain.  Scheduled to see Dr Theressa Stamps on 10/18/22.  Has been taking the meloxicam as needed once or twice a week.      [name] is concerned that she has a pulsatile woosh sensation in her right ear.  Noticed in October or so.    Occasionally feeling dizzy during the day.  A head spinning sensation, not triggered by anything in particular.  She doesn't have to sit, comes-and-goes.  Has not been triggered by head movements or ocular movements.  Tends to happen while she is sitting.  Lasts for moments, occurs every day, started in May or so.    Her first episode of dizziness with room spinning was after she begun taking her mother's Rybelsus for two weeks.  Rx meclizine, which was helpful.  Hasn't tried meclizine recently as the times were so short.  (Saw Dr Chales Abrahams on 05/02/22 for vertigo, [facility] primary care).    Theresa Bullock reports she needs a letter stating  that her husband needs to work remotely instead of in [place] so that he will be available.     OBJECTIVE:      Vitals:    10/04/22 1302   BP: 116/79   BP Location: Right arm   Patient Position: Sitting   Cuff Size: large adult   Pulse: 82   SpO2: 98%   Weight: 100.2 kg (221 lb)   Height: 1.626 m (5\' 4" )       PHYSICAL EXAM:  Constitutional: Pleasant, comfortable, NAD  ENT: Otoscopic exam benign, no erythema or exudate behind TMs.  CV: RRR, no murmur, no LE edema  Pulm: Normal WOB, CTAB, no w/r/r  Neuro:   - AOx3.    - PERRL, EOMI.  V1-V3 sensation symmetric, intact.  Symmetric smile, eyelid strength intact.  Hearing intact bilaterally.  Palate/uvula midline.  SCM strength intact.  - finger-to-nose intact, no dysdiadokinesia, negative  Romberg    No results found for this or any previous visit (from the past 24 hour(s)).      ASSESSMENT/PLAN:     [name] is a 42 yo F recently diagnosed asthma (hospitalized for status asthmaticus, 08/17/22-08/19/22), class 2 obesity presenting close follow-up.    Theresa Bullock was then admitted to [facility] from 11/23-11/25/23 for status asthmaticus.  She had never had a diagnosis of asthma before, nor had she had symptoms concerning for asthma before.  She is still having nocturnal cough and wheezing daily on Symbicort prn.  As she appears to have a strong allergic component (wheezing with egg, milk) and had significant eosinophilia on initial diagnosis, plan to begin montelukast for asthma control.  After initiating montelukast, will see if can reduce Symbicort from BID to prn as she is concerned that she has been gaining weight on Symbicort.    [name] is still due for home sleep study, previously completed Epsworth Sleepiness Scale and [facility].    Reports an ear wooshing sensation + dizziness.  Normal otoscopic exam and neurologic exam, but will refer to ENT if symptoms worsen.    Moderate persistent asthma with recent status asthmaticus  -     Continue albuterol 1-2 puffs every 4-6 hrs as needed.  (Attempted to prescribe levalbuterol given prior palpitations with albuterol, but PA was not approved)  - Restart Symbicort 80-4.5 two puffs BID  - Begin montelukast 10 mg nightly  - After a week of Symbicort BID + montelukast nightly, discussed trial of Symbicort prn and montelukast nightly, restarting prn if wheezing/cough more than twice per week.  -     PFT Spirometry with Bronchodilator scheduled  -     Scheduled for [facility]; discussed following up with pulmonologist she saw inpatient    Class 2 Obesity, concern for OSA (snoring), with recent weight gain  -     [facility]  - TSH 0.20, free T4 0.8 (08/17/22).  Normal TSH in Jun-2023  - HgA1c 6.2 (08/17/22)  - Repeat  TSH and CMP    Need for immunization against influenza  -     Flu vaccine quadrivalent greater than or equal to 77mo preservative free IM      Follow up: 2 mo, earlier prn    ______________________  Glade Stanford MD

## 2022-10-04 NOTE — Patient Instructions (Signed)
-   Begin using Symbicort twice per day.  - Begin taking montelukast (Singulair) every evening at bedtime.    After a week of the Symbicort twice a day and montelukast in the evening,   You can try only taking the Symbicort as needed.  If you are having wheezing or coughing more than twice per week taking the Symbicort as an as needed, then go back to taking the Symbicort twice a day.    Let me know if you notice a worsening of the dizziness or the wooshing over the next couple months.  If it worsens, I'll have you see an ear specialist.  If it improves, then we know that the anti-allergy montelukast helped.

## 2022-10-10 ENCOUNTER — Ambulatory Visit: Payer: PRIVATE HEALTH INSURANCE

## 2022-10-10 ENCOUNTER — Encounter: Payer: Self-pay | Admitting: Pulmonology

## 2022-10-10 ENCOUNTER — Other Ambulatory Visit: Payer: Self-pay

## 2022-10-10 ENCOUNTER — Other Ambulatory Visit
Admission: RE | Admit: 2022-10-10 | Discharge: 2022-10-10 | Disposition: A | Discharge status: Home / Self Care | Payer: PRIVATE HEALTH INSURANCE | Source: Ambulatory Visit | Attending: Pulmonology | Admitting: Pulmonology

## 2022-10-10 ENCOUNTER — Ambulatory Visit: Payer: PRIVATE HEALTH INSURANCE | Attending: Pulmonology | Admitting: Pulmonology

## 2022-10-10 VITALS — BP 141/84 | HR 76 | Temp 98.4°F | Ht 64.0 in | Wt 225.0 lb

## 2022-10-10 DIAGNOSIS — J4540 Moderate persistent asthma, uncomplicated: Secondary | ICD-10-CM

## 2022-10-10 LAB — PULMONARY FUNCTION TEST
DLCO Z-score: -1.55
DLCO: 17.01 mL/min/mmHg
FEV1 % Reversibility: 4 %
FEV1 (L): 2.74 L
FEV1 Post (L): 2.85 L
FEV1 Post Z-score: 0.87
FEV1 Z-score: 0.55
FEV1/FVC Post Z-score: 1.28 %
FEV1/FVC Post: 89 %
FEV1/FVC Z-score: 0.35
FEV1/FVC: 84 %
FVC (L): 3.27 L
FVC Post (L): 3.21 L
FVC Post Z-score: 0.23
FVC Z-score: 0.37

## 2022-10-10 MED ORDER — BUDESONIDE-FORMOTEROL FUMARATE 160-4.5 MCG/ACT IN AERO *I*
2.0000 | INHALATION_SPRAY | Freq: Two times a day (BID) | RESPIRATORY_TRACT | 3 refills | Status: DC
Start: 2022-10-10 — End: 2022-10-25

## 2022-10-10 MED ORDER — ALBUTEROL SULFATE HFA 108 (90 BASE) MCG/ACT IN AERS (MULTIPLE PATIENTS) *I*
4.0000 | INHALATION_SPRAY | Freq: Once | RESPIRATORY_TRACT | Status: AC
Start: 2022-10-10 — End: 2022-10-10
  Administered 2022-10-10: 4 via RESPIRATORY_TRACT

## 2022-10-10 NOTE — Addendum Note (Signed)
Addended byAyesha Mohair on: 10/10/2022 02:49 PM     Modules accepted: Orders

## 2022-10-10 NOTE — Progress Notes (Cosign Needed)
PULMONOLOGY CLINIC  NEW EVALUATION    Dear Dr. Fransico Setters,    I had the pleasure of seeing Theresa Bullock as a new patient today for asthma.  Please allow me to review the patient's history for my records.     History of present illness:   Patient is a 42 year old female with past medical history of obesity, prediabetes, sickle cell trait, asthma now presenting for establishment of care with pulmonology for asthma.     She reportedly was hospitalized from 75 23-11 25 for status asthmaticus with symptom improvement after prednisone course.  To recap her hospitalization, she apparently had upper respiratory viral type symptoms ongoing for 4 weeks prior to presentation before developing acute onset shortness of breath with noted tachypnea and wheezing.  When EMS arrived, her oxygen saturation was 81%. She was coughing up thick yellow; had been ongoing since Halloween. Symptoms steadily worsened until Thanksgiving night. Took one augmentin approximately 5PM and then again at midnight. Turning on the shower briefly helped as did going outside to breathe in cold air. Never happened before. EMS was called at 2AM on 08/17/22. On her way into the hospital via EMS, she was given epinephrine in case there was an anaphylactic component.  At the hospital, she was given Solu-Medrol IV as well as magnesium and DuoNebs.  She was placed on BiPAP but never intubated.  It seems that she had a relatively rapid improvement.  She was sent home on Symbicort and albuterol.  At her last PCP appointment of 1/10, patient states she is only using Symbicort twice a week.    Almost back to baseline now, but still with nocturnal wheezing and mild productive cough.     Taking symbicort bid with the last use of albuterol 2 weeks ago.     Eosinophilia of 0.6 was noted on 08/15/2021 as well as on 08/17/22.     Has been experiencing pulsatile tinnitus in the right ear since Halloween. Reflux symptoms ongoing since pregnancy, but do seem to be worse with  prednisone.     Was diagnosed with psoriasis approximately 20 years (on fluocinolone)    Has taken augmentin and/or penicillin in the past without any issues.       Review of Systems  A complete 10 point ROS was performed and negative unless otherwise specified in the HPI    Pulmonary Medications:  Albuterol as needed, montelukast 10 mg nightly, Symbicort 2 puffs twice daily  Additional medications reviewed with patient    Medical and Surgical History:    Past Medical History:   Diagnosis Date    Obesity     Pre-diabetes     Sickle cell trait      Past Surgical History:   Procedure Laterality Date    abdominoplasty      liposuction & abdominoplasty c/b cellulitis (2020)       Social History:    Pets:  Dog (husky); has had for 10 years   Industrial/occupational exposures: Charity fundraiser, travel nurse  TB risk factors: none  Smoking history: none  Alcohol intake: occasional   Substance use: none  Travel: none  Home: In [place] (42 years old, with basement, minimal possibility of mold)  Hobbies: Raising kids  No history of asthma/eczema    Family History:  Family History   Problem Relation Age of Onset    Diabetes Mother     Breast cancer Mother     Stroke Mother     Heart failure Mother  Diabetes Father     High Blood Pressure Father     Cataracts Father     Sickle cell trait Father     High Blood Pressure Maternal Grandmother     Pancreatic Cancer Maternal Grandfather     Diabetes Paternal Grandmother     Breast cancer Maternal Aunt     Alzheimer's disease Maternal Aunt     Diabetes Maternal Uncle     Stroke Maternal Uncle     Autism Son     Colon cancer Neg Hx     Thyroid cancer Neg Hx        Objective    Vital Signs:  There were no vitals filed for this visit.  SpO2 Readings from Last 3 Encounters:   10/04/22 98%   08/16/22 97%   06/19/22 100%        Physical Exam  General: NAD  HEENT: Anicteric, nasal mucosa, MMM  Cardiac: RRR, S1 and S2, no murmurs appreciated  Lungs:  CTAB  Abdomen: S, NT, ND, NA BS present  Extremities:  no clubbing, cyanosis, joint deformities, or edema  Neuro: AAOx3, no focal deficits    Pulmonary Functions Testing Results:  No PFTs in our system     Imaging (personally reviewed):  CT chest pulmonary embolism protocol done on 08/17/2022 without findings of acute abnormalities.  Unable to see the actual images as they are from outside system.    Transthoracic echocardiogram was done on 08/18/2022 with finding of normal left ventricular size and systolic function, ejection fraction 65%, normal diastolic function, normal right ventricular size and systolic function, and no significant valvular regurgitation or stenosis    Assessment and Plan:   Theresa Bullock is a 42 y.o. female with a PMH significant for obesity and asthma now presenting for establishment of asthma care in the pulmonology clinic    Concern for asthma versus anaphylaxis vs viral upper respiratory infection that potentially led to asthma-like exacerbation. Never diagnosed with asthma in past, no family h/o asthma or atopic dermatitis but does have a personal history of atopic dermatitis. Did take 2 doses of augmentin just prior to acute worsening of symptoms. Does have slightly elevated eosinophil count. RSV could have been the etiology of the prolonged respiratory symptoms prior to Thanksgiving.     -will refer to allergy/immunology for testing to ensure that PCN allergy is not etiology  -will have patient perform PFTs today (spiro with bronchodilator, DLCO, and FENO). Patient did not take her symbicort this AM.  -continue with symbicort 2 puffs bid as well as albuterol as needed for rescue       Thank you for involving me in Theresa Bullock's care. If there are any questions, please do not hesitate to contact me directly.    Beckey Downing, MD  Pulmonary/Critical Care Fellow  10/10/22 10:01 AM    Pulmonary Attending:    I saw and evaluated the patient with Dr. Doran Clay.  I agree with the database, findings, and plan of care recorded in his note.  Please  refer to it for details.  The attached note reflects my concurrent input. Presentation favors viral induced exacerbation given progression over weeks. Less likely pcn allergy but reasonable to get input from allergy/immunology colleagues.     Irena Reichmann, MD    I personally spent 45 minutes on the calendar day of the encounter, including pre and post visit work.

## 2022-10-10 NOTE — Patient Instructions (Signed)
VIEW YOUR HEALTH INFORMATION ONLINE TODAY!    Below is your activation code to sign up for MyChart, [facility]'s free website that allows you to view most test results, send and receive messages from your doctor, renew your prescriptions, request an appointment, and much more!    How Do I Sign Up?    Important:  You must have an email address to sign up for MyChart.    Go to [email or web address]  Click on the Sign Up (I have a code) link under "New User?" located in the blue box on the left hand side of the screen.    Enter your MyChart Activation Code: Activation code not generated  Current [place] MyChart Status: Active  Enter your Date of Birth, Zip Code and Phone Number, and click NEXT.  Continue following the instructions to complete your MyChart sign up.    You also may want to write down your Username and Password below:    MY USERNAME:  _______________________________________________    MY PASSWORD: _______________________________________________    Additional Information    If you are not the patient, but you are involved in the health care of this patient, DO NOT USE THIS CODE!  Instead, go to the MyChart website [email or web address]) and click on "Access for My Kids/Family/Friends" below the username and password fields.    Questions?    Call our MyChart Customer Service Center, 8 a.m. to 5 p.m.   Weekdays: [phone]), [phone]; choose Option 1

## 2022-10-11 LAB — NORTHEAST ALLERGY PROFILE G
Alternaria Tenuis IgE: <0.1 kU/L
Dog Dander: 2.59 kU/L
Grass,Orchard IgE: 1.92 kU/L
IgE: 94 kU/L (ref 0–158)
Ragweed,Short IgE: 3.21 kU/L
Tree,Birch IgE: 0.23 kU/L

## 2022-10-11 LAB — RAST INTERPRETATION

## 2022-10-12 LAB — NORTHEAST ALLERGY PROFILE G
A.fumigatus IgE: <0.1 kU/L
Cat Epi/Dander IgE: 0.19 kU/L
Cladosporium IgE: <0.1 kU/L
Cockroach,German IgE: <0.1 kU/L
DF Mites IgE: 0.13 kU/L
DP Mites IgE: 0.12 kU/L
Grass,Kentucky Blue IgE: 2.54 kU/L
IgE: 94 kU/L (ref 0–158)
Lamb's Quarters IgE: <0.1 kU/L
Tree,Birch IgE: 0.23 kU/L
Tree,Box Elder/Maple IgE: <0.1 kU/L
Tree,Elm IgE: 0.32 kU/L

## 2022-10-13 LAB — STRONGYLOIDES AB: Strongyloides Ab: 0.2 IV (ref <=0.9)

## 2022-10-16 ENCOUNTER — Ambulatory Visit: Payer: 59 | Admitting: Internal Medicine

## 2022-10-18 ENCOUNTER — Ambulatory Visit: Payer: PRIVATE HEALTH INSURANCE | Admitting: Physical Medicine and Rehabilitation

## 2022-10-18 ENCOUNTER — Encounter: Payer: Self-pay | Admitting: Physical Medicine and Rehabilitation

## 2022-10-18 ENCOUNTER — Other Ambulatory Visit: Payer: Self-pay

## 2022-10-18 ENCOUNTER — Encounter: Payer: Self-pay | Admitting: Internal Medicine

## 2022-10-18 VITALS — BP 131/93 | HR 87 | Ht 64.0 in | Wt 217.0 lb

## 2022-10-18 DIAGNOSIS — M545 Low back pain, unspecified: Secondary | ICD-10-CM

## 2022-10-18 NOTE — Progress Notes (Signed)
ID: Theresa Bullock is a 43 y.o. year old  bilateral lumbo sacral  pain     CC:  Chief Complaint   Patient presents with    Lower Back - New Patient Visit    Pain     HPI:  began in pregnancy last one 2018;  slowly worse     SYMPTOMS LOCATION: as above     SEVERITY: variable unbearable;  20 lf 10 like labor     EXACERBATING FACTOR: standing , bending , sweekping j    ALLEVIATING FACTORS: sitting , leaning forward.     EVALUATION AND WORK UP: xray has been done. 2022     TREATMENTS: in past they ordered some meloxicam.  It helps but makes her drowsi.  No other meds.    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    Medical History:  Past Medical History:   Diagnosis Date    Obesity     Pre-diabetes     Sickle cell trait        Surgical History:  Past Surgical History:   Procedure Laterality Date    abdominoplasty      liposuction & abdominoplasty c/b cellulitis (2020)             ZOX:WRUEAV of Systems , 10 systems reviewed all normal  .         Allergies:  Allergies   Allergen Reactions    Shellfish-Derived Products Anaphylaxis    Cinnamon Itching        Vitals:    10/18/22 1401   BP: (!) 131/93   Pulse: 87   Weight: 98.4 kg (217 lb)   Height: 1.626 m (5\' 4" )     Body mass index is 37.25 kg/m.  Physical Exam:    Observations: She is awake alert oriented clear cogent no acute distress.            [[[[[A andn o cand co nad; twdwg;; nba;; snag; up and oneasy;; slr neg. No ttp; hip range normal;; ssr noarma.,   Presentation:.  Timely fashion well-dressed well-groomed  Affect:.  Normal bright affable  Gait and station:.  Stable nonantalgic  Postures:.  Sitting posture normal standing station normal    Testing:.  They get up and out of a chair independently and without difficulty,  get up and onto an exam table independently and without difficulty.  Straight leg raise is negative.  There is no deformity of the cervical thoracic or  lumbar spine to visual or palpatory inspection.  There is no tenderness over the subgluteal or trochanteric bursa. There is no atrophy of either upper or lower extremity.  Hip range of motion does not reproduce  symptoms.    Range of motion:.  Forward flexion hands to shins      Skin:.  There is no lymphadenopathy in the cervical, inguinal or axillary regions.  Skin is intact in all four limbs and in the trunk.  Tibial and radial artery pulses are intact bilaterally.  Lymph:Marland Kitchen  Pulses:.    Neuromuscular:Marland Kitchen    Motor Strength:.  Strength is normal in the shoulder abductors, elbow flexors, wrist extensors and finger intrinsics bilaterally.  In the lower limbs strength is normal in the hip flexors, knee extensors, ankle dorsiflexors, and in the EHL bilaterally    Sensory Exam:.  Sensation is normal to light touch to all  dermatomes in the upper and lower extremities bilaterally.    Reflexes:.  Reflexes are symmetric and normal at the biceps, triceps, brachioradialis, patellar tendon, and Achilles tendon bilaterally.  Toes are downgoing.  Romberg is negative.    Imaging:.  08/17/2021 10:31 AM     No evidence for fracture.      Minimal degenerative changes with facet arthropathy most prominent at the L5-S1 level.      END OF IMPRESSION       Other Diagnostics:.    Assessment:.  LBP; possible facet mediated pain  Begin PT and therec program taught today  Instru;cted in 20 min dailyprogram.  Plan:.  Continue meloxicam  Continue HEP  Begin PT>    Follow-up in 6 weeks    Should she have any significant exacerbation or worsening while beginning therapeutic exercise she will contact me and we may need to pursue additional imaging before that

## 2022-10-24 ENCOUNTER — Emergency Department
Admission: EM | Admit: 2022-10-24 | Discharge: 2022-10-25 | Disposition: A | Discharge status: Home / Self Care | Payer: PRIVATE HEALTH INSURANCE | Source: Ambulatory Visit | Attending: Emergency Medicine | Admitting: Emergency Medicine

## 2022-10-24 ENCOUNTER — Emergency Department: Payer: PRIVATE HEALTH INSURANCE | Admitting: Radiology

## 2022-10-24 ENCOUNTER — Other Ambulatory Visit: Payer: Self-pay

## 2022-10-24 ENCOUNTER — Telehealth: Payer: Self-pay | Admitting: Student in an Organized Health Care Education/Training Program

## 2022-10-24 ENCOUNTER — Encounter: Payer: Self-pay | Admitting: Internal Medicine

## 2022-10-24 ENCOUNTER — Telehealth: Payer: Self-pay | Admitting: Pulmonology

## 2022-10-24 ENCOUNTER — Encounter: Payer: Self-pay | Admitting: Pulmonology

## 2022-10-24 DIAGNOSIS — R062 Wheezing: Secondary | ICD-10-CM | POA: Insufficient documentation

## 2022-10-24 DIAGNOSIS — Z1159 Encounter for screening for other viral diseases: Secondary | ICD-10-CM | POA: Insufficient documentation

## 2022-10-24 DIAGNOSIS — R0602 Shortness of breath: Secondary | ICD-10-CM

## 2022-10-24 DIAGNOSIS — R509 Fever, unspecified: Secondary | ICD-10-CM

## 2022-10-24 DIAGNOSIS — Z1152 Encounter for screening for COVID-19: Secondary | ICD-10-CM | POA: Insufficient documentation

## 2022-10-24 DIAGNOSIS — Z20822 Contact with and (suspected) exposure to covid-19: Secondary | ICD-10-CM

## 2022-10-24 DIAGNOSIS — R059 Cough, unspecified: Secondary | ICD-10-CM

## 2022-10-24 DIAGNOSIS — J101 Influenza due to other identified influenza virus with other respiratory manifestations: Secondary | ICD-10-CM

## 2022-10-24 LAB — CBC AND DIFFERENTIAL
Baso # K/uL: 0.1 10*3/uL (ref 0.0–0.2)
Basophil %: 0.7 %
Eos # K/uL: 0.2 10*3/uL (ref 0.0–0.5)
Eosinophil %: 3 %
Hematocrit: 44 % (ref 34–49)
Hemoglobin: 14.5 g/dL (ref 11.2–16.0)
IMM Granulocytes #: 0 10*3/uL (ref 0.0–0.0)
IMM Granulocytes: 0.3 %
Lymph # K/uL: 2.9 10*3/uL (ref 1.0–5.0)
Lymphocyte %: 39.1 %
MCH: 28 pg (ref 26–32)
MCHC: 33 g/dL (ref 32–36)
MCV: 85 fL (ref 75–100)
Mono # K/uL: 0.8 10*3/uL (ref 0.1–1.0)
Monocyte %: 10.1 %
Neut # K/uL: 3.5 10*3/uL (ref 1.5–6.5)
Nucl RBC # K/uL: 0 10*3/uL (ref 0.0–0.0)
Nucl RBC %: 0 /100 WBC (ref 0.0–0.2)
Platelets: 270 10*3/uL (ref 150–450)
RBC: 5.1 MIL/uL (ref 4.0–5.5)
RDW: 13.2 % (ref 0.0–15.0)
Seg Neut %: 46.8 %
WBC: 7.4 10*3/uL (ref 3.5–11.0)

## 2022-10-24 LAB — BASIC METABOLIC PANEL
Anion Gap: 13 (ref 7–16)
CO2: 23 mmol/L (ref 20–28)
Calcium: 9.2 mg/dL (ref 8.8–10.2)
Chloride: 105 mmol/L (ref 96–108)
Creatinine: 0.66 mg/dL (ref 0.51–0.95)
Glucose: 84 mg/dL (ref 60–99)
Lab: 6 mg/dL (ref 6–20)
Potassium: 3.9 mmol/L (ref 3.3–5.1)
Sodium: 141 mmol/L (ref 133–145)
eGFR BY CREAT: 112 *

## 2022-10-24 LAB — HM HIV SCREENING OFFERED

## 2022-10-24 LAB — COVID/INFLUENZA A & B/RSV NAAT (PCR)
COVID-19 NAAT (PCR): NEGATIVE
Influenza A NAAT (PCR): POSITIVE — AB
Influenza B NAAT (PCR): NEGATIVE
RSV NAAT (PCR): NEGATIVE

## 2022-10-24 LAB — PERFORMING LAB

## 2022-10-24 MED ORDER — IPRATROPIUM-ALBUTEROL 0.5-2.5 MG/3ML IN SOLN *I*
3.0000 mL | Freq: Once | RESPIRATORY_TRACT | Status: AC
Start: 2022-10-24 — End: 2022-10-24
  Administered 2022-10-24: 3 mL via RESPIRATORY_TRACT
  Filled 2022-10-24: qty 3

## 2022-10-24 MED ORDER — PREDNISONE 20 MG PO TABS *I*
60.0000 mg | ORAL_TABLET | Freq: Once | ORAL | Status: AC
Start: 2022-10-24 — End: 2022-10-24
  Administered 2022-10-24: 60 mg via ORAL
  Filled 2022-10-24: qty 3

## 2022-10-24 NOTE — ED Triage Notes (Signed)
C/o fever and wheezing since Friday. Hx of asthma. Has been using nebs and inhalers.     Prehospital medications given: No

## 2022-10-24 NOTE — First Provider Contact (Signed)
ED First Provider Contact Note    Initial provider evaluation performed by me on 10/24/22 at 7:42 pm    Vital signs reviewed.    Assessment: 42 year old female with history of asthma presents with cough and wheezing.  Symptoms have been present for 3 days.    Orders placed:  XRAYS and viral swabs      Patient requires further evaluation.     Timoteo Ace, MD, 10/24/2022, 7:42 PM     Timoteo Ace, MD  10/24/22 1610

## 2022-10-24 NOTE — Telephone Encounter (Signed)
Pt has called the after hours line requesting a urgent call back,  she thinks she may have developed pneumonia.    Please contact patient - [phone]

## 2022-10-24 NOTE — Telephone Encounter (Signed)
After hours phone call:    Has been having fevers, nasal congestion, and productive cough (yellow sputum) since Friday AM - her children were sick (influenza A) the week before. She is having difficulty with mucous clearing.     Later Friday she started having audible expiratory wheezing 10/20/22. No chest tightness. Overall symptoms are the same -the only aspect that has improved is her fevers.     She has been taking tylenol and motrin.     She's been taking symbicort BID 2 puffs, albuterol once per day, and levalbuterol + symbicort once yesterday and today.  On the phone she is talking in sentences, however, stopped to cough occasionally.    She has a history of recent diagnosis of persistent asthma this January 2024.  Her disease onset began in November at which point she had status asthmaticus requiring IV Solu-Medrol, IV magnesium, and around-the-clock DuoNebs as well as short-term BiPAP therapy.    Due to the history of severe asthma and status asthmaticus, I explained that I do believe she should present to the emergency room for around-the-clock nebulizer therapy, systemic steroids, chest x-ray, blood work, and monitoring.  We discussed an alternative plan of as needed nebulizers every 4 hours and 40 mg of prednisone, however, I stressed that I would feel more comfortable if she had hospital-based management especially given her history of status asthmaticus which developed very suddenly.     She agreed to go to [facility].  I instructed her to take her as needed albuterol every 2-4 hours as needed and to bring all of her medications with her to the hospital.  She will drive herself to [facility].    Gwen Her, MD  Internal Medicine PGY-3

## 2022-10-25 ENCOUNTER — Ambulatory Visit: Payer: PRIVATE HEALTH INSURANCE

## 2022-10-25 ENCOUNTER — Encounter: Payer: Self-pay | Admitting: Emergency Medicine

## 2022-10-25 ENCOUNTER — Telehealth: Payer: Self-pay | Admitting: Internal Medicine

## 2022-10-25 MED ORDER — BUDESONIDE-FORMOTEROL FUMARATE 160-4.5 MCG/ACT IN AERO *I*
2.0000 | INHALATION_SPRAY | Freq: Two times a day (BID) | RESPIRATORY_TRACT | 3 refills | Status: DC
Start: 2022-10-25 — End: 2024-07-02

## 2022-10-25 MED ORDER — BUDESONIDE 0.25 MG/2ML IN SUSP *I*
0.2500 mg | Freq: Two times a day (BID) | RESPIRATORY_TRACT | 0 refills | Status: DC
Start: 2022-10-25 — End: 2022-10-25

## 2022-10-25 MED ORDER — PREDNISONE 20 MG PO TABS *I*
40.0000 mg | ORAL_TABLET | Freq: Every day | ORAL | 0 refills | Status: DC
Start: 2022-10-25 — End: 2022-12-06

## 2022-10-25 MED ORDER — LEVALBUTEROL HCL 0.63 MG/3ML IN NEBU *I*
1.0000 | INHALATION_SOLUTION | Freq: Four times a day (QID) | RESPIRATORY_TRACT | 0 refills | Status: DC | PRN
Start: 2022-10-25 — End: 2022-11-02

## 2022-10-25 NOTE — Discharge Instructions (Addendum)
You were seen in the Emergency Department today for evaluation of wheezing. Based on our evaluation it appears that you are stable for discharge.      Laboratory blood work and imaging results indicate no pneumonia however you tested positive for influenza A. Take prednisone as prescribed to prevent asthma exacerbation/improve wheezing while you are sick with flu    It is important to obtain follow up with your primary care physician whenever you have been evaluated in the Emergency Department. Please call your doctor's office and schedule an appointment.     Return to the Emergency Department if your symptoms persist or worsen - especially if you experience chest pain, trouble breathing.     Please return if you have any concerns and are unable to contact your PCP.     Thank you for allowing me to care for you today. Hope that you feel better soon.

## 2022-10-25 NOTE — Telephone Encounter (Signed)
TC to pt to follow up on ED for flu.  She is feeling improved with nebs and inhalers.  Chest xray was normal.  Prednisone helping.  Advised her to call sooner if things worsen.  She has follow up appt next week.  She is aware she cannot see 2 pcps as she was seeing dr Chales Abrahams at [facility] but she informs me he has since retired.

## 2022-10-25 NOTE — ED Notes (Signed)
Patient understanding of discharge plan and has no further questions at this time. PIV removed by provider and patient ambulatory at discharge.

## 2022-10-25 NOTE — Telephone Encounter (Signed)
Reviewed pt chart, and per advisement of PCP's after hours, pt was seen last evening at [facility] ED. Tested positive for Influenza-A.  Taking prednisone 40 mg x 4 days, Symbicort 2 puffs twice/day, levalbuterol via neb prn.  Has ED follow up appointment with PCP next week 2/5.

## 2022-10-25 NOTE — Telephone Encounter (Signed)
Pt advised by PCP after hours to go to ED. See [facility] ED encounter 10/24/22.

## 2022-10-25 NOTE — Telephone Encounter (Signed)
Pt. was seen at ED, dx with Flu A.

## 2022-10-25 NOTE — ED Provider Notes (Signed)
History     Chief Complaint   Patient presents with    Fever     42 year old female with PMH of asthma presents to the ED for evaluation of wheezing.    Patient reports that for the past 4 days she has been experiencing wheezing and mild shortness of breath.  She states she been experiencing fevers at home as well.  Has been using her nebulizers and albuterol at home with minimal relief.    Denies cough.      History provided by:  Patient  Language interpreter used: No          Medical/Surgical/Family History     Past Medical History:   Diagnosis Date    Obesity     Pre-diabetes     Sickle cell trait         Patient Active Problem List   Diagnosis Code    Chronic midline low back pain without sciatica M54.50, G89.29    Class 1 obesity E66.9    Pre-diabetes R73.03            Past Surgical History:   Procedure Laterality Date    abdominoplasty      liposuction & abdominoplasty c/b cellulitis (2020)          Social History     Tobacco Use    Smoking status: Never    Smokeless tobacco: Never   Substance Use Topics    Alcohol use: Not Currently    Drug use: Not Currently             Review of Systems    Physical Exam     Triage Vitals  Triage Start: Start, (10/24/22 1939)  First Recorded BP: (!) 137/94, Resp: 18, Temp: 36.3 C (97.3 F), Temp src: Infrared Oxygen Therapy SpO2: 100 %, Oximetry Source: Lt Hand, O2 Device: None (Room air), Heart Rate: 100, (10/24/22 1939)  .  First Pain Reported  0-10 Scale: 3, Pain Location/Orientation: Throat, (10/24/22 1941)       Physical Exam  Vitals and nursing note reviewed.   Constitutional:       Appearance: She is well-developed.   HENT:      Head: Normocephalic and atraumatic.      Right Ear: External ear normal.      Left Ear: External ear normal.      Nose: Nose normal.      Mouth/Throat:      Pharynx: No oropharyngeal exudate.   Eyes:      Conjunctiva/sclera: Conjunctivae normal.      Pupils: Pupils are equal, round, and reactive to light.   Cardiovascular:      Rate and  Rhythm: Normal rate and regular rhythm.      Heart sounds: Normal heart sounds. No murmur heard.     Comments: Radial pulse 2+ bilaterally  Pulmonary:      Effort: Pulmonary effort is normal. No respiratory distress.      Breath sounds: Wheezing present. No rales.      Comments: Scattered expiratory wheezing all lung fields.  No excessive muscle use.  No tachypnea.  Chest:      Chest wall: No tenderness.   Abdominal:      General: There is no distension.      Palpations: Abdomen is soft.      Tenderness: There is no abdominal tenderness.   Musculoskeletal:         General: No deformity. Normal range of motion.  Cervical back: Normal range of motion.   Skin:     General: Skin is warm and dry.      Coloration: Skin is not pale.      Findings: No erythema or rash.   Neurological:      Mental Status: She is alert and oriented to person, place, and time.         Medical Decision Making   Patient seen by me on:  10/24/2022    Assessment:  42 year old female with PMH of asthma comes ED with wheezing, dyspnea and subjective fevers x 4 days.    Differential diagnosis:  Asthma exacerbation, URI, bronchitis, viral syndrome    Plan:  Orders Placed This Encounter      COVID/Influenza A & B/RSV NAAT (PCR)      *Chest standard frontal and lateral views      CBC and differential      Basic metabolic panel      Performing Lab      Initiate COVID precautions      Initiate droplet isolation      HM HIV SCREENING OFFERED    Medications  ipratropium-albuterol (DUONEB) 0.5-2.5mg  /52mL nebulization solution 3 mL (3 mLs Nebulization Given 10/24/22 2145)  predniSONE (DELTASONE) tablet 60 mg (60 mg Oral Given 10/24/22 2144)      ED Course and Disposition:  CXR negative.  Labs okay.  Viral panel positive for influenza A.  Patient received DuoNeb and is feeling much improved.  Offered to provide additional DuoNeb however she declines this.  Patient discharged home with prescription for prednisone, I refilled her level levalbuterol and  budesonide as well.    Patient updated of their results, they are agreeable with plan for discharge and follow up with their PCP. Return precautions discussed and they will return to the ED if their condition worsens.      Labs Reviewed  COVID/INFLUENZA A & B/RSV NAAT (PCR) - Abnormal; Notable for the following components:     Influenza A NAAT (PCR)        POSITIVE (*)            All other components within normal limits  CBC AND DIFFERENTIAL  BASIC METABOLIC PANEL  PERFORMING LAB  *Chest standard frontal and lateral views   Final Result        No evidence of acute disease in the chest.        END OF IMPRESSION                      [facility] submits this DICOM format image data and final report to the [facility], an independent secure electronic health information exchange, on a reciprocally searchable basis (with patient authorization) for a minimum of 12 months after exam     date.                   Madalyn Rob, PA            Madalyn Rob, [place]  10/25/22 [phone]

## 2022-10-30 ENCOUNTER — Ambulatory Visit: Payer: PRIVATE HEALTH INSURANCE | Admitting: Internal Medicine

## 2022-10-30 ENCOUNTER — Other Ambulatory Visit: Payer: Self-pay

## 2022-10-30 VITALS — BP 118/80 | HR 78 | Ht 64.0 in | Wt 214.0 lb

## 2022-10-30 DIAGNOSIS — H93A1 Pulsatile tinnitus, right ear: Secondary | ICD-10-CM

## 2022-10-30 DIAGNOSIS — J4540 Moderate persistent asthma, uncomplicated: Secondary | ICD-10-CM

## 2022-10-30 DIAGNOSIS — E669 Obesity, unspecified: Secondary | ICD-10-CM

## 2022-10-30 NOTE — Progress Notes (Signed)
[facility] NOTE    Past Medical History: has Chronic midline low back pain without sciatica; Class 1 obesity; and Pre-diabetes on their problem list.      Medications:   Current Outpatient Medications   Medication Sig    levalbuterol (XOPENEX) 0.63 mg/61mL nebulizer solution Inhale 3 mLs (0.63 mg total) by nebulization every 6 hours as needed for Wheezing    predniSONE (DELTASONE) 20 mg tablet Take 2 tablets (40 mg total) by mouth daily    budesonide-formoterol (SYMBICORT) 160-4.5 MCG/ACT inhaler Inhale 2 puffs into the lungs 2 times daily  Shake well before each use.    montelukast (SINGULAIR) 10 mg tablet Take 1 tablet (10 mg total) by mouth nightly    albuterol HFA (PROVENTIL, VENTOLIN, PROAIR HFA) 108 (90 Base) MCG/ACT inhaler Inhale 1-2 puffs into the lungs every 6 hours as needed for Wheezing  Shake well before each use.    generic DME Sleep Study    mupirocin (BACTROBAN) 2 % ointment Apply topically 3 times daily  for Wound Infection to the following areas: abdomen    fluocinolone 0.01% scalp (DERMA-SMOOTHE/FS) 0.01 % OIL external oil Apply topically nightly as needed  to the following areas: scaklp    ketoconazole (NIZORAL) 2 % shampoo Apply topically once a week  to the following areas: scalp    olopatadine (PATADAY) 0.2 % ophthalmic solution Place 1 drop into both eyes daily    meloxicam (MOBIC) 7.5 mg tablet Take 1 tablet (7.5 mg total) by mouth daily     No current facility-administered medications for this visit.        Allergies:   Allergies   Allergen Reactions    Shellfish-Derived Products Anaphylaxis    Cinnamon Itching       HPI/ROS:     Sella Buys is a 42 yo F recently diagnosed asthma (hospitalized for status asthmaticus, 08/17/22-08/19/22), class 2 obesity presenting close follow-up.    - Last seen on 10/04/21.   - Pulm NPV (10/10/22) Referred to [facility] to make sure symptoms not triggered by amoxicillin.  Same-day PFTs: FEV1 107% predicted value, FEV1/FVC 84.  - ED visit (10/24/22-10/25/22) for  influenza causing ashtma exacerbation.  Had begun to have symptoms of the influenza on 01/26, sent MyChart & then called after-hours line on 01/30.    Nikkii is on her last day of the prednisone because she skipped a day, prednisone makes her feel jittery and weak.  Currently using the Symbicort two puffs twice daily.  Currently using the albuterol/levalbuterol twice a week or so.    Has been taking the montelukast every evening.  Hasn't noticed a change in the wheezing.  The rushing in her ear lessened a little with the montelukast, and then worsened with the influenza.    Still having cough/wheezing, milder now.    Hasn't felt a fever recently.    OBJECTIVE:      Vitals:    10/30/22 0929   BP: 118/80   BP Location: Left arm   Patient Position: Sitting   Cuff Size: large adult   Pulse: 78   SpO2: 100%   Weight: 97.1 kg (214 lb)   Height: 1.626 m (5\' 4" )       PHYSICAL EXAM:  Constitutional: Pleasant, comfortable, NAD  ENT: Otoscopic exam benign, no erythema or exudate behind TMs.  CV: RRR, no murmur, no carotid bruit, no LE edema  Pulm: Normal WOB, diffuse expiratory wheezing bilaterally, no crackles    No results found for this or any  previous visit (from the past 24 hour(s)).      ASSESSMENT/PLAN:     Sakora Conca is a 42 yo F recently diagnosed asthma (hospitalized for status asthmaticus, 08/17/22-08/19/22), class 2 obesity presenting close follow-up.    Reports pulsatile tinnitus of right ear.  Normal ENT and neuro exam at last office visit.  Had mild improvement with montelukast, worsening when influenza recurred.  Discussed increased antihistamine.  Referred ENT.    Given atypical PFTs, plan to refer to ENT to eval for paradoxical vocal cord dysfunction.    Previously discussed bariatric surgery but held off on bariatric surgery eval after newly diagnosed status asthmaticus.  Now breathing symptoms well controlled, will complete bariatric surgery eval.    Moderate persistent asthma with recent status  asthmaticus, with recent exacerbation from influenza  -     Continue albuterol 1-2 puffs every 4-6 hrs as needed.  (Attempted to prescribe levalbuterol given prior palpitations with albuterol, but PA was not approved)  - Continue Symbicort 160-4.5 two puffs BID  - Continue montelukast 10 mg nightly  - Begin fexofenadine every morning  -     Continue pulm follow-up  - Refer to ENT given atypical PFTs to eval for paradoxical vocal cord dysfunction    Pulsatile tinnitus of right ear  -     AMB REFERRAL TO OTOLARYNGOLOGY  - Begin fexofenadine every morning. Continue montelukast nightly.    Class 2 Obesity, concern for OSA (snoring), with recent weight gain  -     [facility]  - TSH 0.20, free T4 0.8 (08/17/22).  Normal TSH &  free T4 (10/04/22)  - HgA1c 6.2 (08/17/22)    Health Maintenance:  Cancer Screening  - Benign Pap, no HR-HPV (12/12/21)  - Normal mammogram (09/11/22)  Vaccinations  - Covid-19 bivalent on 10/31/21; discussed Updated Covid-19 vaccine  - Seasonal influenza, 10/04/22  - Tdap 05/17/17    I spent 37 minutes in the room with the patient and on pre-/post-visit clinical work on the calendar day.    Follow up: 2 mo, earlier prn    ______________________  Glade Stanford MD

## 2022-10-30 NOTE — Patient Instructions (Signed)
-   Begin Allegra every morning.  Continue the Singulair (montelukast) every evening.  - You should get a call from ENT.

## 2022-11-01 ENCOUNTER — Telehealth: Payer: Self-pay | Admitting: Internal Medicine

## 2022-11-01 ENCOUNTER — Telehealth: Payer: Self-pay | Admitting: Pulmonology

## 2022-11-01 NOTE — Telephone Encounter (Signed)
All set!

## 2022-11-01 NOTE — Telephone Encounter (Signed)
Referral comment forwarded to NP & Nurse.    Dr. Vella Redhead out of the office.

## 2022-11-01 NOTE — Telephone Encounter (Signed)
Call from pt to sched appt from eReferral. Referral is locked ,so couldn't leave a note there. Theresa Bullock reviewed ref. Pt is sched at [facility] for tinnitus. But she needs to be seen by laryngologist at [facility] for vocal cord dysfunction.    Please call pt [phone]

## 2022-11-01 NOTE — Telephone Encounter (Unsigned)
Copied from CRM [id number]. Topic: Access to Care - Speak to Provider/Office Staff  >> Nov 01, 2022 10:59 AM Wyvonnia Dusky wrote:  The patient is requesting to speak with staff. Patient stated that this is regarding her referral to [facility]. She states that she was told they would close her referral and not schedule her because they are missing information from pulmonary. She is asking that our office reach out to them at [phone] to inquire what needs to be provided so that she can be seen.    Please call the patient back at [phone] to discuss.

## 2022-11-02 ENCOUNTER — Other Ambulatory Visit: Payer: Self-pay | Admitting: Internal Medicine

## 2022-11-02 ENCOUNTER — Telehealth: Payer: Self-pay | Admitting: Pulmonology

## 2022-11-02 NOTE — Telephone Encounter (Signed)
Can you let her know that they just had a question that was answered and their office will contact her with an appointment.

## 2022-11-02 NOTE — Telephone Encounter (Signed)
Copied from CRM #[id number]. Topic: Access to Care - Medical Records Request  >> Nov 02, 2022  9:24 AM Gifford Shave wrote:  Theresa Bullock called Immunology and was told her referral was missing some information from [place]; She stated she saw two doctors including Dr. Vella Redhead and asked if someone from our office could call her back to verify whatever was missing previously has been fixed and updated for her. They told her if it's not fixed right away they will just close the referral. She can be reached at [phone] (home)

## 2022-11-02 NOTE — Telephone Encounter (Signed)
Recent Visits  Date Type Provider Dept   10/30/22 Office Visit Arden, Grayland Jack, MD [facility] Internal Med   10/04/22 Office Visit Arden, Grayland Jack, MD [facility] Internal Med   08/29/22 Office Visit Arden, Grayland Jack, MD [facility] Internal Med   08/16/22 Office Visit Arden, Grayland Jack, MD [facility] Internal Med   07/14/22 Office Visit Sousou, Enrique Sack, NP [facility] Internal Med   12/12/21 Office Visit Arden, Grayland Jack, MD [facility] P[facility] Internal Med   10/13/21 Office Visit Arden, Grayland Jack, MD [facility] Pob Internal Med   08/15/21 Office Visit Arden, Grayland Jack, MD [facility] P[facility] Internal Med   Showing recent visits within past 540 days with a meds authorizing provider and meeting all other requirements  Future Appointments  Date Type Provider Dept   01/04/23 Appointment Arden, Grayland Jack, MD [facility] P[facility] Internal Med   Showing future appointments within next 150 days with a meds authorizing provider and meeting all other requirements

## 2022-11-02 NOTE — Telephone Encounter (Signed)
Called patient to inform her that the question regarding her referral was answered and that the Immunology office would contact her to schedule an appointment. She verbalized understanding.

## 2022-11-03 NOTE — Telephone Encounter (Signed)
MM documented in the referral and sent to allergy.  Please see referral.

## 2022-11-05 ENCOUNTER — Other Ambulatory Visit: Payer: Self-pay | Admitting: Internal Medicine

## 2022-11-06 ENCOUNTER — Other Ambulatory Visit: Payer: Self-pay

## 2022-11-06 ENCOUNTER — Ambulatory Visit: Payer: PRIVATE HEALTH INSURANCE | Attending: Internal Medicine

## 2022-11-06 DIAGNOSIS — H93A1 Pulsatile tinnitus, right ear: Secondary | ICD-10-CM | POA: Insufficient documentation

## 2022-11-06 NOTE — Progress Notes (Signed)
AUDIOLOGIC EVALUATION     [facility]  Part of [facility]   [address]                                                    [place] [address]  Phone: [phone], Fax: [phone]     Outpatient Visit  Patient: Theresa Bullock   MR Number: [id number]   Date of Birth: 1981/03/17   Date of Visit: 11/06/2022      PURE-TONE TEST RESULTS  Type of Testing: conventional      Test Reliability: good  Transducer: headphone  Booth: [place] 1  ANSI S3.21.2004 [id number])     Air Conduction Testing (dB HL and kHz)     LEFT EAR RIGHT EAR     0.125 0.25 0.50  0.75 1.0 1.5 2.0 3.0 4.0 6.0 8.0  0.125 0.25 0.50 0.75 1.0 1.5 2.0 3.0 4.0 6.0 8.0     15 5   5   5 5 5 10 5      15 10   5   10  0 0 15 5                                                                    Effective Masking Level                                                      Bone Conduction Testing (dB HL and kHz)  Bone conduction was unmasked/unspecified      0.25 0.50  0.75 1.0 1.5 2.0 3.0 4.0     0.25 0.50 0.75 1.0 1.5 2.0 3.0 4.0                          5 10   10   10    0                                                                      Effective Masking Level                                               SPEECH AUDIOMETRY     SAT SRT Score dB HL/MCL EML  Test  SAT SRT Score dB HL/MCL EML  Test       100 % 50 20 W-22 CD      96 % 50 20 W-22 CD   EML   EML  EML   EML               Please refer to scanned [facility] [id number] MR for additional information     Notes  Threshold in dB HL  Frequency in kiloHertz (kHz) Legend   dB=decibels  HL=Hearing Level  NR=No Response  VT=Vibro-Tactile EML=Effective Masking Level  SAT=Speech Awareness Threshold  SRT=Speech Reception  Threshold  MCL=Most Comfortable Loudness Level  MLV=Monitored Live Voice  CD=Compact Disk       HISTORY: Theresa Bullock was seen today for an audiological evaluation.  Patient reports pulsatile tinnitus in the right ear only that has been ongoing for over 6  months.  Tinnitus is constant, but is worse at night.  Patient feels that it is affecting her concentration and sleep.  She denies concern is for hearing at this time and also denies otalgia, aural fullness, and ear drainage.  She endorses spinning dizziness that has been ongoing since September.  Dizziness is episodic and lasts a few seconds, and occurs several times a day.  Patient reports that dizziness occurs more often at night.  Per patient, she was seen by her PCP recently, who noted middle ear effusion.    FINDINGS: Otoscopic examination revealed clear ear canals, bilaterally.  Pure-tone test results indicate normal hearing sensitivity, bilaterally.  Speech recognition ability in quiet was excellent, bilaterally, when speech was presented at an average conversational level.      ACOUSTIC IMMITTANCE:  TYMPANOMETRY:    Right Ear: Tested Left Ear: Tested   Frequency: 226 Hz  Frequency: 226 Hz   Canal Volume (ml): 0.9 Canal Volume (ml): 1.3   Static Compliance Peak (ml): 0.41 Static Compliance Peak (ml): 0.32   Peak Pressure (daPa): 15 Peak Pressure (daPa): 32   Gradient (daPa): 75 Gradient (daPa): 80      Normal middle ear pressure and tympanic membrane compliance in both ears, consistent with normal Eustachian tube function, bilaterally.     Results were reviewed with the patient.  She was briefly counseled regarding the nature of tinnitus and its management and rehabilitation strategies (e.g. relaxation and masking techniques).  Patient has an appointment scheduled with Dr. Marinda Elk at [facility] on 11/10/2022 for throat-related concerns, and an appointment with Dr. Doretha Sou at [facility] on 01/11/2023 for ear-related concerns.    RECOMMENDATIONS: Audiological re-evaluation as per ENT.    Meyer Russel, Au.D., CCC-A  Audiologist  [facility]

## 2022-11-10 ENCOUNTER — Encounter: Payer: Self-pay | Admitting: Otolaryngology

## 2022-11-10 ENCOUNTER — Ambulatory Visit: Payer: PRIVATE HEALTH INSURANCE | Admitting: Otolaryngology

## 2022-11-10 ENCOUNTER — Other Ambulatory Visit: Payer: Self-pay

## 2022-11-10 ENCOUNTER — Telehealth: Payer: Self-pay | Admitting: Otolaryngology

## 2022-11-10 VITALS — BP 142/75 | HR 83 | Temp 97.9°F | Wt 215.0 lb

## 2022-11-10 DIAGNOSIS — H93A9 Pulsatile tinnitus, unspecified ear: Secondary | ICD-10-CM

## 2022-11-10 DIAGNOSIS — R0600 Dyspnea, unspecified: Secondary | ICD-10-CM

## 2022-11-10 NOTE — Telephone Encounter (Signed)
Called the patient and LVM to schedule an appointment to be seen for tinnitus per Dr. Gershon Crane.

## 2022-11-10 NOTE — Progress Notes (Signed)
[facility]  New Patient    Chief Complaint: Shortness of breath    Patient ID: Theresa Bullock is a 42 y.o. female, unaccompanied    HPI  Theresa is seen today in consultation for primary symptoms of dyspnea.  She states that she has had difficulty breathing, especially feeling that on exhale that she notes significant restriction.  She also occasionally hears a high pitch whistling and wheezing on exhalation.  She denies any inspiratory stridor.  She also denies any throat tightness that accompanies this.  When she does have difficulty breathing, this last for several hours.  She does note improvement when using nebulizer treatments.  She has been working with pulmonology for her asthma.  She did have PFTs which did show a slight reduction in the inspiratory loop and she was referred for evaluation of possible paradoxical vocal fold motion.  She denies any issues with her voice other than when she is having dyspneic episodes, feeling that her voice becomes more "crackly" and "deep".  She does have occasional heartburn and takes omeprazole and Pepcid for this.  She has noted right-sided pulsatile tinnitus.  She is interested in having evaluation for this.  She has not had any recent imaging.    Dyspnea, pulsatile tinnitus on right    Theresa Bullock  has a past medical history of Anemia (1979-11-22), Asthma (08/17/22), Obesity, Pre-diabetes, and Sickle cell trait.    Past Surgical History:   Procedure Laterality Date    abdominoplasty      liposuction & abdominoplasty c/b cellulitis (2020)         Current Outpatient Medications:     levalbuterol (XOPENEX) 0.63 mg/55mL nebulizer solution, Inhale 3 mLs by nebulization every 6 hours as needed for Wheezing, Disp: 75 mL, Rfl: 0    predniSONE (DELTASONE) 20 mg tablet, Take 2 tablets (40 mg total) by mouth daily, Disp: 8 tablet, Rfl: 0    budesonide-formoterol (SYMBICORT) 160-4.5 MCG/ACT inhaler, Inhale 2 puffs into the lungs 2 times daily  Shake well  before each use., Disp: 3 each, Rfl: 3    montelukast (SINGULAIR) 10 mg tablet, Take 1 tablet (10 mg total) by mouth nightly, Disp: 90 tablet, Rfl: 3    albuterol HFA (PROVENTIL, VENTOLIN, PROAIR HFA) 108 (90 Base) MCG/ACT inhaler, Inhale 1-2 puffs into the lungs every 6 hours as needed for Wheezing  Shake well before each use., Disp: 1 each, Rfl: 5    generic DME, Sleep Study, Disp: 1 each, Rfl: 0    mupirocin (BACTROBAN) 2 % ointment, Apply topically 3 times daily  for Wound Infection to the following areas: abdomen, Disp: 22 g, Rfl: 0    fluocinolone 0.01% scalp (DERMA-SMOOTHE/FS) 0.01 % OIL external oil, Apply topically nightly as needed  to the following areas: scaklp, Disp: 120 mL, Rfl: 2    ketoconazole (NIZORAL) 2 % shampoo, Apply topically once a week  to the following areas: scalp, Disp: 120 mL, Rfl: 3    olopatadine (PATADAY) 0.2 % ophthalmic solution, Place 1 drop into both eyes daily, Disp: 2.5 mL, Rfl: 0    meloxicam (MOBIC) 7.5 mg tablet, Take 1 tablet (7.5 mg total) by mouth daily, Disp: 30 tablet, Rfl: 5     Allergies   Allergen Reactions    Shellfish-Derived Products Anaphylaxis    Cinnamon Itching        SOCIAL HISTORY:   Zip Code: [address]  Tobacco: None  Alcohol: None  H20 intake: 4 to 6 cups/day  Caffeine:  None  Theresa Bullock: No  Musician: No    Family History   Problem Relation Age of Onset    Diabetes Mother     Breast cancer Mother     Stroke Mother     Heart failure Mother     Diabetes Father     High Blood Pressure Father     Cataracts Father     Sickle cell trait Father     Anemia Father     High Blood Pressure Maternal Grandmother     Pancreatic Cancer Maternal Grandfather     Diabetes Paternal Grandmother     Breast cancer Maternal Aunt     Alzheimer's disease Maternal Aunt     Diabetes Maternal Uncle     Stroke Maternal Uncle     Autism Son     Colon cancer Neg Hx     Thyroid cancer Neg Hx         Review of Systems  10 systems were reviewed. Findings were negative except what is noted below  and/or in history of present illness.   VHI: 9         PHYSICAL EXAM:  Vitals:    11/10/22 1005   BP: 142/75   Pulse: 83   Temp: 36.6 C (97.9 F)   Weight: 97.5 kg (215 lb)        General:   Appropriate, well nourished, in no acute distress  Respiratory:   Breathing well, no stridor, no DOE, retractions  Cardiac: Extremities well perfused, no cyanosis  Exposure:   Good mouth opening, intact dentition  Voice:   Mild roughness, no strain, good projection  Face:   Normal symmetric facial nerve mobility, skin without lesions  Neck:   Supple, trachea midline, nontender to palpation, no LAD  Ears:   Auricles: without cutaneous lesions, EAC clear bilaterally, Tympanic membranes: well aerated w/o effusions bilaterally  Nose and Nasal Cavity: Ext: normal, healthy mucosa. Normal turbinates without mucopurulence   Oral Cavity: Healthy appearing mucosa. Lips without lesions, intact dentition.  Hard palate without lesions  Oropharynx: Healthy appearing mucosa without ulcerated or pedunculated lesions. Normal symmetrical soft palate elevation   Larynx: See below    Pertinent Labs / Radiology: None    ASSESSMENT  1. Dyspnea, unspecified type    2. Pulsatile tinnitus          PLAN  1) Videolaryngoscopy was performed today - please see the procedure note below.  2) Exam findings are reviewed with the patient at length.   3)  Theresa Bullock has dyspnea consistent with pulmonary etiologies.  Her history and exam findings are not consistent with paradoxical vocal fold motion.  I do recommend that she continue to work with pulmonology as well as await allergy testing results which she states that she recently had.  As for her pulsatile tinnitus, I do recommend that she undergo evaluation with otology, as they may wish to proceed with imaging.  I appreciate their evaluation.  She can plan to follow-up with me as needed.    All of the patient's questions are answered, and she agrees with this plan of care.    Marinda Elk,  MD        -  -  -  -  -  -  -  -  -  -  -  -  -  -  -  -  -  -  -  -  -  -  -  -  -  -  -  -  -  -  -  -  -  -  -  -  -  -  -  DEPARTMENT OF OTOLARYNGOLOGY    VIDEOLARYNGOSCOPY    DATE:  11/10/2022  PATIENT NAME:  Theresa Bullock  MRN: [id number]        Procedure:  Transnasal videolaryngoscopy, via right nare    Indications:  Dyspnea; need to evaluate laryngeal biomechanics.              Nasal Cavity:  Unremarkable         Nasopharynx:  Unremarkable, patent right ET orifice without masses/lesions        Oropharynx:  Unremarkable           Hypopharynx:  Unremarkable           Laryngeal Lesions: None         Vocal Fold Motion: Intact, no paradoxical motion         Laryngeal Closure: Complete         Supraglottic compression: Mild AP and false fold compression         Periarytenoid/Interarytenoid edema/erythema: Mild         Glottic secretions: Thin      The patient tolerated the examination well, without complications.

## 2022-11-20 ENCOUNTER — Other Ambulatory Visit: Payer: Self-pay

## 2022-11-20 ENCOUNTER — Ambulatory Visit: Payer: PRIVATE HEALTH INSURANCE | Admitting: Internal Medicine

## 2022-11-20 VITALS — BP 110/74 | HR 60 | Ht 64.0 in | Wt 216.0 lb

## 2022-11-20 DIAGNOSIS — Z111 Encounter for screening for respiratory tuberculosis: Secondary | ICD-10-CM

## 2022-11-20 DIAGNOSIS — J4540 Moderate persistent asthma, uncomplicated: Secondary | ICD-10-CM

## 2022-11-20 DIAGNOSIS — Z789 Other specified health status: Secondary | ICD-10-CM

## 2022-11-20 DIAGNOSIS — E669 Obesity, unspecified: Secondary | ICD-10-CM

## 2022-11-20 NOTE — Progress Notes (Signed)
[facility] NOTE    Past Medical History: has Chronic midline low back pain without sciatica; Class 1 obesity; and Pre-diabetes on their problem list.      Medications:   Current Outpatient Medications   Medication Sig    levalbuterol (XOPENEX) 0.63 mg/38m nebulizer solution Inhale 3 mLs by nebulization every 6 hours as needed for Wheezing    predniSONE (DELTASONE) 20 mg tablet Take 2 tablets (40 mg total) by mouth daily    budesonide-formoterol (SYMBICORT) 160-4.5 MCG/ACT inhaler Inhale 2 puffs into the lungs 2 times daily  Shake well before each use.    montelukast (SINGULAIR) 10 mg tablet Take 1 tablet (10 mg total) by mouth nightly    albuterol HFA (PROVENTIL, VENTOLIN, PROAIR HFA) 108 (90 Base) MCG/ACT inhaler Inhale 1-2 puffs into the lungs every 6 hours as needed for Wheezing  Shake well before each use.    generic DME Sleep Study    mupirocin (BACTROBAN) 2 % ointment Apply topically 3 times daily  for Wound Infection to the following areas: abdomen    fluocinolone 0.01% scalp (DERMA-SMOOTHE/FS) 0.01 % OIL external oil Apply topically nightly as needed  to the following areas: scaklp    ketoconazole (NIZORAL) 2 % shampoo Apply topically once a week  to the following areas: scalp    olopatadine (PATADAY) 0.2 % ophthalmic solution Place 1 drop into both eyes daily    meloxicam (MOBIC) 7.5 mg tablet Take 1 tablet (7.5 mg total) by mouth daily     No current facility-administered medications for this visit.        Allergies:   Allergies   Allergen Reactions    Shellfish-Derived Products Anaphylaxis    Cinnamon Itching       HPI/ROS:     Theresa Guerrerois a 42yo F recently diagnosed asthma (hospitalized for status asthmaticus, 08/17/22-08/19/22), class 2 obesity presenting for school physical.    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  Would be commuting to [employer], accepted to the program (3 year program), will be a student full time.  [facility], will be moving back by the end of the month.      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  [place]reated in [place]  Multiple negative Quantiferon Gold tests since then.  Did not receive the BCG vaccine in childhood.    OBJECTIVE:      Vitals:    11/20/22 1010   BP: 110/74   BP Location: Right arm   Patient Position: Sitting   Cuff Size: large adult   Pulse: 60   Weight: 98 kg (216 lb)   Height: 1.626 m ('5\' 4"'$ )     PHYSICAL EXAM:  Constitutional: Pleasant, comfortable, NAD  CV: RRR, no murmur, no LE edema  Pulm: Normal WOB, CTAB, no w/r/r  Abd: Soft, NT, ND, normoactive BS    No results found for this or any previous visit (from the past 24 hour(s)).      ASSESSMENT/PLAN:     JAshayla Latvalais a 42yo F recently diagnosed asthma (hospitalized for status asthmaticus, 08/17/22-08/19/22), class 2 obesity presenting for school physical (nursing school).  School Physical for Nursing School  - Normal physical exam, including normal pulmonary exam  -     Screening for TB, history of Latent TB, previously treated with an antibiotic (possibly INH): TB AG t-cell stimulation  - HBV immunity unknown, previously with negative HBSAg but per report had repeat vaccination after a blood exposure incident at work:  Hepatitis B profile  - Measles IgG, mumps IgG, rubella IgG, & varicella IgG positive  - Form for nursing school completed except for HBV immunity & TB screening.    Moderate persistent asthma with recent status asthmaticus  -     Continue albuterol 1-2 puffs every 4-6 hrs as needed  - Continue Symbicort 160-4.5 two puffs BID  - Continue montelukast 10 mg nightly  - Continue fexofenadine every morning  -     Continue pulm  follow-up  - ENT eval: No vocal cord dysfunction  -  Allergy eval sched for 12/07/22    Pulsatile tinnitus of right ear  -     Sched for ENT eval in Apr-2024  - Continue fexofenadine every morning, montelukast nightly.  (Previously had improved with montelukast initiation)    Class 2 Obesity, concern for OSA (snoring)  -     Sched for sleep med eval on 11/29/22  - Normal TSH &  free T4 (10/04/22)  - HgA1c 6.2 (08/17/22)    Health Maintenance:  Cancer Screening  - Benign Pap, no HR-HPV (12/12/21)  - Normal mammogram (09/11/22)  Vaccinations  - Covid-19 Updated 11/06/22  - Seasonal influenza, 11/06/22  - Tdap 05/17/17    I spent 32 minutes in the room with the patient and on pre-/post-visit clinical work on the calendar day.    Follow up: 2 mo as sched, earlier prn  ______________________  Clearence Cheek MD

## 2022-11-21 ENCOUNTER — Encounter: Payer: Self-pay | Admitting: Internal Medicine

## 2022-11-23 ENCOUNTER — Other Ambulatory Visit
Admission: RE | Admit: 2022-11-23 | Discharge: 2022-11-23 | Disposition: A | Discharge status: Home / Self Care | Payer: PRIVATE HEALTH INSURANCE | Source: Ambulatory Visit | Attending: Internal Medicine | Admitting: Internal Medicine

## 2022-11-23 DIAGNOSIS — Z111 Encounter for screening for respiratory tuberculosis: Secondary | ICD-10-CM | POA: Insufficient documentation

## 2022-11-23 DIAGNOSIS — Z789 Other specified health status: Secondary | ICD-10-CM

## 2022-11-24 LAB — HEPATITIS B PROF
HBV Core Ab: NEGATIVE
HBV S Ab Quant: 31.29 m[IU]/mL
HBV S Ab: POSITIVE
HBV S Ag: NEGATIVE

## 2022-11-27 LAB — TB AG T-CELL STIMULATION
TB AG 1: 0 IU/mL
TB AG 2: 0 IU/mL
TB AG T-Cell Stim: NEGATIVE IU/mL

## 2022-11-28 NOTE — Telephone Encounter (Signed)
Telephone Call:    Called when received TB antigen test back as student paperwork available.  Needed to know which antibiotic used for latent TB, patient states INH.  Updated vaccine record based on attachments from the above.    Let patient know paperwork available for pick-up.  Unfortunately, cannot fax to [employer] directly as there is an area that requires patient's signature.  (Will make copy for chart)    Clearence Cheek MD

## 2022-11-29 ENCOUNTER — Ambulatory Visit: Payer: PRIVATE HEALTH INSURANCE | Admitting: Physical Medicine and Rehabilitation

## 2022-12-05 ENCOUNTER — Ambulatory Visit: Payer: PRIVATE HEALTH INSURANCE | Admitting: Pulmonary Disease

## 2022-12-05 NOTE — Progress Notes (Addendum)
[facility] Follow-Up Note      Dear Dr. Fransico Setters,    I had the pleasure of seeing Theresa Bullock in follow-up today at the [facility] for asthma.  Theresa Bullock was last seen here on 10/10/2022 by Dr. Vella Redhead, NPV.     As you know, she is a 42 year old female with past medical history of obesity prediabetes sickle cell trait and asthma.  She is here for follow-up.    During the last visit, Dr. Vella Redhead noted that she was never diagnosed with asthma in the past and has no family history.  She also does not suffer from atopic dermatitis.  She does have a slightly elevated eosinophil count which a viral infection like RSV could have been provoking.  She was referred to allergy for testing in regards to her penicillin allergy.  Spirometry, bronchodilator response, DLCO and FENO were ordered.  For now she will continue Symbicort 2 puffs twice daily and albuterol as needed.    Her FeNO was completed on 10/10/2022 and is 47 ppb.  Her spirometry was normal, the bronchodilator challenge negative.  Her diffusing capacity is also normal.  There is no comparison on file.  Of note her inspiratory loop is slightly flattened and might indicate vocal cord dysfunction.     Her RAST testing completed 10/10/2022 shows a positive allergy to grass, trees, dog, cat (mild), dust (mild).  Her IgE is 97. Strongyloids AB negative.  1 year ago her eosinophils were 0.6, now they are 0.2.     Was seen by ENT and mostly normal eval.     Interm History       Today:  -recovering from a cold, it started last Sat-10 days ago-overall improving but continues with a "wheeze"  -has an appointment with allergist tomorrow as her RAST testing was positive for a few things  -currently even now has itchy/watery eyes, is congested nasally  -taking Allegra and fluticasone nasal spray  -is also taking Pepcid and eye drops   -on Singulair nightly  -is taking Symbicort 160, 2 p bid, albuterol as  needed  -has budesonide for nebulizer and also  albuterol for it-not used since last ED visit  -is questioning her inhaler refills, dosing    -does not like prednisone but she does feel better when on it   -respiratory infections trigger her breathing symptoms and foods can also trigger her breathing symptoms    Review of Systems   Cough: yes, is just getting over a cold, residual cough, dry mostly  Mucous: little, clear  Hemoptysis: Patient denies  Shortness of breath: denies   Chest tightness: Patient denies  Chest pain: Patient denies  Wheezing:yes, mild wheezing, does not use albuterol  -happens when she is still, at the end of exhalation with an URI  Nocturnal symptoms: Patient denies     Sinus/allergies/PND:  has a dog and RAST +  Loss of smell: Patient denies   Heartburn: controlled, rarely, takes Pepcid prn   Fevers: Patient denies  Chills/night sweats: Patient denies  Unwanted weight loss: Patient denies  Appetite: good  Exercise: is able to exercise, does not premedicate, one weekly-running x 15 min+   Edema: Patient denies  OSA/CPAP: is currently in the mids of getting fitted for a CPAP machine, has mild OSA-RRH Sleep medicine   Oxygen: no    Asthma Control Test:   In the past 4 weeks, how much of the time did your asthma keep you from getting as much  done at work, school, or home?: 3-Some of the time  During the past 4 weeks, how often have you had shortness of breath?: 4-Once or twice a week  During the past 4 weeks, how often did your asthma symptoms wake you up at night or earlier than usual in the morning?: 5-not at all  During the past 4 weeks, how often have you used your rescue inhaler or nebulizer medication(such as albuterol)?: 4-once a week or less  How would you rate your asthma control during the past 4 weeks?: 4-Well controlled  ACT SCORE 12 and OLDER: 20   (Score of 20 or higher represents good control.)       Urgent Care Visits: Denies r/t breathing issues since the last visit.   ED Visits / Hospitalizations: had a flare in Jan, had  influenza, received oral steroids   Last Prednisone use: 10/24/22, 08/19/22  Last antibiotic use: denies     Medications     Current Outpatient Medications   Medication    levalbuterol (XOPENEX) 0.63 mg/76mL nebulizer solution    predniSONE (DELTASONE) 20 mg tablet    budesonide-formoterol (SYMBICORT) 160-4.5 MCG/ACT inhaler    montelukast (SINGULAIR) 10 mg tablet    albuterol HFA (PROVENTIL, VENTOLIN, PROAIR HFA) 108 (90 Base) MCG/ACT inhaler    generic DME    mupirocin (BACTROBAN) 2 % ointment    fluocinolone 0.01% scalp (DERMA-SMOOTHE/FS) 0.01 % OIL external oil    ketoconazole (NIZORAL) 2 % shampoo    olopatadine (PATADAY) 0.2 % ophthalmic solution    meloxicam (MOBIC) 7.5 mg tablet     No current facility-administered medications for this visit.         A complete medication list was reviewed and updated with the patient.    Allergic reactions were reviewed and updated.    A ROS was performed and pertinent findings include those mentioned above.     Family History     Family History   Problem Relation Age of Onset    Diabetes Mother     Breast cancer Mother     Stroke Mother     Heart failure Mother     Diabetes Father     High Blood Pressure Father     Cataracts Father     Sickle cell trait Father     Anemia Father     High Blood Pressure Maternal Grandmother     Pancreatic Cancer Maternal Grandfather     Diabetes Paternal Grandmother     Breast cancer Maternal Aunt     Alzheimer's disease Maternal Aunt     Diabetes Maternal Uncle     Stroke Maternal Uncle     Autism Son     Colon cancer Neg Hx     Thyroid cancer Neg Hx          Social History        Pets: has a dog         Exam     Blood pressure 124/83, pulse 88, height 1.626 m (5\' 4" ), weight 98 kg (216 lb), SpO2 97%.    Gen:   Pleasant 42 y.o. female in NAD   Resp:  Mild wheezing , respirations unlabored   CV:    Regular S1S2,  no murmur,  no pedal edema   Msk: No cyanosis, no clubbing    Neuro: Alert and oriented X 3 without focal findings    Results      The following studies were personally reviewed and  discussed with the patient.     Her spirometry on October 10, 2022 was normal, the bronchodilator challenge negative.  Her diffusing capacity is also normal.  There is no comparison on file.  Of note her inspiratory loop is slightly flattened and might indicate vocal cord dysfunction.     FeNO: 47 ppb    Sleep Study: completed, has mild OSA and is currently being fitted for a CPAP machine    Imaging     The following studies were personally reviewed and discussed with the patient.      12/06/2022 chest x-ray       No acute cardiopulmonary disease.      END OF IMPRESSION       Labs         Lab results: 10/24/22  2247   WBC 7.4   Hemoglobin 14.5   Hematocrit 44   RBC 5.1   Platelets 270           Lab results: 10/24/22  2247 10/04/22  1400   Sodium 141 142   Potassium 3.9 4.5   Chloride 105 108   CO2 23 24   UN 6 7   Creatinine 0.66 0.71   Glucose 84 99   Calcium 9.2 9.2   Total Protein  --  6.4   Albumin  --  3.9   ALT  --  18   AST  --  19   Alk Phos  --  49   Bilirubin,Total  --  0.5           Latest Ref Rng & Units 10/10/2022    12:29 PM   AMB ALLERGY RAST REVIEW   Alternaria Tenuis IgE kU/L <0.10    A.fumigatus IgE kU/L <0.10    Cat Epi/Dander IgE kU/L 0.19    Cladosporium IgE kU/L <0.10    Cockroach,German IgE kU/L <0.10    DF Mites IgE kU/L 0.13    DP Mites IgE kU/L 0.12    Grass,[place] Blue IgE kU/L 2.54    Grass,Orchard IgE kU/L 1.92    IgE 0 - 158 kU/L 94    Lamb's Quarters IgE kU/L <0.10    Ragweed,Short IgE kU/L 3.21    Tree,White Ash IgE kU/L 0.37    Tree,Birch IgE kU/L 0.23    Tree,Box Elder/Maple IgE kU/L <0.10    Tree,Elm IgE kU/L 0.32    Tree, Oak IgE kU/L 3.43            Eosinophil Count:      10/24/22 01/23     EOS # K/ul 0.2 0.6         Active Problem List     Patient Active Problem List   Diagnosis Code    Chronic midline low back pain without sciatica M54.50, G89.29    Class 1 obesity E66.9    Pre-diabetes R73.03       Impression/Plan      Theresa Bullock is a 42 y.o. female  with a PMH significant for obesity and asthma here for follow-up of her asthma.  During her previous visit she states that she has never been diagnosed with asthma.  She has had nor

## 2022-12-06 ENCOUNTER — Encounter: Payer: Self-pay | Admitting: Pulmonary and Critical Care Medicine

## 2022-12-06 ENCOUNTER — Ambulatory Visit: Payer: PRIVATE HEALTH INSURANCE | Admitting: Pulmonary and Critical Care Medicine

## 2022-12-06 ENCOUNTER — Ambulatory Visit
Admission: RE | Admit: 2022-12-06 | Discharge: 2022-12-06 | Disposition: A | Discharge status: Home / Self Care | Payer: PRIVATE HEALTH INSURANCE | Source: Ambulatory Visit

## 2022-12-06 ENCOUNTER — Other Ambulatory Visit: Payer: Self-pay

## 2022-12-06 VITALS — BP 124/83 | HR 88 | Ht 64.0 in | Wt 216.0 lb

## 2022-12-06 DIAGNOSIS — R059 Cough, unspecified: Secondary | ICD-10-CM

## 2022-12-06 DIAGNOSIS — J4540 Moderate persistent asthma, uncomplicated: Secondary | ICD-10-CM

## 2022-12-06 DIAGNOSIS — R062 Wheezing: Secondary | ICD-10-CM

## 2022-12-06 MED ORDER — IPRATROPIUM-ALBUTEROL 0.5-2.5 MG/3ML IN SOLN *I*
3.0000 mL | Freq: Four times a day (QID) | RESPIRATORY_TRACT | 5 refills | Status: DC | PRN
Start: 2022-12-06 — End: 2023-01-14

## 2022-12-06 MED ORDER — PREDNISONE 20 MG PO TABS *I*
40.0000 mg | ORAL_TABLET | Freq: Every day | ORAL | 0 refills | Status: AC
Start: 2022-12-06 — End: 2022-12-11

## 2022-12-06 NOTE — Patient Instructions (Addendum)
-try nasal saline rinse/lavage  -no changes made to the inhalers, cont. With montelukast and all allergy medication   -was provided Duoneb in the nebulizer as needed 4 x daily when having a URI  -start prednisone 40 mg x 5 days for wheezing  -get chest xray     Saltwater washes (saline lavage or irrigation) help keep the nasal passages open by washing out thick or dried mucus. They can also help improve the function of cilia that help clear the sinuses . This can help stop an infection from spreading to the other sinuses and reduce post-nasal drip . It also can make the nose feel more comfortable by keeping the mucous membranes moist.    You can buy saline nose drops at a pharmacy. It's not recommended that you make your own saline solution. But if you do choose to make your own, follow these directions:    Make your saltwater solution.  Add 1 cup (250 mL) distilled water to a clean container.  If you use tap water, boil it first to sterilize it. Let it cool until it's lukewarm.    Add 0.5 tsp (2.5 g) salt to the water.  Add 0.5 tsp (2.5 g) baking soda.  Wash your sinuses.  Warm the solution a little, if you want.  But make sure it's not hot.    Fill a large medical syringe, squeeze bottle, or nasal cleansing pot (such as a Neti Pot) with the saline solution.  Insert the tip into your nostril, and squeeze gently.  Aim the stream of saline solution toward the back of your head, not toward the top.  The saline wash should go through the nose and out the mouth or the other side of the nose.  Blow your nose gently after the saline wash unless your doctor has told you not to blow your nose.  The saline wash may cause a burning feeling in your nose the first few times you use it. Most people get used to the wash after a few times.    Repeat several times every day.  Clean the syringe or bottle after each use.  You can store homemade saline solution at room temperature for 3 days.   VIEW YOUR HEALTH INFORMATION ONLINE  TODAY!    Below is your activation code to sign up for MyChart, [facility]'s free website that allows you to view most test results, send and receive messages from your doctor, renew your prescriptions, request an appointment, and much more!    How Do I Sign Up?    Important:  You must have an email address to sign up for MyChart.    Go to [email or web address]  Click on the Sign Up (I have a code) link under "New User?" located in the blue box on the left hand side of the screen.    Enter your MyChart Activation Code: Activation code not generated  Current [place] MyChart Status: Active  Enter your Date of Birth, Zip Code and Phone Number, and click NEXT.  Continue following the instructions to complete your MyChart sign up.    You also may want to write down your Username and Password below:    MY USERNAME:  _______________________________________________    [facility] PASSWORD: _______________________________________________    Additional Information    If you are not the patient, but you are involved in the health care of this patient, DO NOT USE THIS CODE!  Instead, go to the [employer] [email or web address]) and click on "Access  for My Kids/Family/Friends" below the username and password fields.    Questions?    Call our [place], 8 a.m. to 5 p.m.   [id number]), [phone]; choose Option 1

## 2022-12-07 ENCOUNTER — Encounter: Payer: Self-pay | Admitting: Allergy/Immunology/Rheumatology

## 2022-12-07 ENCOUNTER — Ambulatory Visit: Payer: PRIVATE HEALTH INSURANCE

## 2022-12-07 ENCOUNTER — Ambulatory Visit: Payer: PRIVATE HEALTH INSURANCE | Admitting: Allergy/Immunology/Rheumatology

## 2022-12-07 ENCOUNTER — Other Ambulatory Visit: Payer: Self-pay | Admitting: Allergy/Immunology/Rheumatology

## 2022-12-07 VITALS — BP 143/65 | HR 86 | Wt 216.0 lb

## 2022-12-07 DIAGNOSIS — T781XXA Other adverse food reactions, not elsewhere classified, initial encounter: Secondary | ICD-10-CM

## 2022-12-07 DIAGNOSIS — T50905A Adverse effect of unspecified drugs, medicaments and biological substances, initial encounter: Secondary | ICD-10-CM

## 2022-12-07 DIAGNOSIS — Z91018 Allergy to other foods: Secondary | ICD-10-CM

## 2022-12-07 DIAGNOSIS — J45909 Unspecified asthma, uncomplicated: Secondary | ICD-10-CM

## 2022-12-07 DIAGNOSIS — J309 Allergic rhinitis, unspecified: Secondary | ICD-10-CM

## 2022-12-07 DIAGNOSIS — J4541 Moderate persistent asthma with (acute) exacerbation: Secondary | ICD-10-CM

## 2022-12-07 MED ORDER — EPINEPHRINE 0.3 MG/0.3ML IJ SOAJ *I*
0.3000 mg | INTRAMUSCULAR | 2 refills | Status: DC | PRN
Start: 2022-12-07 — End: 2024-07-21

## 2022-12-07 NOTE — Patient Instructions (Addendum)
Pollen seasons in [place]:   -- Trees - March -May  -- Grasses - mid-May - early July  -- Weeds - mid-August - frost      For allergies:   -- Plan for skin testing to common environmental allergens this spring once your asthma is better controlled. You will need to be off all antihistamines for testing - Allegra, Pepcid, olopatadine eye drops.   -- Continue Allegra daily, montelukast daily  -- Start taking Flonase 2 sprays in each nostril once daily.   -- Nasal spray instructions: Angle spray out towards your eye, don't place tip of spray too far into your nose, don't sniff.       For asthma:   -- Make sure to take Symbicort 2 puffs twice daily.   -- Rinse mouth after use.       For food allergy:   -- We'll do food allergy testing when we do your environmental testing.   -- For now continue to avoid crab, cinnamon, dairy, eggs, chocolate.  -- Keep 2 EpiPen devices available at all times.       For possible Augmentin allergy:  -- We'll do penicillin allergy skin testing at your next visit.   -- If the skin test is negative, we'll do a medication challenge to amoxicillin.

## 2022-12-07 NOTE — Progress Notes (Signed)
Allergy New Patient Consult Note    PRIMARY CARE PHYSICIAN:  Frances Nickels, MD   REFERRING PHYSICIAN: Beckey Downing, MD    CHIEF COMPLAINT:  Allergies    HPI:   Theresa Bullock is a 42 y.o. female with past medical history significant for sickle cell trait.  She is referred by her pulmonologist for the evaluation of a possible allergy to amoxicillin.     Theresa Bullock had a flu-like respiratory infection that started last fall in late October. She eventually developed a productive cough and was prescribed Augmentin. She took two doses - one at 5pm and the second at midnight on 08/17/22. Her breathing symptoms became progressively worse, to the point that she felt like she couldn't exhale. EMS was called and she was given epinephrine. She was taken to [facility] where she was admitted from 11/23-25 for status asthmaticus. She was on BiPAP, but was not intubated in the hospital. She was sent home on Symbicort and since mid-January has been followed by pulmonary (Dr. Vella Redhead & Marilynn Rail, FNP.) There is a question of possible anaphylaxis to amoxicillin/Augmentin given the timing between starting the antibiotic and the acute worsening of her symptoms. She had only taken Augmentin and albuterol that day. No NSAID use that day. She tolerates NSAIDs well. She had taken Augmentin prior to this and tolerated it well. She has not been on any antibiotics since.     Theresa Bullock has no history of asthma prior to her hospitalization in November. Since that time she has had wheezing and chest congestion with colds. She had a cold that started 10-11 days ago. The upper respiratory symptoms are resolved, but she has lingering wheezing and chest congestion. She was seen at her pulmonologist's office yesterday where she was started on prednisone 40mg  daily x5 days for wheezing. A chest x-ray was obtained and was normal. She is feeling somewhat better after starting the prednisone. She is not wheezing as much and the chest congestion has  improved. Duoneb was also prescribed yesterday, but she hasn't taken it yet. She continues on Symbicort, but admits to taking 2 puffs once daily. She doesn't like steroid medications, and notes weight gain since starting on medications that contain steroids.     Theresa Bullock has a history of allergic rhinitis. She has symptoms in the springtime. Typical symptoms include itchy eyes, sneezing, itchy nose, and sinus headaches. Symptoms have been present in recent weeks. She takes fexofenadine 180mg  daily and montelukast 10mg  nightly. She adds Flonase and olopatadine eye drops when symptoms are bothersome. She used to take Pepcid in the past to help with allergies in general.     She had allergy testing done when she lived in [place] more than 15 years ago. She thinks that she might have had environmental allergy testing done, but doesn't remember the results. She remembers that they also did food allergy testing and that she was found to be positive to strawberries and possibly other foods. At the time she wasn't having symptoms to suggest food allergy, so she's not sure why the food allergy testing was done. She has continued to eat strawberries and fresh fruits and vegetables and has not had any problems. She was never on allergy shots.     Her pulmonologist obtained blood work in January to screen for allergies. Blood work was positive to dog, orchard grass, [place] blue grass, ragweed, oak tree, and white ash tree. Borderline to cat, dust mites, birch tree, and elm tree. Her total IgE was 94.  She has one dog, a husky, at home. She has had the dog for 10 years and hasn't noticed any allergy or respiratory symptoms with dog exposure. The dog has not slept in her bed for over 6 years.     Theresa Bullock reports a history of eosinophilia that was first noted sometime between 2005-2010. She doesn't remember what her eosinophil count was at the time. She was asymptomatic so further workup wasn't pursued. Eosinophilia was noted in  her CBC in November 2022 (eos 0.6), but most recently her CBC was normal in January 2024 (eos 0.2).     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 She had repeated episodes of headache, tongue swelling (to the point of drooling), and dizziness to the point of starting to black out associated with eating crab. Symptoms start immediately upon ingestion. She tolerates other shellfish, including shrimp, lobster, and crawfish. She has been avoiding crab. No recent exposures.   -- Her reaction to cinnamon is tingling in her tongue and a headache. She has been avoiding cinnamon.   -- She questions possible allergies to dairy (milk and cheese), eggs, and chocolate. Since her severe asthma exacerbation, she has been wheezing after eating these foods. The wheezing is mild, but noticeable. It seems to happen consistently. No hives or angioedema.   -- Kiwi - she has itching on her skin, but no rash, when she eats kiwi.   -- She also has GERD that is possibly not well controlled depending on what and what time she eats.   -- She has an old (likely expired) epinephrine autoinjector at home.       She mentions a history of psoriasis, but no history of atopic dermatitis.     She had ENT workup last month that was negative for PVFM.       CURRENT MEDICATIONS:   Current Outpatient Medications   Medication Sig Dispense Refill    predniSONE (DELTASONE) 20 mg tablet Take 2 tablets (40 mg total) by mouth daily for 5 days 10 tablet 0    budesonide-formoterol (SYMBICORT) 160-4.5 MCG/ACT inhaler Inhale 2 puffs into the lungs 2 times daily  Shake well before each use. 3 each 3    montelukast (SINGULAIR) 10 mg tablet Take 1 tablet (10 mg total) by mouth nightly 90 tablet 3    fluocinolone 0.01% scalp (DERMA-SMOOTHE/FS) 0.01 % OIL external oil Apply topically nightly as needed  to the following areas: scaklp 120 mL 2    ketoconazole (NIZORAL) 2 % shampoo Apply topically once a week  to the  following areas: scalp 120 mL 3    ipratropium-albuterol (DUONEB) 0.5-2.5mg  /47mL nebulizer solution Inhale 3 mLs by nebulization 4 times daily as needed for Wheezing 180 mL 5    levalbuterol (XOPENEX) 0.63 mg/41mL nebulizer solution Inhale 3 mLs by nebulization every 6 hours as needed for Wheezing 75 mL 0    albuterol HFA (PROVENTIL, VENTOLIN, PROAIR HFA) 108 (90 Base) MCG/ACT inhaler Inhale 1-2 puffs into the lungs every 6 hours as needed for Wheezing  Shake well before each use. 1 each 5    generic DME Sleep Study 1 each 0    mupirocin (BACTROBAN) 2 % ointment Apply topically 3 times daily  for Wound Infection to the following areas: abdomen 22 g 0    olopatadine (PATADAY) 0.2 % ophthalmic solution Place 1 drop into both eyes daily 2.5 mL 0    meloxicam (MOBIC) 7.5 mg  tablet Take 1 tablet (7.5 mg total) by mouth daily 30 tablet 5     No current facility-administered medications for this visit.       ALLERGIES:   Allergies   Allergen Reactions    Shellfish-Derived Products Anaphylaxis     crab    Cinnamon Itching       PAST MEDICAL HISTORY:   Past Medical History:   Diagnosis Date    Anemia 03/01/80    Sickle Cell Anemia Trait    Asthma 08/17/22    Obesity     Pre-diabetes     Sickle cell trait        PAST SURGICAL HISTORY:  Past Surgical History:   Procedure Laterality Date    abdominoplasty      liposuction & abdominoplasty c/b cellulitis (2020)       FAMILY HISTORY:   Family History   Problem Relation Age of Onset    Allergic rhinitis Mother     Diabetes Mother     Breast cancer Mother     Stroke Mother     Heart failure Mother     Diabetes Father     High Blood Pressure Father     Cataracts Father     Sickle cell trait Father     Anemia Father     Breast cancer Maternal Aunt     Alzheimer's disease Maternal Aunt     Diabetes Maternal Uncle     Stroke Maternal Uncle     High Blood Pressure Maternal Grandmother     Pancreatic Cancer Maternal Grandfather     Diabetes Paternal Grandmother     Autism Son     Colon  cancer Neg Hx     Thyroid cancer Neg Hx        SOCIAL HISTORY:   Theresa Bullock is a never smoker and there is not second hand smoke exposure in the home.  She lives with her husband and her kids (ages 70 and 48)

## 2022-12-13 ENCOUNTER — Ambulatory Visit: Payer: PRIVATE HEALTH INSURANCE | Attending: Pulmonary and Critical Care Medicine

## 2022-12-13 ENCOUNTER — Other Ambulatory Visit: Payer: Self-pay

## 2022-12-13 DIAGNOSIS — R062 Wheezing: Secondary | ICD-10-CM | POA: Insufficient documentation

## 2022-12-13 DIAGNOSIS — J4540 Moderate persistent asthma, uncomplicated: Secondary | ICD-10-CM | POA: Insufficient documentation

## 2022-12-14 LAB — PULMONARY FUNCTION TEST
FEV1 (L): 2.5 L
FEV1 Z-score: -0.11
FEV1/FVC Z-score: 0
FEV1/FVC: 82 %
FVC (L): 3.04 L
FVC Z-score: -0.16

## 2022-12-22 NOTE — Progress Notes (Signed)
Chief Complaint   Patient presents with    Tinnitus    Vertigo (Dizziness)     Subjective   History:    History of Present Illness:  She presents with concerns about pulsatile tinnitus. She has been seen by laryngology (Dr. Tobias Alexander, Feb 2024) for dypsnea. Today she reports pulsatile tinnitus on the right since August, worse at night. This is constant during the day. She also notes room-spinning without triggers, started in December (3 months). This lasts for less than a minute, no associated nausea. This seems to get worse with more activity. This occurs about 5 times per day. She denies hearing loss. She denies ear pain or drainage. She did have her ears flushed, notes irritation on the left with a home otoscope. She denies recent head or neck trauma. She has noticed that leaning forward makes the pulsing sound worse. The pulsing sound is described as synchronous with her heartbeat. She does not smoke. She has a h/o migraine. She also reports numerous new medications, including prednisone for asthma, topical medication for psoriasis.    Tinnitus  Affected ear:  Right  What is your goal for your visit:  Identify cause of Tinnitus    PMH significant for anemia, asthma, sickle cell trait    Review of Systems   HENT:  Positive for tinnitus.           Objective   Examination:    Vitals:    12/25/22 1023   BP: 128/74   Pulse: 82   Weight: 98.4 kg (217 lb)   Height: 1.626 m (5\' 4" )       Physical Exam:    General: Well appearing female in no acute distress  Voice: strong and clear  Ears: External ears normal in appearance; EACs patent; TMs normal in appearance, position, and color; middle ear space clear without effusion. TMs mobile with pneumatic otoscopy. AC > BC bilaterally. Weber midline. No bruits.  Nose: Nasal cavity with normal mucosa. Septum midline. Turbinates normal. No polyps or purulence.  Oral cavity and oropharynx: clear without masses or lesions. Dentition in adequate restoration. Tonsils  non-obstructing. Posterior oropharynx clear.  Neck: soft and supple without adenopathy or masses. Thyroid bed normal without thyromegaly or palpable nodules.  Neuro: CN III-XII intact and symmetric. No nystagmus. Head impulse test negative. Romberg negative. Tandem walk without difficulty. Dix-Hallpike negative.   Resp: Breathing unlabored and quiet.      Labs / Imaging / Studies  Audiogram: reviewed below      11/06/2022    10:00 AM   Conventional Audiogram   R 250 Hz 15   R 500 Hz 10   R 1000 Hz 5   R 2000 Hz 10   R 4000 Hz 0   R 8000 Hz 5   R Speech 96 %   L 250 Hz 15   L 500 Hz 5   L 1000 Hz 5   L 2000 Hz 5   L 4000 Hz 5   L 8000 Hz 5   L Speech 100 %   Normal tympanometry          Assessment     ICD-10-CM ICD-9-CM   1. Pulsatile tinnitus of right ear  H93.A1 388.30   2. Vertigo  R42 34.80       42 year old female with right pulsatile tinnitus and episodic vertigo. Ear exam is benign and there is no evidence of unilateral peripheral vestibular weakness on exam today. Recent audiogram  is also within normal limits. Recommend imaging to further evaluate pulsatile tinnitus. Suspect vertiginous migraine, but will await results of imaging. May consider VNG if MR is normal.     Plan   MRI/MRV/MRA   Return to clinic as needed    Meds and Orders Placed this Visit:  1. Pulsatile tinnitus of right ear  - MR head without and with contrast; Future  - MRA/MRV head without contrast; Future    2. Vertigo  - MR head without and with contrast; Future      Follow up if symptoms worsen or fail to improve.

## 2022-12-25 ENCOUNTER — Encounter: Payer: Self-pay | Admitting: Otolaryngology

## 2022-12-25 ENCOUNTER — Ambulatory Visit: Payer: PRIVATE HEALTH INSURANCE | Admitting: Otolaryngology

## 2022-12-25 ENCOUNTER — Telehealth: Payer: Self-pay | Admitting: Allergy and Immunology

## 2022-12-25 ENCOUNTER — Other Ambulatory Visit: Payer: Self-pay

## 2022-12-25 VITALS — BP 128/74 | HR 82 | Ht 64.0 in | Wt 217.0 lb

## 2022-12-25 DIAGNOSIS — H93A1 Pulsatile tinnitus, right ear: Secondary | ICD-10-CM

## 2022-12-25 DIAGNOSIS — R42 Dizziness and giddiness: Secondary | ICD-10-CM

## 2022-12-25 NOTE — Progress Notes (Signed)
Called pt with Radiology appt date & time as follows:    PROCEDURE:  MR head w/o & w/ contrast  MRA/MRV head w/o contast    DATE & TIME:  Tuesday 01/16/23 at 7:30 pm    LOCATION:  [facility]  [address] [place], [place], [place] [address]    Prior Testing/Lab work needed:  No      Instructions:  no      Prior Auth: Central to obtain      Message left-confirmation call requested Yes. LVM informing patient of imaging appointment.

## 2022-12-25 NOTE — Telephone Encounter (Signed)
TC to Pt. Reminding Pt. Of scheduled appt. On 01/04/23 @ 1:00PM at the [place] location.  Also, discontinue antihistamines 5 days prior to appt.

## 2023-01-04 ENCOUNTER — Ambulatory Visit: Payer: PRIVATE HEALTH INSURANCE | Admitting: Internal Medicine

## 2023-01-04 ENCOUNTER — Encounter: Payer: Self-pay | Admitting: Allergy/Immunology/Rheumatology

## 2023-01-04 ENCOUNTER — Ambulatory Visit: Payer: PRIVATE HEALTH INSURANCE | Admitting: Allergy/Immunology/Rheumatology

## 2023-01-04 ENCOUNTER — Encounter: Payer: Self-pay | Admitting: Gastroenterology

## 2023-01-04 ENCOUNTER — Ambulatory Visit: Payer: PRIVATE HEALTH INSURANCE

## 2023-01-04 ENCOUNTER — Other Ambulatory Visit: Payer: Self-pay

## 2023-01-04 VITALS — BP 133/84 | HR 71 | Temp 98.6°F

## 2023-01-04 VITALS — BP 118/86 | HR 64 | Ht 64.0 in | Wt 222.0 lb

## 2023-01-04 DIAGNOSIS — R7303 Prediabetes: Secondary | ICD-10-CM

## 2023-01-04 DIAGNOSIS — T50905A Adverse effect of unspecified drugs, medicaments and biological substances, initial encounter: Secondary | ICD-10-CM

## 2023-01-04 DIAGNOSIS — T781XXA Other adverse food reactions, not elsewhere classified, initial encounter: Secondary | ICD-10-CM

## 2023-01-04 DIAGNOSIS — J309 Allergic rhinitis, unspecified: Secondary | ICD-10-CM

## 2023-01-04 DIAGNOSIS — J4540 Moderate persistent asthma, uncomplicated: Secondary | ICD-10-CM

## 2023-01-04 DIAGNOSIS — Z91018 Allergy to other foods: Secondary | ICD-10-CM

## 2023-01-04 DIAGNOSIS — K219 Gastro-esophageal reflux disease without esophagitis: Secondary | ICD-10-CM

## 2023-01-04 DIAGNOSIS — J301 Allergic rhinitis due to pollen: Secondary | ICD-10-CM

## 2023-01-04 DIAGNOSIS — J45909 Unspecified asthma, uncomplicated: Secondary | ICD-10-CM

## 2023-01-04 DIAGNOSIS — G4733 Obstructive sleep apnea (adult) (pediatric): Secondary | ICD-10-CM

## 2023-01-04 DIAGNOSIS — Z6838 Body mass index (BMI) 38.0-38.9, adult: Secondary | ICD-10-CM

## 2023-01-04 MED ORDER — OMEPRAZOLE 40 MG PO CPDR *I*
40.0000 mg | DELAYED_RELEASE_CAPSULE | Freq: Every day | ORAL | 3 refills | Status: DC
Start: 2023-01-04 — End: 2023-12-11

## 2023-01-04 NOTE — Progress Notes (Signed)
Allergy Follow-Up Note    PRIMARY CARE PHYSICIAN: Frances Nickels, MD    HPI:   Theresa Bullock is a 42 y.o. female who is here for follow-up of asthma, allergic rhinitis, food allergies, and medication allergy.  I last saw Theresa Bullock one month ago. She is scheduled for skin testing to environmental allergens, foods, and penicillin today.    Breathing better than last visit. Started Flonase. No new concerns today.       I have reviewed, updated and confirmed the details of the past medical history, surgical history, immunization history, family history, and environmental history as previously detailed in the patient's chart.     CURRENT MEDICATIONS:   Current Outpatient Medications   Medication Sig    omeprazole (PRILOSEC) 40 mg capsule Take 1 capsule (40 mg total) by mouth daily    fexofenadine (ALLEGRA) 180 mg tablet Take 1 tablet (180 mg total) by mouth daily    EPINEPHrine (EPIPEN) 0.3 mg/0.3 mL auto-injector Inject 0.3 mLs (0.3 mg total) into the muscle as needed for Anaphylaxis    ipratropium-albuterol (DUONEB) 0.5-2.5mg  /56mL nebulizer solution Inhale 3 mLs by nebulization 4 times daily as needed for Wheezing    levalbuterol (XOPENEX) 0.63 mg/7mL nebulizer solution Inhale 3 mLs by nebulization every 6 hours as needed for Wheezing    budesonide-formoterol (SYMBICORT) 160-4.5 MCG/ACT inhaler Inhale 2 puffs into the lungs 2 times daily  Shake well before each use.    montelukast (SINGULAIR) 10 mg tablet Take 1 tablet (10 mg total) by mouth nightly    albuterol HFA (PROVENTIL, VENTOLIN, PROAIR HFA) 108 (90 Base) MCG/ACT inhaler Inhale 1-2 puffs into the lungs every 6 hours as needed for Wheezing  Shake well before each use.    generic DME Sleep Study (Patient not taking: Reported on 01/04/2023)    mupirocin (BACTROBAN) 2 % ointment Apply topically 3 times daily  for Wound Infection to the following areas: abdomen (Patient not taking: Reported on 01/04/2023)    fluocinolone 0.01% scalp (DERMA-SMOOTHE/FS) 0.01 % OIL  external oil Apply topically nightly as needed  to the following areas: scaklp    ketoconazole (NIZORAL) 2 % shampoo Apply topically once a week  to the following areas: scalp    olopatadine (PATADAY) 0.2 % ophthalmic solution Place 1 drop into both eyes daily    meloxicam (MOBIC) 7.5 mg tablet Take 1 tablet (7.5 mg total) by mouth daily        ALLERGIES:   Allergies   Allergen Reactions    Augmentin [Amoxicillin-Pot Clavulanate] Anaphylaxis     Possible anaphylaxis vs asthma exacerbation. In process of evaluating amoxicillin allergy in allergy clinic. Skin test/challenge pending.     Shellfish-Derived Products Anaphylaxis     Crab only. Tolerates shrimp, lobster, and crawfish.    Cinnamon Other (See Comments)     Tongue irritation        PHYSICAL EXAMINATION:  Vitals: BP 133/84   HR 71    Temp 37.0C    SpO2 96%    Constitutional: Alert, appears well, and in no acute distress  Eyes: No conjunctival erythema, no eyelid or periorbital edema  Cardiovascular: Normal S1-S2, no murmur or gallop or rub, regular rate and rhythm.   Respiratory: Clear to auscultation bilaterally, good air movement, breathing comfortably on room air   Skin: No visible rash.   Neurologic: Moving all extremities, mental status normal for age  Psychiatric: Normal affect for age      TESTING:   01/04/2023  2:00 PM  Allergy Aeroallergen Testing    Type of testing performed: Percutaneous    (+)   Histamine     10mg /ml     HS 7/15    (-)   0.9% NaCl/   0.03%Alb/   0.4% Phenol 0/0    Cat      10,000BAU     HS 0/0    Dog 1:20   HS 5/10    Mouse    1:20    G 0/0    D.f. Mite     10,000AU     G 0/0    D.p. Mite     10,000AU     G 10/35    Roach     1:20     G 0/0    Pine, white/Eastern     1:20     G 0/0    Elm, Chinese 1:20 HS 0/0    [place], [place].     1:20     HS 0/0    Poplar, cottonwood     1:20     G 0/0    Sugar Maple     1:20     G 0/0    Oak, white     1:20     G 10/40    Ash, white     1:20     G 5/20    Cedar, red     1:20     G 0/0    Willow,  black     1:20     G 0/0    Birch, red/river     1:20     G 4/9    Hickory, shagbark     1:20     G 3/5    Black Walnut     1:20     G 0/0    Sycamore     1:20     G 0/0    Grass Mix # 5     100,000BAU     G 10/30    [place] Grass     1:20     G 9/28    Sheep Sorrel     1:20     G 0/0    Plantain     1:20     G 4/6    Smooth Brome     1:20     G 7/35    Yellow Dock     1:20     G 0/0    Amaranths     1:20     G 5/20    Artemesia     1:20     G 0/0    Chenopodium     1:20     G 0/0    Cocklebur     1:20     G 0/0    Kochia     1:20     G 0/0    Ragweed     1:20     G 0/0    Alternaria Alternata   1:10    HS 0/0    Bipolaris Sorokiniana   1:10    HS 0/0    Hormodendrum Cladospor.   1:10    HS 0/0    Aspergillus Mix   1:10    HS 0/0    Aspergillus Fumigatus   1:10    HS 0/0    Penicillium Mix   1:10    HS 0/0  Skin test interpretation: The patient demonstrated an appropriate response to histamine. Percutaneous testing to a panel of common aeroallergens was positive to dog, dust mite, trees, grasses, and amaranths.        01/04/2023  2:00 PM   Food Testing Review Flowsheet    Type of testing performed: Percutaneous    (+)   Histamine     10mg /ml     HS 7/15    (-)   0.9% NaCl/   0.03%Alb/   0.4% Phenol 0/0    Milk 0/0    Egg     (White) 0/0    Shrimp 4/6    Crab 0/0    Lobster 0/0    Clam 0/0    Scallop 0/0    Oyster 0/0    Cinnamon 0/0      Skin test interpretation: The patient demonstrated an appropriate response to histamine. Percutaneous testing to selected foods was positive to shrimp.        01/04/2023  2:00 PM 01/04/2023  2:48 PM   Allergy Medication Testing Review Flowsheet     Type of testing performed: Percutaneous  Intradermal    (+)   Histamine 7/15  10/40    PCN 10,000 units/ml 0/0  0/0    Pre-pen (BPO):   major determinant 0/0  0/0    Ampicillin:  2.5mg /ml 0/0  0/0      Skin test interpretation: The patient demonstrated an appropriate response to histamine. Percutaneous and intradermal testing to  penicillin G potassium (10,000 Units/mL), major determinant (benzylpenicilloyl) of penicillin (benzylpenicillin) and ampicillin (2.5mg /ml) were negative.       IMPRESSION & PLAN:  Theresa Bullock is a 42 y.o. female presenting for follow up of:     1. Allergic rhinitis  -- Reviewed results of skin test that was positive for dog, dust mite, tree, grass, and weed pollen.   -- Allergen avoidance measures were reviewed.   -- Continue fluticasone 2 sprays in each nostril once daily.   -- Continue fexofenadine 180mg  daily.   -- Continue montelukast 10mg  nightly.   -- We discussed role of allergy shots in management of allergic rhinitis and, sometimes, asthma. Discussed risks, benefits, and expectations of therapy. Cost estimation information was given. She will let us know if she decides to move forward with allergy shots.      2. Moderate persistent asthma, presently well controlled   -- Managed by pulmonary (Dr. Vella Redhead and Marilynn Rail, FNP)  -- Continue Symbicort 2 puffs BID with spacer tube.  -- Duoneb or albuterol for acute symptoms if needed.      3. ?Allergy to amoxicillin/Augmentin  -- Timing of acute worsening of respiratory symptoms last fall is concerning for a possible drug allergy to amoxicillin.   -- Penicillin skin testing was negative today.    -- Discussed moving forward with amoxicillin challenge to prove tolerance.     4. Food allergy   -- History concerning for anaphylaxis to crab - tongue swelling, drooling, headache, and dizziness/near syncope. Skin test was negative to crab, but slightly positive to shrimp.   -- Continue avoidance of shellfish that have caused symptoms.   -- Blood work was ordered for further evaluation of food allergy. If negative will consider food challenge to crab.   -- Testing to cinnamon was negative. Ok to reintroduce into diet, but avoid if it's not tolerated.   -- Itching without rash and no other IgE-mediated symptoms after eating kiwi would not be considered a food allergy.   --  Tolerating strawberry and other fresh fruits and vegetables.   -- New wheezing with ingestion of several forms of dairy, egg, and chocolate since her asthma exacerbation in fall 2023. Likely secondary to GERD.   -- Keep 2 epinephrine autoinjectors available at all times. Rx sent.       Follow up: Amoxicillin challenge, next available.     Thank you for allowing [place] to participate in the care of your patient. Please do not hesitate to contact us with any questions or concerns.     Sincerely,    Ivory Broad, FNP  Allergy & Immunology  [facility]  [address], [address]  [place], [place] [address]  Clinic phone: [phone]      I personally spent 30 minutes on the calendar day of the encounter, including pre-charting, clinical encounter, and post-visit documentation.

## 2023-01-04 NOTE — Patient Instructions (Addendum)
Environmental Allergies  -- Environmental skin test was positive to: dog, dust mites, tree pollen, grass pollen, weed pollens.     -- Continue Flonase, Allegra, and Singulair.    Allergen info:   -- Tree pollen season is March - May  -- Grass pollen season is mid- May - early July  -- Weed pollen season is mid-August - frost  -- Keep windows closed and use AC if you can.   -- Take a shower, change your clothes after spending time outside.     -- Try to keep the dog out of your bedroom if possible. Consider using an air purifier in your bedroom.     Dust mites are microscopic critters that live in mattresses, blankets, stuffed animals, and carpets. The droppings of house dust mites are a common cause of indoor allergies.  The following things will help limit your exposure to dust mites.    If you can, purchase allergy covers for your pillows and mattress.  These should be made of a tightly woven fabric and zip completely around, incasing the bedding.  They should be impermeable (pore size ? 10 ?m).  Wash all bedding in hot water (54.4C, or 130F), and dry on the highest heat setting once per week.   Ideally, if you can, remove the carpet and/or area rug(s) as well as heavy draperies from the bedroom.   If able, remove upholstered furniture or change to vinyl or leather.   Use a vacuum with a HEPA (high-efficiency particulate air) filter or a double-layered bag, and wear a dust mask if needed.  Avoid using room humidifiers. Dust mites thrive in humid environments. You may want to purchase a de-humidifier, or run the air conditioning in the summer months. A hygrometer can be purchased to measure humidity, which should ideally be kept < 50%.      -- Consider starting allergy shots. Here is the cost estimate information:     COST ESTIMATION WORKSHEET FOR IMMUNOTHERAPY  Person making the call:___________________________________________________________  Please have this following information available before  calling.  [phone]  Or  [email or web address]    Patient Name (please spell):     Last Name:_____________________________First Name:______________________________  Patient Date of Birth:_________/________/________  Phone #:(________)-______-_________ Alternate Phone #:(________)-______-_________    Codes for immunotherapy:            Extracts/Serums:   95165  4 Vial Set:        1 = 28 doses           Injection Charge: 1 = 95115                It may take up to 5 days for the Cost Estimation Line to respond with your estimate.    Once you have decided whether you will proceed with immunotherapy (allergy shots) please either call the office at [phone] or send a MyChart message to your allergist.  Once we receive your authorization, your extracts will be ordered.  It will take several weeks to have them made up.  Once they are ready, you will receive a phone call to schedule your first injection.    _______________________________________________________________________    Food Allergies    -- Skin test was positive to shrimp, negative to milk, egg, crab, lobster, clam, scallop, oyster, and cinnamon.   -- Continue to avoid shellfish. Keep 2 EpiPen devices available at all times.   -- Ok to try milk, egg, and cinnamon.   -- Have blood work drawn to double  check shellfish allergy.   -- Future consideration for a food challenge to crab, pending your blood work results.       _______________________________________________________________________      Penicillin Allergy    -- Skin test was negative for penicillin allergy.  -- Return for amoxicillin challenge appointment. You will need to be off of your antihistamines for 5 days before this appointment.         _______________________________________________________________________    Allergy Shots (Immunotherapy)    Allergen immunotherapy, also known as allergy shots, is a form of long-term treatment that decreases symptoms for many people with  allergic rhinitis, allergic asthma, conjunctivitis (eye allergy) or stinging insect allergy.  Allergy shots decrease sensitivity to allergens and often leads to lasting relief of allergy symptoms even after treatment is stopped. This makes it a cost-effective, beneficial treatment approach for many people.    Who Can Benefit From Allergy Shots?  Both children and adults can receive allergy shots, although it is not typically recommended for children under age 42. This is because of the difficulties younger children may have in cooperating with the program and in articulating any adverse symptoms they may be experiencing. When considering allergy shots for an older adult, medical conditions such as cardiac disease should be taken into consideration and discussed with your allergist / immunologist first.  You and your allergist / immunologist should base your decision regarding allergy shots on:   Length of allergy season and severity of your symptoms    How well medications and/or environmental controls are helping your allergy symptoms    Your desire to avoid long-term medication use    Time available for treatment (allergy shots requires a significant commitment)   Cost, which may vary depending on region and insurance coverage  Allergy shots are not used to treat food allergies. The best option for people with food allergies is to strictly avoid that food.    How Do Allergy Shots Work?  Allergy shots work like a vaccine. Your body responds to injected amounts of a particular allergen, given in gradually increasing doses, by developing immunity or tolerance to the allergen.  There are two phases:   Build-up phase. This involves receiving injections with increasing amounts of the allergens about one to two times per week. The length of this phase depends upon how often the injections are received, but generally ranges from three to six months.    Maintenance phase. This begins once the effective dose is reached.  The effective maintenance dose depends on your level of allergen sensitivity and your response to the build-up phase. During the maintenance phase, there will be longer periods of time between treatments, ranging from two to four weeks. Your allergist / immunologist will decide what range is best for you.  You may notice a decrease in symptoms during the build-up phase, but it may take as long as 12 months on the maintenance dose to notice an improvement. If allergy shots are successful, maintenance treatment is generally continued for three to five years. Any decision to stop allergy shots should be discussed with your allergist / immunologist.     How Effective Are Allergy Shots?  Allergy shots have shown to decrease symptoms of many allergies. It can prevent the development of new allergies, and in children it can prevent the progression of allergic disease from allergic rhinitis to asthma. The effectiveness of allergy shots appears to be related to the length of the treatment program as well as the dose of  the allergen. Some people experience lasting relief from allergy symptoms, while others may relapse after discontinuing allergy shots. If you have not seen improvement after a year of maintenance therapy, your allergist / immunologist will work with you to discuss treatment options.  Failure to respond to allergy shots may be due to several factors:   Inadequate dose of allergen in the allergy vaccine    Missing allergens not identified during the allergy evaluation   High levels of allergen in the environment    Significant exposure to non-allergic triggers, such as tobacco smoke    Where Should Allergy Shots Be Given?  This type of treatment should be supervised by a specialized physician in a facility equipped with proper staff and equipment to identify and treat adverse reactions to allergy injections. Ideally, immunotherapy should be given in your allergist / immunologist's office. If this is not  possible, your allergist / immunologist should provide the supervising physician with comprehensive instructions about your allergy shot treatments.    Are There Risks?  A typical reaction is redness and swelling at the injection site. This can happen immediately or several hours after the treatment. In some instances, symptoms can include increased allergy symptoms such as sneezing, nasal congestion or hives.  Serious reactions to allergy shots are rare. When they do occur, they require immediate medical attention. Symptoms of an anaphylactic reaction can include swelling in the throat, wheezing or tightness in the chest, nausea and dizziness. Most serious reactions develop within 30 minutes of the allergy injections. This is why it is recommended you wait in your doctor's office for at least 30 minutes after you receive allergy shots.

## 2023-01-04 NOTE — Progress Notes (Signed)
GAMA NOTE    Past Medical History: has Chronic midline low back pain without sciatica; Class 1 obesity; and Pre-diabetes on their problem list.      Medications:   Current Outpatient Medications   Medication Sig    fexofenadine (ALLEGRA) 180 mg tablet Take 1 tablet (180 mg total) by mouth daily    EPINEPHrine (EPIPEN) 0.3 mg/0.3 mL auto-injector Inject 0.3 mLs (0.3 mg total) into the muscle as needed for Anaphylaxis    ipratropium-albuterol (DUONEB) 0.5-2.5mg  /28mL nebulizer solution Inhale 3 mLs by nebulization 4 times daily as needed for Wheezing    levalbuterol (XOPENEX) 0.63 mg/40mL nebulizer solution Inhale 3 mLs by nebulization every 6 hours as needed for Wheezing    budesonide-formoterol (SYMBICORT) 160-4.5 MCG/ACT inhaler Inhale 2 puffs into the lungs 2 times daily  Shake well before each use.    montelukast (SINGULAIR) 10 mg tablet Take 1 tablet (10 mg total) by mouth nightly    albuterol HFA (PROVENTIL, VENTOLIN, PROAIR HFA) 108 (90 Base) MCG/ACT inhaler Inhale 1-2 puffs into the lungs every 6 hours as needed for Wheezing  Shake well before each use.    generic DME Sleep Study    mupirocin (BACTROBAN) 2 % ointment Apply topically 3 times daily  for Wound Infection to the following areas: abdomen    fluocinolone 0.01% scalp (DERMA-SMOOTHE/FS) 0.01 % OIL external oil Apply topically nightly as needed  to the following areas: scaklp    ketoconazole (NIZORAL) 2 % shampoo Apply topically once a week  to the following areas: scalp    olopatadine (PATADAY) 0.2 % ophthalmic solution Place 1 drop into both eyes daily    meloxicam (MOBIC) 7.5 mg tablet Take 1 tablet (7.5 mg total) by mouth daily     No current facility-administered medications for this visit.        Allergies:   Allergies   Allergen Reactions    Augmentin [Amoxicillin-Pot Clavulanate] Anaphylaxis     Possible anaphylaxis vs asthma exacerbation. In process of evaluating amoxicillin allergy in allergy clinic. Skin test/challenge pending.      Shellfish-Derived Products Anaphylaxis     Crab only. Tolerates shrimp, lobster, and crawfish.    Cinnamon Other (See Comments)     Tongue irritation        HPI/ROS:     Theresa Bullock is a 42 yo F recently diagnosed asthma (hospitalized for status asthmaticus, 08/17/22-08/19/22), class 2 obesity presenting for routine follow-up    Last seen on 11/20/22.  - ENT (12/25/22): Plan for MRI head w/wo contrast, MRA/MRV given pulsatile tinnitus and episodic vertigo  - Pulm (12/06/22): Prescribed 5 days prednisone by NP Lucas Mallow.  Plan to discuss biologic.  - Allergy (12/07/22): Plan continue current meds (fexofenadine, montelukast, flonase nasal spray, Symbicort), plan to see if allergic to Augmentin  - Sleep Medicine (11/29/22) Discussed results of sleep study and discussed CPAP.  Home Sleep Study on 11/08/22 - AHI 9.6, hypoxia nadir 79%    Theresa Bullock has not been able to get the CPAP yet due to insurance.  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    Theresa Bullock has seen two bariatric specialists in [place] [facility]) on 04/08 and 04/10 respectively.  She was too frustrated by the delay with the [facility] bariatrics team, had been told she didn't qualify.  Discussed potential surgeries  Has been noticing acid reflux, has been coughing.  Has been having acid taste in mouth in most morning.  Has not taken the omeprazole  in a month.  Last took famotidine a week ago, last Sunday, for epigastric burning.  She stopped taking omeprazole daily in March.    Had an esophagram yesterday (01/03/23) at [facility] as part of the bariatrics, normal esophageal motility, no hiatal hernia.    Theresa Bullock reports that the asthma is currently controlled.  Not having any wheezing.  Had noticed a cold right before the most recent asthma flare as well.  Confirms Symbicort BID, monelukast nightly, fexofenadine daily.  Hasn't needed the rescue inhaler or nebulizer recently.    OBJECTIVE:      Vitals:    01/04/23 1114   BP: 118/86   BP Location: Right  arm   Patient Position: Sitting   Cuff Size: large adult   Pulse: 64   Weight: 100.7 kg (222 lb)   Height: 1.626 m (5\' 4" )       PHYSICAL EXAM:  Constitutional: Pleasant, comfortable, NAD  CV: RRR, no murmur, no LE edema  Pulm: Normal WOB, CTAB, no w/r/r  Abd: Soft, NT, ND, normoactive BS    No results found for this or any previous visit (from the past 24 hour(s)).      ASSESSMENT/PLAN:     Tyrone Troise is a 42 yo F recently diagnosed asthma (hospitalized for status asthmaticus, 08/17/22-08/19/22), class 2 obesity presenting for routine follow-up  - Last seen on 11/20/22.  Theresa Bullock has had worsening GERD symptoms since stopping PPI in March (cough, epigastric burning, sour taste in mouth in the morning).  Plan to restart PPI and refer to GI for EGD (also needed to determine whether would benefit from gastric sleeve vs gastric bypass).  - Paperwork completed stating that medically optimized for bariatric surgery.  Discussed though that may be more beneficial to have bariatric surgery in [place] rather than in [place].    Gastroesophageal reflux disease without esophagitis  -     Restart omeprazole 40 mg daily  -     Refer to GI (Worsening GERD symptoms, EGD requested as part of bariatric surgery work-up)    Moderate persistent asthma with recent status asthmaticus  -     Continue albuterol 1-2 puffs every 4-6 hrs as needed  - Continue Symbicort 160-4.5 two puffs BID  - Continue montelukast 10 mg nightly  - Continue fexofenadine every morning  -     Continue pulm & allergy follow-up    Class 2 Obesity, c/b OSA, c/b pre-diabetes  - Currently waiting for CPAP, delayed by insurance logistics  - Discussed bariatric surgery evaluation at [facility] and at [place].  Reports she was told by [facility] team that she did not qualify for bariatric surgery when previously attempted to schedule.  Will try to determine what happened as would be easier to undergo surgery and all necessary pre-surgical follow-up where she lives.  - Home Sleep  Study (11/08/22): AHI 9.6, hypoxia nadir 79%  - Normal TSH &  free T4 (10/04/22)  - HgA1c 6.2 (08/17/22)    Health Maintenance:  Cancer Screening  - Benign Pap, no HR-HPV (12/12/21)  - Normal mammogram (09/11/22)  Vaccinations  - Covid-19 Updated 11/06/22  - Seasonal influenza, 11/06/22  - Tdap 05/17/17    I spent 36 minutes in the room with the patient and on pre-/post-visit clinical work on the calendar day.    Follow up: 4 mo, earlier prn  ______________________  Glade Stanford MD

## 2023-01-04 NOTE — Progress Notes (Signed)
Skin testing completed to aeroallergens per order. Patient tolerated well. Hydrocortisone cream offered for itching.

## 2023-01-08 ENCOUNTER — Telehealth: Payer: Self-pay | Admitting: Allergy/Immunology/Rheumatology

## 2023-01-08 ENCOUNTER — Telehealth: Payer: Self-pay

## 2023-01-08 NOTE — Telephone Encounter (Signed)
TC to Pt. Reminding pt. Of scheduled appt. On 01/18/23 @ 2:00 in [place].  Also discontinue antihistamines 5 days prior to appt.

## 2023-01-08 NOTE — Telephone Encounter (Unsigned)
Copied from CRM #[id number]. Topic: Appointments - Schedule Appointment  >> Jan 08, 2023 12:21 PM Kara Mead wrote:  Patient called to check on status of referral. Writer advised referral has been received.      Once the referral has been reviewed one of our staff will reach out to schedule an appointment.     Patient can be reached at [phone].

## 2023-01-08 NOTE — Telephone Encounter (Signed)
Referral sent to [facility] for review

## 2023-01-09 NOTE — Telephone Encounter (Signed)
Ippolito, Joseph L, NP  Mykah Shin  Referral has been reviewed - Please schedule as follows:    Patient to be scheduled with: General GI APP (All APPs see Gen GI)    Schedule within (time frame): Next Available

## 2023-01-10 NOTE — Telephone Encounter (Signed)
Called patient,no answer left detailed message on voicemail notified we have received their referral for a NPV on 01/04/2023 and she is on the wait list and will be contacted by a member of GI staff when an appointment is available. We cannot guarantee a timeframe at this time as referrals are being processed in order of which they came. Patient was instructed if they do not want to wait for a call to reach out to their referring provider for alternative options.

## 2023-01-11 ENCOUNTER — Ambulatory Visit: Payer: PRIVATE HEALTH INSURANCE | Admitting: Otolaryngology

## 2023-01-12 ENCOUNTER — Telehealth: Payer: Self-pay | Admitting: Pulmonology

## 2023-01-12 ENCOUNTER — Ambulatory Visit: Payer: PRIVATE HEALTH INSURANCE | Admitting: Otolaryngology

## 2023-01-12 NOTE — Telephone Encounter (Unsigned)
Copied from CRM [id number]. Topic: Access to Care - Speak to Provider/Office Staff  >> Jan 12, 2023  3:02 PM Tora Duck wrote:  Dava is calling stating she needs surgical clearance for bariatric surgery for Dr. Rennis Petty of [facility] Medicine Phone [phone] Fax# [phone] or Fax# [phone]    Patient would like to be advised on whether or not she needs an appointment for the clearance.  If no, info can be faxed to the above Fax #    Patient can be reached at [phone] (mobile)

## 2023-01-14 DIAGNOSIS — K219 Gastro-esophageal reflux disease without esophagitis: Secondary | ICD-10-CM | POA: Insufficient documentation

## 2023-01-14 DIAGNOSIS — G4733 Obstructive sleep apnea (adult) (pediatric): Secondary | ICD-10-CM | POA: Insufficient documentation

## 2023-01-14 DIAGNOSIS — J4540 Moderate persistent asthma, uncomplicated: Secondary | ICD-10-CM | POA: Insufficient documentation

## 2023-01-15 NOTE — Telephone Encounter (Signed)
Spoke with patient.  We have a spirometry with DLCO from January 2024.  I advised patient to reach out to surgeon since bariatric surgery is considered for August 2024, will she need a new spirometry and DLCO or is the January testing sufficient?  Patient verbalized understanding and will ask the surgeon regarding DLCO.  She will get back to me.Marilynn Rail, NP

## 2023-01-16 ENCOUNTER — Ambulatory Visit: Payer: PRIVATE HEALTH INSURANCE | Attending: Otolaryngology | Admitting: Physical Medicine and Rehabilitation

## 2023-01-16 ENCOUNTER — Other Ambulatory Visit: Payer: Self-pay

## 2023-01-16 ENCOUNTER — Encounter: Payer: Self-pay | Admitting: Physical Medicine and Rehabilitation

## 2023-01-16 ENCOUNTER — Other Ambulatory Visit: Payer: Self-pay | Admitting: Otolaryngology

## 2023-01-16 ENCOUNTER — Ambulatory Visit
Admission: RE | Admit: 2023-01-16 | Discharge: 2023-01-16 | Disposition: A | Discharge status: Home / Self Care | Payer: PRIVATE HEALTH INSURANCE | Source: Ambulatory Visit

## 2023-01-16 VITALS — BP 136/85 | Ht 64.0 in | Wt 222.0 lb

## 2023-01-16 DIAGNOSIS — H93A1 Pulsatile tinnitus, right ear: Secondary | ICD-10-CM

## 2023-01-16 DIAGNOSIS — R42 Dizziness and giddiness: Secondary | ICD-10-CM | POA: Insufficient documentation

## 2023-01-16 DIAGNOSIS — M545 Low back pain, unspecified: Secondary | ICD-10-CM | POA: Insufficient documentation

## 2023-01-16 LAB — POCT URINE PREGNANCY
Lot #: 732331
Preg Test,UR POC: NEGATIVE

## 2023-01-16 MED ORDER — GADOPICLENOL 0.5 MMOL/ML (VUEWAY) IV SOLN *I*
0.1000 mL/kg | Freq: Once | INTRAVENOUS | Status: AC
Start: 2023-01-16 — End: 2023-01-16
  Administered 2023-01-16: 9.93 mL via INTRAVENOUS

## 2023-01-16 NOTE — Progress Notes (Signed)
ID: Sullivan Backhus is a 42 y.o. year old  bilateral lumbo sacral  pain     CC:  Chief Complaint   Patient presents with    Spine - Follow-up     HPI:  began in pregnancy last one 2018;  slowly worse     SYMPTOMS LOCATION: as above     SEVERITY: variable unbearable;  20 lf 10 like labor     EXACERBATING FACTOR: standing , bending , sweekping j    ALLEVIATING FACTORS: sitting , leaning forward.     EVALUATION AND WORK UP: xray has been done. 2022     TREATMENTS: in past they ordered some meloxicam.  It helps but makes her drowsi.  No other meds.    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        ===========================================    January 16 2023;  Still with low back pain . Not major leg pain      Medical History:  Past Medical History:   Diagnosis Date    Anemia 13-Jun-1980    Sickle Cell Anemia Trait    Asthma 08/17/22    Obesity     Pre-diabetes     Sickle cell trait        Surgical History:  Past Surgical History:   Procedure Laterality Date    abdominoplasty      liposuction & abdominoplasty c/b cellulitis (2020)             ZOX:WRUEAV of Systems , 10 systems reviewed all normal  .         Allergies:  Allergies   Allergen Reactions    Augmentin [Amoxicillin-Pot Clavulanate] Anaphylaxis     Possible anaphylaxis vs asthma exacerbation. In process of evaluating amoxicillin allergy in allergy clinic.   Penicillin skin test 01/04/23 is negative.   Amoxicillin challenge is pending.     Shellfish-Derived Products Anaphylaxis     Crab only. Tolerates shrimp, lobster, and crawfish.    Cinnamon Other (See Comments)     Tongue irritation     Crab (Diagnostic) Anaphylaxis        Vitals:    01/16/23 1123   BP: 136/85   Weight: 100.7 kg (222 lb)   Height: 1.626 m (5\' 4" )     Body mass index is 38.11 kg/m.  Physical Exam:    Observations: She is awake alert oriented clear cogent no acute distress.            [[[[[A andn o cand co  nad; twdwg;; nba;; snag; up and oneasy;; slr neg. No ttp; hip range normal;; ssr noarma.,   Presentation:.  Timely fashion well-dressed well-groomed  Affect:.  Normal bright affable  Gait and station:.  Stable nonantalgic  Postures:.  Sitting posture normal standing station normal    Testing:.  They get up and out of a chair independently and without difficulty,  get up and onto an exam table independently and without difficulty.  Straight leg raise is negative.  There is no deformity of the cervical thoracic or lumbar spine to visual or palpatory inspection.  There is no tenderness over the subgluteal or trochanteric bursa. There is no atrophy of either upper or lower extremity.  Hip range of motion does not reproduce  symptoms.    Range of motion:.  Forward flexion hands to shins  Skin:.  There is no lymphadenopathy in the cervical, inguinal or axillary regions.  Skin is intact in all four limbs and in the trunk.  Tibial and radial artery pulses are intact bilaterally.  Lymph:Marland Kitchen  Pulses:.    Neuromuscular:Marland Kitchen    Motor Strength:.  Strength is normal in the shoulder abductors, elbow flexors, wrist extensors and finger intrinsics bilaterally.  In the lower limbs strength is normal in the hip flexors, knee extensors, ankle dorsiflexors, and in the EHL bilaterally    Sensory Exam:.  Sensation is normal to light touch to all dermatomes in the upper and lower extremities bilaterally.    Reflexes:.  Reflexes are symmetric and normal at the biceps, triceps, brachioradialis, patellar tendon, and Achilles tendon bilaterally.  Toes are downgoing.  Romberg is negative.    Imaging:.  08/17/2021 10:31 AM     No evidence for fracture.      Minimal degenerative changes with facet arthropathy most prominent at the L5-S1 level.      END OF IMPRESSION       Other Diagnostics:.    Assessment:.      Continued back pain   No resolution with caregiver directed home exercise program for 10 plus weeks.   Has completed course of meloxicam.      PLAN:  MRI  Fu aftermri to determine if interventions are warrented.

## 2023-01-17 ENCOUNTER — Other Ambulatory Visit: Payer: Self-pay | Admitting: Otolaryngology

## 2023-01-17 DIAGNOSIS — H93A1 Pulsatile tinnitus, right ear: Secondary | ICD-10-CM

## 2023-01-18 ENCOUNTER — Telehealth: Payer: Self-pay | Admitting: Allergy/Immunology/Rheumatology

## 2023-01-18 ENCOUNTER — Other Ambulatory Visit: Payer: Self-pay

## 2023-01-18 ENCOUNTER — Ambulatory Visit: Payer: PRIVATE HEALTH INSURANCE | Admitting: Allergy/Immunology/Rheumatology

## 2023-01-18 ENCOUNTER — Encounter: Payer: Self-pay | Admitting: Otolaryngology

## 2023-01-18 ENCOUNTER — Ambulatory Visit: Payer: PRIVATE HEALTH INSURANCE

## 2023-01-18 ENCOUNTER — Other Ambulatory Visit: Payer: Self-pay | Admitting: Allergy/Immunology/Rheumatology

## 2023-01-18 DIAGNOSIS — Z880 Allergy status to penicillin: Secondary | ICD-10-CM

## 2023-01-18 DIAGNOSIS — J45909 Unspecified asthma, uncomplicated: Secondary | ICD-10-CM

## 2023-01-18 DIAGNOSIS — T50905A Adverse effect of unspecified drugs, medicaments and biological substances, initial encounter: Secondary | ICD-10-CM

## 2023-01-18 DIAGNOSIS — Z889 Allergy status to unspecified drugs, medicaments and biological substances status: Secondary | ICD-10-CM

## 2023-01-18 DIAGNOSIS — J309 Allergic rhinitis, unspecified: Secondary | ICD-10-CM

## 2023-01-18 NOTE — Progress Notes (Signed)
Pt arrived for  medication challenge to amoxicillin  Assessment completed  Pt denies any allergy symptoms at present.  Feels well and has eaten today  Pt is not currently taking any antihistamines or other histamine blocking medications.    Vital signs stable.  Medication Challenge risks discussed and agreement signed  1st dose administered at: 1:45 p.m.   Pt observed for  hour  Vital signs stable.   Denies any allergic symptoms  Provider in to see pt to discuss prior to discharge

## 2023-01-18 NOTE — Progress Notes (Signed)
Called pt with Radiology appt date & time as follows:    PROCEDURE:  MRV head w/o contrast   MRA head w/ & w/o contrast    DATE & TIME:  Saturday 02/03/23 at 2:00 pm    LOCATION:  [facility]  [address], [place], [place] [address]      Prior Testing/Lab work needed: N/a      Instructions:  N/a      Prior Auth: Central to obtain      Message left-confirmation call requested Yes. LVM informing patient of imaging appointment.

## 2023-01-18 NOTE — Progress Notes (Signed)
Allergy Consult Note    PRIMARY CARE PHYSICIAN:  Frances Nickels, MD   REFERRING PHYSICIAN: Fransico Setters, Grayland Jack, *      HPI:  Theresa Bullock is a 42 y.o. female with past medical history significant for asthma, allergic rhinitis, food allergies, and medication allergy . Theresa Bullock was last seen in our office on 01/04/23 by Ivory Broad penicillin . In November of 2023 Theresa Bullock was hospitalized for acute respiratory distress, she had taken two doses of Augmentin prior to her symptoms worsening, presenting the concern for a penicillin allergy vs asthma exacerbation. Penicillin skin testing on 01/04/23 was negative today she is presenting for the oral amoxicillin challenge to clarify if she is allergic to penicillin.       There have been no reactions to vaccines.    Today, Theresa Bullock is well and off antihistamines. There has been no recent fever or illness.      CURRENT MEDICATIONS:   Current Outpatient Medications   Medication Sig Dispense Refill    omeprazole (PRILOSEC) 40 mg capsule Take 1 capsule (40 mg total) by mouth daily 90 capsule 3    fexofenadine (ALLEGRA) 180 mg tablet Take 1 tablet (180 mg total) by mouth daily.      EPINEPHrine (EPIPEN) 0.3 mg/0.3 mL auto-injector Inject 0.3 mLs (0.3 mg total) into the muscle as needed for Anaphylaxis 2 each 2    budesonide-formoterol (SYMBICORT) 160-4.5 MCG/ACT inhaler Inhale 2 puffs into the lungs 2 times daily  Shake well before each use. 3 each 3    montelukast (SINGULAIR) 10 mg tablet Take 1 tablet (10 mg total) by mouth nightly 90 tablet 3    albuterol HFA (PROVENTIL, VENTOLIN, PROAIR HFA) 108 (90 Base) MCG/ACT inhaler Inhale 1-2 puffs into the lungs every 6 hours as needed for Wheezing  Shake well before each use. 1 each 5    fluocinolone 0.01% scalp (DERMA-SMOOTHE/FS) 0.01 % OIL external oil Apply topically nightly as needed  to the following areas: scaklp 120 mL 2    ketoconazole (NIZORAL) 2 % shampoo Apply topically once a week  to the following areas:  scalp 120 mL 3    olopatadine (PATADAY) 0.2 % ophthalmic solution Place 1 drop into both eyes daily 2.5 mL 0    meloxicam (MOBIC) 7.5 mg tablet Take 1 tablet (7.5 mg total) by mouth daily 30 tablet 5     No current facility-administered medications for this visit.       ALLERGIES:   Allergies   Allergen Reactions    Augmentin [Amoxicillin-Pot Clavulanate] Anaphylaxis     Possible anaphylaxis vs asthma exacerbation. In process of evaluating amoxicillin allergy in allergy clinic.   Penicillin skin test 01/04/23 is negative.   Amoxicillin challenge is pending.     Shellfish-Derived Products Anaphylaxis     Crab only. Tolerates shrimp, lobster, and crawfish.    Cinnamon Other (See Comments)     Tongue irritation     Crab (Diagnostic) Anaphylaxis       PAST MEDICAL HISTORY:   Past Medical History:   Diagnosis Date    Anemia Nov 03, 1979    Sickle Cell Anemia Trait    Asthma 08/17/22    Obesity     Pre-diabetes     Sickle cell trait        PAST SURGICAL HISTORY:  Past Surgical History:   Procedure Laterality Date    abdominoplasty      liposuction & abdominoplasty c/b cellulitis (2020)       FAMILY HISTORY:  Family History   Problem Relation Age of Onset    Allergic rhinitis Mother     Diabetes Mother     Breast cancer Mother     Stroke Mother     Heart failure Mother     Diabetes Father     High Blood Pressure Father     Cataracts Father     Sickle cell trait Father     Anemia Father     Breast cancer Maternal Aunt     Alzheimer's disease Maternal Aunt     Diabetes Maternal Uncle     Stroke Maternal Uncle     High Blood Pressure Maternal Grandmother     Pancreatic Cancer Maternal Grandfather     Diabetes Paternal Grandmother     Autism Son     Colon cancer Neg Hx     Thyroid cancer Neg Hx        PHYSICAL EXAMINATION:   LMP 12/31/2022 (Approximate)   Constitutional: Alert, appears well, and in no acute distress  Eyes: No conjunctival erythema, no eyelid or periorbital edema  Ears: Canals normal, TM's visible bilaterally and  normal  Nose: No significant discharge, nasal mucosa and turbinates are normal  Oropharynx: Normal mucosa, no oral lesions, posterior oropharynx without edema or erythema  Cardiovascular: Normal S1-S2, no murmur or gallop or rub, regular rate and rhythm.   Respiratory: Clear to auscultation bilaterally, good air movement, breathing comfortably on room air   Skin: No visible rash.   Neurologic: Moving all extremities, mental status normal for age  Psychiatric: Normal affect for age      SKIN TESTING (Penicillin):    01/04/2023  2:00 PM 01/04/2023  2:48 PM   Allergy Medication Testing Review Flowsheet     Type of testing performed: Percutaneous  Intradermal    (+)   Histamine 7/15  10/40    PCN 10,000 units/ml 0/0  0/0    Pre-pen (BPO):   major determinant 0/0  0/0    Ampicillin:  2.5mg /ml 0/0  0/0        The patient had a normal response to histamine. Percutaneous and intradermal testing to penicillin G potassium (10,000 Units/mL), major determinant (benzylpenicilloyl) of penicillin (benzylpenicillin) and ampicillin (2.5mg /ml) were negative.      PROCEDURE:  After obtaining verbal informed consent for medication challenge, the patient underwent challenge to amoxicillin (see nursing record also). I periodically examined the patient and the exam was unchanged throughout the challenge. The challenge was negative and the patient was discharged home well.       ASSESSMENT/PLAN:  Theresa Bullock is a 42 y.o. female presenting for evaluation of her allergy to penicillin described as trouble breathing that occurred November of 2023. Given her URI symptoms and asthma history at the time she took Augmentin, we decided to pursue testing to clarify her drug allergy status.     Today's penicillin  skin test and medication challenge to amoxicillin  were negative.   Today's medication challenge to amoxicillin  was negative. Penicillin/amoxicillin was removed from her allergy list. The implications of a negative drug challenge were  discussed.  We discussed that Aspin is at no greater risk than anyone in the general population for an immediate, allergic (IgE mediated hypersensitivity) reaction to this medication. It was reviewed that although it is unlikely that allergic symptoms develop after a negative challenge, Ivet should be monitored at home and she (or her family) should be in contact with [place] if any symptoms develop.  We  also discussed that this evaluation does not rule out delayed reactions that can occur with any medication.     We also discussed that the patient/family should inform her pharmacy and other care providers of this and any other changes to her allergy record to prevent improper labeling in other electronic health record systems.      Thank you for the opportunity to participate in the care of your patient, Neriah Argetsinger.  Should there be any questions or concerns, please do not hesitate to contact me.       Sincerely,     Tawni Carnes, FNP  Allergy & Immunology  [facility]  [address]  [place], [place] [address]  Clinic phone: [phone]

## 2023-01-18 NOTE — Patient Instructions (Addendum)
Medication challenge to amoxicillin was negative. Penicillin/amoxicillin was removed from your allergy list. Notify your pharmacy and any provider that is not a part of the [facility] system.     If a delayed reaction occurs please notify our office at any time. We have providers who are on-call who will be able to assist if it occurs after hours as well.

## 2023-01-18 NOTE — Telephone Encounter (Signed)
L/M Pts. Phone Pt. Is required to schedule FUV appt. In 6 Mo.with C.Sande N.P. in [facility] location [facility] & Thurs.PM)

## 2023-01-19 ENCOUNTER — Encounter: Payer: Self-pay | Admitting: Gastroenterology

## 2023-01-22 ENCOUNTER — Encounter: Payer: Self-pay | Admitting: Allergy/Immunology/Rheumatology

## 2023-01-24 ENCOUNTER — Other Ambulatory Visit: Payer: PRIVATE HEALTH INSURANCE

## 2023-01-30 ENCOUNTER — Other Ambulatory Visit: Payer: Self-pay

## 2023-01-30 ENCOUNTER — Other Ambulatory Visit: Payer: PRIVATE HEALTH INSURANCE | Admitting: Radiology

## 2023-01-30 ENCOUNTER — Ambulatory Visit
Admission: RE | Admit: 2023-01-30 | Discharge: 2023-01-30 | Disposition: A | Discharge status: Home / Self Care | Payer: PRIVATE HEALTH INSURANCE | Source: Ambulatory Visit | Attending: Otolaryngology | Admitting: Otolaryngology

## 2023-01-30 ENCOUNTER — Other Ambulatory Visit: Payer: PRIVATE HEALTH INSURANCE

## 2023-01-30 DIAGNOSIS — H93A1 Pulsatile tinnitus, right ear: Secondary | ICD-10-CM | POA: Insufficient documentation

## 2023-01-30 MED ORDER — GADOPICLENOL 0.5 MMOL/ML (VUEWAY) IV SOLN *I*
0.1000 mL/kg | Freq: Once | INTRAVENOUS | Status: AC
Start: 2023-01-30 — End: 2023-01-30
  Administered 2023-01-30: 9.98 mL via INTRAVENOUS

## 2023-01-30 NOTE — Progress Notes (Signed)
Department of Imaging Sciences  MRI Obstructive Sleep Apnea (OSA) Nurse Screening        Patient to have MRI Scan and has a diagnosis of Obstructive Sleep Apnea (OSA) with CPAP/BIPAP prescribed.      Patient Assessment:    Level of Consciousness: awake, alert , and oriented  Breath Sounds: clear  Patient has received an analgesic in the past 4 hours: No  BMI > 40%:No        Monitoring Criteria:    Does patient have diagnosis of OSA? Yes    Is patient ordered to wear CPAP/BIPAP at night (regardless if compliant)? Yes    Is patient lethargic/unable to remain awake (regardless of sedative administration)? No     - If all 3 above criteria are met, SPO2 monitoring is required.      Continuous oximetry monitoring by RN:    [x]Not required patient does not meet requirements for continuous pulse oximetry monitoring.    []Continuous oximetry monitoring by RN is required.  See Doc flowsheets for Vital Signs.      Patient informed Radiologic Technologist staff will:    [x] Communicate with patient at the end of every scanning sequence to ensure patient is awake and tolerating scan  [x] Wake patient if snoring detected; (Radiologic Technologist will notify Imaging RN if patient is snoring)  [x] Provide call bell to patient for use during their scan      Report/Plan given to Radiologic Technologist: Yes

## 2023-02-03 ENCOUNTER — Other Ambulatory Visit: Payer: PRIVATE HEALTH INSURANCE

## 2023-02-06 ENCOUNTER — Other Ambulatory Visit: Payer: PRIVATE HEALTH INSURANCE

## 2023-02-08 ENCOUNTER — Encounter: Payer: Self-pay | Admitting: Pulmonary and Critical Care Medicine

## 2023-02-08 ENCOUNTER — Encounter: Payer: Self-pay | Admitting: Internal Medicine

## 2023-02-08 ENCOUNTER — Encounter: Payer: Self-pay | Admitting: Physical Medicine and Rehabilitation

## 2023-02-12 ENCOUNTER — Encounter: Payer: Self-pay | Admitting: Otolaryngology

## 2023-02-12 DIAGNOSIS — R42 Dizziness and giddiness: Secondary | ICD-10-CM

## 2023-02-12 NOTE — Telephone Encounter (Signed)
Called patient, no answer and no voicemail (Architectural technologist when call is returned)

## 2023-02-15 ENCOUNTER — Other Ambulatory Visit: Payer: Self-pay

## 2023-02-15 ENCOUNTER — Ambulatory Visit
Admission: RE | Admit: 2023-02-15 | Discharge: 2023-02-15 | Disposition: A | Discharge status: Home / Self Care | Payer: PRIVATE HEALTH INSURANCE | Source: Ambulatory Visit | Attending: Physical Medicine and Rehabilitation | Admitting: Physical Medicine and Rehabilitation

## 2023-02-15 DIAGNOSIS — M545 Low back pain, unspecified: Secondary | ICD-10-CM

## 2023-02-15 DIAGNOSIS — M47817 Spondylosis without myelopathy or radiculopathy, lumbosacral region: Secondary | ICD-10-CM | POA: Insufficient documentation

## 2023-02-15 DIAGNOSIS — M4807 Spinal stenosis, lumbosacral region: Secondary | ICD-10-CM

## 2023-02-15 NOTE — Telephone Encounter (Signed)
Letter faxed to [facility].

## 2023-02-16 NOTE — Telephone Encounter (Signed)
Spoke to patient, declined to schedule, has already seen another GI provider.

## 2023-02-21 ENCOUNTER — Other Ambulatory Visit
Admission: RE | Admit: 2023-02-21 | Discharge: 2023-02-21 | Disposition: A | Discharge status: Home / Self Care | Payer: PRIVATE HEALTH INSURANCE | Source: Ambulatory Visit | Attending: Allergy/Immunology/Rheumatology | Admitting: Allergy/Immunology/Rheumatology

## 2023-02-21 ENCOUNTER — Encounter: Payer: Self-pay | Admitting: Internal Medicine

## 2023-02-21 ENCOUNTER — Ambulatory Visit: Payer: PRIVATE HEALTH INSURANCE | Admitting: Internal Medicine

## 2023-02-21 ENCOUNTER — Other Ambulatory Visit: Payer: Self-pay

## 2023-02-21 ENCOUNTER — Encounter: Payer: Self-pay | Admitting: Gastroenterology

## 2023-02-21 ENCOUNTER — Other Ambulatory Visit: Payer: PRIVATE HEALTH INSURANCE

## 2023-02-21 VITALS — BP 120/80 | HR 90 | Ht 64.0 in | Wt 218.0 lb

## 2023-02-21 DIAGNOSIS — R5383 Other fatigue: Secondary | ICD-10-CM | POA: Insufficient documentation

## 2023-02-21 DIAGNOSIS — Z6837 Body mass index (BMI) 37.0-37.9, adult: Secondary | ICD-10-CM

## 2023-02-21 DIAGNOSIS — Z3200 Encounter for pregnancy test, result unknown: Secondary | ICD-10-CM

## 2023-02-21 DIAGNOSIS — G4733 Obstructive sleep apnea (adult) (pediatric): Secondary | ICD-10-CM

## 2023-02-21 DIAGNOSIS — Z6838 Body mass index (BMI) 38.0-38.9, adult: Secondary | ICD-10-CM | POA: Insufficient documentation

## 2023-02-21 DIAGNOSIS — Z01818 Encounter for other preprocedural examination: Secondary | ICD-10-CM

## 2023-02-21 DIAGNOSIS — Z01810 Encounter for preprocedural cardiovascular examination: Secondary | ICD-10-CM

## 2023-02-21 DIAGNOSIS — Z91018 Allergy to other foods: Secondary | ICD-10-CM

## 2023-02-21 DIAGNOSIS — R7303 Prediabetes: Secondary | ICD-10-CM

## 2023-02-21 DIAGNOSIS — J4540 Moderate persistent asthma, uncomplicated: Secondary | ICD-10-CM

## 2023-02-21 LAB — BHCG, QUANT PREGNANCY: BHCG, QUANT PREGNANCY: <1 m[IU]/mL (ref 0–1)

## 2023-02-21 LAB — CBC AND DIFFERENTIAL
Baso # K/uL: 0.1 10*3/uL (ref 0.0–0.2)
Eos # K/uL: 0.6 10*3/uL — ABNORMAL HIGH (ref 0.0–0.5)
Hematocrit: 45 % (ref 34–49)
Hemoglobin: 14.8 g/dL (ref 11.2–16.0)
IMM Granulocytes #: 0 10*3/uL (ref 0.0–0.0)
IMM Granulocytes: 0.3 %
Lymph # K/uL: 3.6 10*3/uL (ref 1.0–5.0)
MCV: 83 fL (ref 75–100)
Mono # K/uL: 0.5 10*3/uL (ref 0.1–1.0)
Neut # K/uL: 3.2 10*3/uL (ref 1.5–6.5)
Nucl RBC # K/uL: 0 10*3/uL (ref 0.0–0.0)
Nucl RBC %: 0 /100 WBC (ref 0.0–0.2)
Platelets: 355 10*3/uL (ref 150–450)
RBC: 5.4 MIL/uL (ref 4.0–5.5)
RDW: 13.2 % (ref 0.0–15.0)
Seg Neut %: 39.8 %
WBC: 8 10*3/uL (ref 3.5–11.0)

## 2023-02-21 LAB — HEPATIC FUNCTION PANEL
ALT: 17 U/L (ref 0–35)
AST: 19 U/L (ref 0–35)
Albumin: 4.5 g/dL (ref 3.5–5.2)
Alk Phos: 57 U/L (ref 35–105)
Bilirubin,Direct: <0.2 mg/dL (ref 0.0–0.3)
Bilirubin,Total: 0.3 mg/dL (ref 0.0–1.2)
Total Protein: 7.2 g/dL (ref 6.3–7.7)

## 2023-02-21 LAB — BASIC METABOLIC PANEL
Anion Gap: 14 (ref 7–16)
CO2: 22 mmol/L (ref 20–28)
Calcium: 9.3 mg/dL (ref 8.8–10.2)
Chloride: 104 mmol/L (ref 96–108)
Creatinine: 0.79 mg/dL (ref 0.51–0.95)
Glucose: 83 mg/dL (ref 60–99)
Lab: 13 mg/dL (ref 6–20)
Potassium: 4.3 mmol/L (ref 3.3–5.1)
Sodium: 140 mmol/L (ref 133–145)
eGFR BY CREAT: 95 *

## 2023-02-21 LAB — TSH: TSH: 0.66 u[IU]/mL (ref 0.27–4.20)

## 2023-02-21 LAB — MULTIPLE ORDERING DOCS

## 2023-02-21 NOTE — Patient Instructions (Signed)
If you can't find where they sent the script for the CPAP, I'd recommend calling your sleep medicine team: [facility] [place]; [address]; [place], [place] [address]; [phone]

## 2023-02-21 NOTE — Progress Notes (Signed)
GAMA NOTE    Past Medical History: has Chronic midline low back pain without sciatica; Class 1 obesity; Pre-diabetes; OSA (obstructive sleep apnea); Gastroesophageal reflux disease without esophagitis; and Moderate persistent asthma without complication on their problem list.      Medications:   Current Outpatient Medications   Medication Sig    omeprazole (PRILOSEC) 40 mg capsule Take 1 capsule (40 mg total) by mouth daily    fexofenadine (ALLEGRA) 180 mg tablet Take 1 tablet (180 mg total) by mouth daily.    EPINEPHrine (EPIPEN) 0.3 mg/0.3 mL auto-injector Inject 0.3 mLs (0.3 mg total) into the muscle as needed for Anaphylaxis    budesonide-formoterol (SYMBICORT) 160-4.5 MCG/ACT inhaler Inhale 2 puffs into the lungs 2 times daily  Shake well before each use.    montelukast (SINGULAIR) 10 mg tablet Take 1 tablet (10 mg total) by mouth nightly    albuterol HFA (PROVENTIL, VENTOLIN, PROAIR HFA) 108 (90 Base) MCG/ACT inhaler Inhale 1-2 puffs into the lungs every 6 hours as needed for Wheezing  Shake well before each use.    fluocinolone 0.01% scalp (DERMA-SMOOTHE/FS) 0.01 % OIL external oil Apply topically nightly as needed  to the following areas: scaklp    ketoconazole (NIZORAL) 2 % shampoo Apply topically once a week  to the following areas: scalp    olopatadine (PATADAY) 0.2 % ophthalmic solution Place 1 drop into both eyes daily    meloxicam (MOBIC) 7.5 mg tablet Take 1 tablet (7.5 mg total) by mouth daily     No current facility-administered medications for this visit.        Allergies:   Allergies   Allergen Reactions    Shellfish-Derived Products Anaphylaxis     Crab only. Tolerates shrimp, lobster, and crawfish.    Cinnamon Other (See Comments)     Tongue irritation     Crab (Diagnostic) Anaphylaxis       HPI/ROS:     Theresa Bullock is a 42 yo F recently diagnosed asthma (hospitalized for status asthmaticus, 08/17/22-08/19/22), class 2 obesity presenting for pre-operative evaluation for bariatric  surgery.    Last seen on 01/04/23.  In interim  - Saw allergy on 01/04/23 without any changes to plan.  Amoxicillin challenge was negative on 01/18/23, discussed infusions for allergies.  - saw PMR on 01/16/23 for low back pain, plan to continue home exercises  - EGD (01/19/23, [facility]) Gastric biopsy notable only for reactive gastropathy, negative for H pylori; Duodenal biopsy notable only for patchy mild increase in intraepithelial lymphocytes and preserved villous architecture (mild, nonspecific findings, possibly c/w Celiac, H pylori, drug-indueced injury, petpic injury, and obesity)  - Most recently saw [facility]ariat[facility] on 02/05/23 plan for lap adjustable gastric banding with repair of 2 cm hiatal hernia.  Met with nutritionist from [facility] bariat[facility] on 02/05/13.    Scheduled for pre-op clearance for gastric laparoscopic band placement at [facility] tentatively scheduled for 08/26  - EKG; HgA1c, CBC, BMP, hepatic function panel, TSH, beta HCG    Theresa Bullock's asthma has been well controlled.  She has only been needing the albuterol twice a month      The acid reflux significantly improved with the omeprazole.  Still having sour taste in her mouth three times per week, generally after she'd had late eating the night before.    Theresa Bullock was never able to get the CPAP device.  Most recently saw [facility] on 11/29/22.  Her husband had been working in [place].  She knows that the  sleep medicine team sent a CPAP Rx to a supply store but she does not know which one.  She is still having daytime drowsiness and fatigue.     Husband is requesting paperwork for this month to put in for leave of absence (through [facility]).  Her husband had been working in [employer] for almost two years, he moved back to [place] and was working remotely around the end of February.  While he had been working remotely, he as recently told that he now needs to take unpaid leave starting around May 1 and is requesting FMLA paperwork.    - Her husband picks  up the children from school.  He does some household chores, including laundry.  She helps kids with their homework and helps with their baths/meals.    - She is still having dizziness a couple times per week.  A dizziness episode will last for a couple hours and will be incapacitating.  Will take meclizine if the dizziness occurs, which won't help but then she'll be drowsy the following day.  She is incapacitated at that time and generally likes in bed.  She cannot care for the kids during those hours, and he needs to take care of them.    OBJECTIVE:      Vitals:    02/21/23 1419   BP: 120/80   BP Location: Left arm   Patient Position: Sitting   Cuff Size: large adult   Pulse: 90   SpO2: 98%   Weight: 98.9 kg (218 lb)   Height: 1.626 m (5\' 4" )     PHYSICAL EXAM:  Constitutional: Pleasant, comfortable, NAD  CV: RRR, no murmur, no LE edema  Pulm: Normal WOB, CTAB, no w/r/r  Abd: Soft, tender to palpation in area of keloids (site of prior liposuction) only, no tenderness in any quadrants, ND, normoactive BS    EKG: Normal sinus    No results found for this or any previous visit (from the past 24 hour(s)).      ASSESSMENT/PLAN:     Theresa Bullock is a 42 yo F with asthma (diagnosed on hospitalization for status asthmaticus, 08/17/22-08/19/22), OSA, and class 2 obesity presenting for pre-operative evaluation for bariatric surgery.  - Last seen on 01/04/23.    Theresa Bullock is medically optimized for surgery.  She is only needing albuterol a couple times per month.  She has mild sleep apnea (AHI 9.6), and discussed that she should reach out to the sleep medicine team about obtaining the CPAP before surgery.  Theresa Bullock 0.0% risk of MI or cardiac risk, intraoperatively for for 30 days post-op.  - RCRI 1 point (Class II Risk), due only to intraperitoneal surgery    Her GERD symptoms have improved with restarting the PPI.    Discussed that I or my team will reach out to husband re FMLA paperwork because it is difficult to know how  to complete (or whether it can be completed) as the care he is providing is primarily for their children and not specifically as her caregiver.    Preoperative examination  -     EKG 12 lead: Normal sinus  -     Hemoglobin A1c  -     CBC and differential  -     Basic metabolic panel  -     Hepatic function panel  -     TSH  -     BHCG, quant pregnancy    Class 2 Obesity, c/b OSA, c/b pre-diabetes  -  Currently waiting for CPAP, delayed by insurance logistics.  Encouraged reaching out to [facility] as it would be helpful for her to have the CPAP before surgery  - Scheduled for bariatric surgery at [facility] (11/08/22): AHI 9.6, hypoxia nadir 79%  - Normal TSH &  free T4 (10/04/22)  - HgA1c 6.2 (08/17/22)  -     HgA1c, BMP, hepatic function panel, TSH    Fatigue  -     Suspect due to untreated OSA  -     TSH; CBC with diff    Moderate persistent asthma, with one episode of status asthmaticus (Nov-2023)  -     Continue albuterol 1-2 puffs every 4-6 hrs as needed  - Continue Symbicort 160-4.5 two puffs BID  - Continue montelukast 10 mg nightly  - Continue fexofenadine every morning  -     Continue pulm & allergy follow-up    Gastroesophageal reflux disease without esophagitis  -     Continue omeprazole 40 mg daily  -     Referred to [facility], had EGD performed at [facility]  - EGD (01/19/23, [facility]) Gastric biopsy notable only for reactive gastropathy, negative for H pylori; Duodenal biopsy notable only for patchy mild increase in intraepithelial lymphocytes and preserved villous architecture (mild, nonspecific findings, possibly c/w Celiac, H pylori, drug-indueced injury, petpic injury, and obesity)    Health Maintenance:  Cancer Screening  - Benign Pap, no HR-HPV (12/12/21)  - Normal mammogram (09/11/22)  Vaccinations  - Covid-19 Updated 11/06/22  - Seasonal influenza, 11/06/22  - Tdap 05/17/17    I spent 36 minutes in the room with the patient and on pre-/post-visit clinical work on the calendar  day.    Follow up: 4 mo, earlier prn  ______________________  Glade Stanford MD

## 2023-02-22 LAB — RAST INTERPRETATION

## 2023-02-22 LAB — IGE: IgE: 110 kU/L (ref 0–158)

## 2023-02-22 LAB — ALLERGEN CRAB IGE: Crab IgE: <0.1 kU/L

## 2023-02-22 LAB — HEMOGLOBIN A1C: Hemoglobin A1C: 6.2 % — ABNORMAL HIGH

## 2023-02-22 LAB — ALLERGEN OYSTER IGE: Oyster IgE: <0.1 kU/L

## 2023-02-22 LAB — ALLERGEN SCALLOP IGE: Scallop IgE: <0.1 kU/L

## 2023-02-22 LAB — ALLERGEN SHRIMP: Shrimp IgE: 0.1 kU/L

## 2023-02-22 LAB — ALLERGEN LOBSTER: Lobster IgE: <0.1 kU/L

## 2023-02-22 LAB — ALLERGEN CLAMS: Clam IgE: <0.1 kU/L

## 2023-02-23 ENCOUNTER — Telehealth: Payer: Self-pay

## 2023-02-26 ENCOUNTER — Encounter: Payer: Self-pay | Admitting: Physical Medicine and Rehabilitation

## 2023-02-27 NOTE — Telephone Encounter (Signed)
Spoke with patient's husband regarding the FMLA paperwork.  He said he is asking for the FMLA so that he can take care of his wife stating that from her hospitalization in January he was told that "this could happen again" and he feels he needs to be there to make sure she is taken to the hospital.  Writer explained that FMLA paperwork cannot be completed unless the patient has a current acute issue and need for care.  Writer stated that according to Dr. Sharon Mt is stable and does not qualify for a need to be taken care of.  Patient's husband disagrees and is upset over this decision.  Writer stated she would share this information with Dr. Fransico Setters.  Husband stated that he needs to be in [place] to take care of his family.

## 2023-03-01 ENCOUNTER — Telehealth: Payer: Self-pay | Admitting: Internal Medicine

## 2023-03-01 NOTE — Progress Notes (Signed)
Writer was asked to speak with patient and her husband regarding the FMLA paperwork.  Patient came in without an appointment and was demanding to see Dr. Fransico Setters and Clinical research associate. Victorino Dike, the Diplomatic Services operational officer, stated Clinical research associate was busy and Dr. Fransico Setters was seeing patients.  Victorino Dike did not know that Dr. Fransico Setters had left the office.  Patient stated to Victorino Dike that if she doesn't see Dr. Fransico Setters or writer that she will kill herself.  Patient was demanding that the FMLA paperwork needs to be completed.  Due to writer speaking to patient's husband on the phone recently to tell him that Dr. Fransico Setters is not going to complete the FMLA paperwork, he became angry.  Writer asked the Engineer, manufacturing to join her when meeting with patient and her husband.      During the meeting patient's husband Baldo Ash stated that he needed this paperwork to be completed so that he does not have to return to [place].  Stated that patient needs help at home.  He went on to explain that Dr. Fransico Setters completed this paperwork when patient was in the hospital.  At that time patient needed assistance at home, but patient is now independent.  Dr. Fransico Setters was not in the office at this time.      Writer explained again that Dr. Fransico Setters stated she would not complete the paperwork because patient no longer needs the care.  Patient then threatened to kill herself.  Writer asked by what means and patient stated "drown herself".  Baldo Ash continued to state that he needs the FMLA paperwork to take care of his family.  Writer did tell patient that she will need to see the physician who manages the psychiatric admission and Mental Health Arrests.  Writer met with Dr. Brooke Dare who stated as long as patient is not resistive she can go to [facility] independently for an evaluation.  Writer returned and told patient that we can call and ambulance to take her, or she can go with her husband to [facility].  They opted to go to [facility] themselves.  During this time patient was very calm, and was not  demonstrating any distress.      Husband insisted on getting the [facility] paperwork.  Writer made a copy and gave it to the husband.

## 2023-03-01 NOTE — Telephone Encounter (Signed)
Telephone Call:    Called patient.  She has been having SI for the past month.  She reports that the thoughts of SI occur daily.     Safety planning performed.  She does not have accompanying physical symptoms that warn her of the thoughts of self harm.  She just feels overwhelmed.  She reports that the only activity she can do to distract herself when she feels this way is walk.  Explored some other activities for distracting herself when she has thoughts of self harm.  Asked who her support is when she has these thoughts and she listed Baldo Ash, no other support folks identified.  Discussed professional resources for help: [employer], 211, and our office.    Discussed prior plans for self harm.  No gun in the home.  Has a pool in her house.  No current plans for self harm at this moment.  She is currently scheduled for bariatric surgery, discussed that now may not be the ideal time for an elective surgery given her levels of stress.    Discussed FMLA paperwork with husband.  He currently helps with IADLs (cooking, cleaning, laundry, child rearing).  He primarily wants to be available for monitoring given her hospitalization requiring intubation in late November.  Discussed that can complete the [facility] paperwork stating that she may have flares that would require assistance.  Discussed that I agree that he should be able to be at home with his family -- just that I was concerned that FMLA was not the right type of paperwork and that I may not be able to make a strong enough case.  Baldo Ash understands.    Glade Stanford MD

## 2023-03-01 NOTE — Telephone Encounter (Signed)
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      The worst level of central canal stenosis is at the L4-L5 level.      Multiple levels of moderate to severe neuroforaminal narrowing, most prominent at the bilateral L5-S1 levels.      END OF IMPRESSION      I have personally reviewed the images and the Resident's/Fellow's interpretation and agree with or edited the findings.         We reviewed the findings in general.  She has more questions and I advised her to follow-up to schedule follow-up appointment so we can go over the images in detail and I can answer all of her questions in full at that time    I will direct my office to contact her and set up a follow-up appointment

## 2023-03-01 NOTE — Telephone Encounter (Signed)
Telephone Call:      Called patient [phone]).  Left voicemail without detail as patient's name was not on the voicemail.  Called patient's husband [phone]).  Left voicemail stating that I was trying to speak with him and Lakshmi and that I would call back.    Glade Stanford MD (17:30 pm)

## 2023-03-02 NOTE — Progress Notes (Deleted)
[facility] Follow-Up Note      [facility] was seen in follow-up today at the [facility] for asthma.  She was last seen here on December 06, 2022 with Marilynn Rail, NP.     At the last visit she was having increased symptoms and was placed on prednisone 40 mg for 5 days.  She was instructed to continue her Symbicort 160/4.5 mcg inhaler 2 puffs twice a day along with her albuterol as needed.  She was instructed to take DuoNeb via nebulization up to 4 times a day as needed.  She was instructed to follow-up with allergy as planned.  She was in the process of getting CPAP for OSA.      Interm History     In reviewing her history    Prednisone:  since last visit ****  Antibiotics:    She is planning on having a gastric bypass surgery done at [facility] on *****    She had a home sleep study done November 08, 2022 which showed mild obstructive sleep apnea (uAHI 9.6,saturation nadir of 79%).  She is on an auto titrating CPAP.  ****  She feels rested when she wakes up in the morning.   ****        COVID vaccination: Received and is up-to-date.  Flu vaccination: Received and is up-to-date.  Pneumonia vaccination: Has not received.  ***    ROS:    Cough:    Mucous:   Hemoptysis: Denies  Shortness of breath:   Rest:   Walking flat ground:               Exertional activities:               Carrying groceries / other items:                Showering and dressing:    Chest tightness: Denies  Chest pain: Denies  Wheezing:   Nocturnal symptoms: Denies     Sinus/PND:    Fluticasone nasal spray 2 squirts each nostril daily  Allergy symptoms:  Fexofenadine 180 mg daily.  Loss of Smell:  Heartburn:   Omeprazole 40 mg before breakfast.  Fevers: Denies  Chills/night sweats:  Denies  Unwanted weight loss: Denies  Appetite:   Exercise:    Edema:  Denies    OSA/CPAP: She wears CPAP on a nightly basis.  ***  Oxygen: NA        Asthma Control Test:       (Score of 20 or higher represents good control.)         Urgent  Care Visits: Denies r/t breathing issues since the last visit.   ED Visits / Hospitalizations: Denies r/t breathing issues since the last visit.     A complete medication list was reviewed and updated with the patient.    Medications     Current Outpatient Medications   Medication    omeprazole (PRILOSEC) 40 mg capsule    fexofenadine (ALLEGRA) 180 mg tablet    EPINEPHrine (EPIPEN) 0.3 mg/0.3 mL auto-injector    budesonide-formoterol (SYMBICORT) 160-4.5 MCG/ACT inhaler    montelukast (SINGULAIR) 10 mg tablet    albuterol HFA (PROVENTIL, VENTOLIN, PROAIR HFA) 108 (90 Base) MCG/ACT inhaler    fluocinolone 0.01% scalp (DERMA-SMOOTHE/FS) 0.01 % OIL external oil    ketoconazole (NIZORAL) 2 % shampoo    olopatadine (PATADAY) 0.2 % ophthalmic solution    meloxicam (MOBIC) 7.5 mg tablet  No current facility-administered medications for this visit.     Pertinent pulmonary medications include: ***    Albuterol as a rescue that she takes **  Nebulizer -   Symbicort 160/4.5 mcg inhaler 2 puffs twice a day.  Montelukast 10 mg at bedtime.      Allergic reactions were reviewed and updated.    A ROS was performed and pertinent findings include those mentioned above.     Family History     Family History   Problem Relation Age of Onset    Allergic rhinitis Mother     Diabetes Mother     Breast cancer Mother     Stroke Mother     Heart failure Mother     Diabetes Father     High Blood Pressure Father     Cataracts Father     Sickle cell trait Father     Anemia Father     Breast cancer Maternal Aunt     Alzheimer's disease Maternal Aunt     Diabetes Maternal Uncle     Stroke Maternal Uncle     High Blood Pressure Maternal Grandmother     Pancreatic Cancer Maternal Grandfather     Diabetes Paternal Grandmother     Autism Son     Colon cancer Neg Hx     Thyroid cancer Neg Hx          Social History       Works: Secretary/administrator  Pets: 1 dog and is allergic on ImmunoCAP testing  Smoking history: Never smoker  Vaping:  Denies  Alcohol  intake: Occasional  Substance abuse: Denies      Exam     There were no vitals taken for this visit.    Gen:   Pleasant 42 y.o. female in NAD  HEENT: Anicteric, oropharynx clear, without thrush, no sinus tenderness to palpation.  Neck:  No cervical or supraclavicular lymphadenopathy. No thyromegaly.    Resp:  ***   CV:    Regular S1S2,  no murmur,  no pedal edema   GI:  Abd. soft, NT/ND.   Msk: No cyanosis, no clubbing    Neuro: Alert and oriented X 3 without focal findings    Results     The following studies were personally reviewed.     PFT's  Date FVC (%)  FEV1 (%)  FEV1/FVC (%)   Diffusion (%)   12/13/2022 3.04 L (98%) 2.50 L (98%) 82 (100%)    10/10/2022 3.27 L (105%) 2.74 L (107%) 84 (102%) 17.20 (81%)     Spirometry not done at today's visit.      Exhaled Nitric Oxide:    Date  Result (ppb) Interpretation (Inflammation)   12/13/2022 25 Intermediate   10/10/2022 47 Intermediate       Imaging     The following studies were personally reviewed.      12/06/2022 10:19 AM  CHEST X-RAYS    CLINICAL INFORMATION: abnormal lung sounds on exam, current URI-eval for PNA, J45.40-Moderate persistent asthma, uncomplicatedR06.2-WheezingR05.9-Cough, unspecified.    COMPARISON: 10/24/2022    PROCEDURE: Frontal and lateral projections of the chest were obtained.    FINDINGS:    Tubes and Catheters: None.  Central Airways: Normal.  Lungs: Normal.  Pleura/Pleural Bullock: Normal.  Heart and Mediastinum: Normal.  Additional Findings: No acute or aggressive osseous changes noted.    Impression     No acute cardiopulmonary disease.    END OF IMPRESSION  Labs         Lab results: 02/21/23  1554   WBC 8.0   Hemoglobin 14.8   Hematocrit 45   RBC 5.4   Platelets 355           Lab results: 02/21/23  1554   Sodium 140   Potassium 4.3   Chloride 104   CO2 22   UN 13   Creatinine 0.79   Glucose 83   Calcium 9.3   Total Protein 7.2   Albumin 4.5   ALT 17   AST 19   Alk Phos 57   Bilirubin,Total 0.3           Latest Ref Rng & Units 02/21/2023      3:54 PM 10/10/2022    12:29 PM   AMB ALLERGY RAST REVIEW   Alternaria Tenuis IgE kU/L  <0.10    A.fumigatus IgE kU/L  <0.10    Cat Epi/Dander IgE kU/L  0.19    Cladosporium IgE kU/L  <0.10    Clam IgE kU/L <0.10     Cockroach,German IgE kU/L  <0.10    Crab IgE kU/L <0.10     DF Mites IgE kU/L  0.13    DP Mites IgE kU/L  0.12    Grass,[place] Blue IgE kU/L  2.54    Grass,Orchard IgE kU/L  1.92    IgE 0 - 158 kU/L 110  94    Lamb's Quarters IgE kU/L  <0.10    Lobster IgE kU/L <0.10     Oyster IgE kU/L <0.10     Ragweed,Short IgE kU/L  3.21    Scallop IgE kU/L <0.10     Shrimp IgE kU/L 0.10     Tree,White Ash IgE kU/L  0.37    Tree,Birch IgE kU/L  0.23    Tree,Box Elder/Maple IgE kU/L  <0.10    Tree,Elm IgE kU/L  0.32    Tree, Oak IgE kU/L  3.43          01/04/2023  2:00 PM    Allergy Aeroallergen Testing     Type of testing performed: Percutaneous    (+)   Histamine     10mg /ml     HS 7/15    (-)   0.9% NaCl/   0.03%Alb/   0.4% Phenol 0/0    Cat      10,000BAU     HS 0/0    Dog 1:20   HS 5/10    Mouse    1:20    G 0/0    D.f. Mite     10,000AU     G 0/0    D.p. Mite     10,000AU     G 10/35    Roach     1:20     G 0/0    Pine, white/Eastern     1:20     G 0/0    Elm, Chinese 1:20 HS 0/0    [place], [place].     1:20     HS 0/0    Poplar, cottonwood     1:20     G 0/0    Sugar Maple     1:20     G 0/0    Oak, white     1:20     G 10/40    Ash, white     1:20     G 5/20    Cedar, red     1:20  G 0/0    Willow, black     1:20     G 0/0    Birch, red/river     1:20     G 4/9    Hickory, shagbark     1:20     G 3/5    Black Walnut     1:20     G 0/0    Sycamore     1:20     G 0/0    Grass Mix # 5     100,000BAU     G 10/30    Johnson Grass     1:20     G 9/28    Sheep Sorrel     1:20     G 0/0    Plantain     1:20     G 4/6    Smooth Brome     1:20     G 7/35    Yellow Dock     1:20     G 0/0    Amaranths     1:20     G 5/20    Artemesia     1:20     G 0/0    Chenopodium     1:20     G 0/0    Cocklebur     1:20     G 0/0     Kochia     1:20     G 0/0    Ragweed     1:20     G 0/0    Alternaria Alternata   1:10    HS 0/0    Bipolaris Sorokiniana   1:10    HS 0/0    Hormodendrum Cladospor.   1:10    HS 0/0    Aspergillus Mix   1:10    HS 0/0    Aspergillus Fumigatus   1:10    HS 0/0    Penicillium Mix   1:10    HS 0/0      Skin test interpretation: Percutaneous testing to a panel of common aeroallergens was positive to dog, dust mite, trees, grasses, and amaranths.           01/04/2023  2:00 PM   Food Testing Review Flowsheet     Type of testing performed: Percutaneous    (+)   Histamine     10mg /ml     HS 7/15    (-)   0.9% NaCl/   0.03%Alb/   0.4% Phenol 0/0    Milk 0/0    Egg     (White) 0/0    Shrimp 4/6    Crab 0/0    Lobster 0/0    Clam 0/0    Scallop 0/0    Oyster 0/0    Cinnamon 0/0      Positive to shrimp.     Date Eosinophil number   02/21/2023 600   10/24/2022 200   08/15/2021 600     Active Problem List     Patient Active Problem List   Diagnosis Code    Chronic midline low back pain without sciatica M54.50, G89.29    Class 1 obesity E66.9    Pre-diabetes R73.03    OSA (obstructive sleep apnea) G47.33    Gastroesophageal reflux disease without esophagitis K21.9    Moderate persistent asthma without complication J45.40       Impression / Plan     Theresa [facility] is a 42  y.o. female never smoker with history of asthma and allergies     ***          She is aware to contact the office if there are any questions or concerns.    I personally spent *** minutes on the calendar day of the encounter, including pre and post visit work.  {Billing Activities:[id number]} were personally reviewed and discussed with the patient.     Thank you for allowing [place] to remain involved in the care of your patient. Please don't hesitate to contact us with any questions or concerns.           Sincerely,   Drenda Freeze, FNP-C    Electronically signed by Durward Fortes, NP 03/06/2023 1:54 PM    Dictated using Dragon voice recognition software.

## 2023-03-06 ENCOUNTER — Ambulatory Visit: Payer: PRIVATE HEALTH INSURANCE | Admitting: Pulmonary Disease

## 2023-03-08 ENCOUNTER — Other Ambulatory Visit: Payer: PRIVATE HEALTH INSURANCE

## 2023-03-09 ENCOUNTER — Telehealth: Payer: Self-pay

## 2023-03-09 NOTE — Telephone Encounter (Signed)
Writer left a message for Theresa [facility]ock to inform him that the form was faxed and provided the fax number [phone].  Writer mentioned that Dr. Fransico Setters received request for clarification.

## 2023-03-27 ENCOUNTER — Other Ambulatory Visit: Payer: PRIVATE HEALTH INSURANCE

## 2023-05-07 ENCOUNTER — Ambulatory Visit: Payer: PRIVATE HEALTH INSURANCE | Admitting: Internal Medicine

## 2023-06-18 NOTE — Progress Notes (Unsigned)
[facility] Follow-Up Note      [facility] was seen in follow-up today at the [facility] for asthma.  She was last seen here on December 06, 2022 with Theresa Rail, NP.        At the last visit she had wheezing on exam and was placed on prednisone 40 mg for 5 days.  She was also sent for chest x-ray and  she was instructed to take DuoNeb via nebulization 4 times a day as needed.  She was scheduled the following day for an allergy evaluation.  She was in the process of getting fitted for CPAP machine at her last visit.    Interm History     In reviewing history from the last visit she reports that she stopped the medications and has been off them since March.  She denies having any wheezing or SOB related to her asthma but feels any dyspnea is more due to weight.  She admitted that she has never taken the Symbicort on a regular basis.  She admitted that part of the reason she did not take it regularly was that it caused her to have vertigo.  She has not needed to be on oral steroids since her last exacerbation when she was last seen.  She has not had any upper respiratory infections requiring antibiotics.    She had a home sleep study done November 08, 2022 which showed mild obstructive sleep apnea.  She was started on CPAP that she wears on a nightly basis.  She does feel rested in the morning when she wakes up.  She is followed by the sleep center at [facility].    She was supposed to have bariatric surgery last month but it had to be rescheduled due to insurance and now needs an updated pulmonary clearance letter.  She reports that her breathing test from March was okay to use.  She is hoping to have the surgery in November/December timeframe.   She will be having bariatric surgery at [facility]   Theresa [facility] is the surgical coordinator for Dr. Ren-Fielding.  Their telephone number is  [phone] and the fax number is [phone].     COVID vaccination:  Received  Flu vaccination: Due  for this season.    Pneumonia vaccination:  Has not received     ROS:    Pertinent positives checked below:      []  cough     []  sputum production  color:   consistency:  amount:    []  shortness of breath    []  wheezing  []  chest tightness  []  chest pain  []  nocturnal symptoms   []  fever/chills   [x]  + allergies / - PND / + sinus congestion  -takes fexofenadine 180 mg daily as needed  Pataday eyedrops 1 drop each eye daily as needed  []  sore throat  []  loss of smell  []  appetite  []  unwanted weight loss  [x]  + heartburn / + acid reflux  - takes omeprazole 40 mg before breakfast more or less  [x]  exercise - walking 3 times a week for about 30 minutes.  []  LE swelling  []  palpitations  [x]  OSA/CPAP - on CPAP that she wears on a nightly basis  []  Oxygen      []  none of the above     Asthma Control Test:   In the past 4 weeks, how much of the time did your asthma keep you from getting as  much done at work, school, or home?: 5-None of the time  During the past 4 weeks, how often have you had shortness of breath?: 5-Not at all  During the past 4 weeks, how often did your asthma symptoms wake you up at night or earlier than usual in the morning?: 5-not at all  During the past 4 weeks, how often have you used your rescue inhaler or nebulizer medication(such as albuterol)?: 5-not at all  How would you rate your asthma control during the past 4 weeks?: 4-Well controlled  ACT SCORE 12 and OLDER: 24 which represents good control.  (Score of 20 or higher represents good control.)     Urgent Care Visits: Denies r/t breathing issues since the last visit.   ED Visits / Hospitalizations: Denies r/t breathing issues since the last visit.     A complete medication list was reviewed and updated with the patient.    Medications     Current Outpatient Medications   Medication    omeprazole (PRILOSEC) 40 mg capsule    fexofenadine (ALLEGRA) 180 mg tablet    EPINEPHrine (EPIPEN) 0.3 mg/0.3 mL auto-injector    budesonide-formoterol  (SYMBICORT) 160-4.5 MCG/ACT inhaler    montelukast (SINGULAIR) 10 mg tablet    albuterol HFA (PROVENTIL, VENTOLIN, PROAIR HFA) 108 (90 Base) MCG/ACT inhaler    fluocinolone 0.01% scalp (DERMA-SMOOTHE/FS) 0.01 % OIL external oil    ketoconazole (NIZORAL) 2 % shampoo    olopatadine (PATADAY) 0.2 % ophthalmic solution    meloxicam (MOBIC) 7.5 mg tablet     No current facility-administered medications for this visit.     Pertinent pulmonary medications include:     Albuterol inhaler as a rescue that she has not needed to take since March.    Levalbuterol via nebulizer as needed that she has not taken since March.    Symbicort 160/4.5 mcg inhaler 2 puffs twice a day as needed  Montelukast 10 mg at bedtime which she stopped and has not taken since June.       Allergic reactions were reviewed and updated.    A ROS was performed and pertinent findings include those mentioned above.     Family History     Family History   Problem Relation Age of Onset    Allergic rhinitis Mother     Diabetes Mother     Breast cancer Mother     Stroke Mother     Heart failure Mother     Diabetes Father     High Blood Pressure Father     Cataracts Father     Sickle cell trait Father     Anemia Father     Breast cancer Maternal Aunt     Alzheimer's disease Maternal Aunt     Diabetes Maternal Uncle     Stroke Maternal Uncle     High Blood Pressure Maternal Grandmother     Pancreatic Cancer Maternal Grandfather     Diabetes Paternal Grandmother     Autism Son     Colon cancer Neg Hx     Thyroid cancer Neg Hx      Social History     Works: Charity fundraiser - travel Engineer, civil (consulting)  Pets:  One dog  Smoking history: Never smoker  Vaping:  Denies  Alcohol intake: Occasional  Substance abuse: Denies    Exam     Blood pressure 134/85, pulse 68, height 1.626 m (5\' 4" ), weight 98.9 kg (218 lb), SpO2 99%. ra    Gen:  Pleasant 42 y.o. female in NAD  HEENT: Anicteric, oropharynx clear, without thrush, no sinus tenderness to palpation.  Neck:  No cervical or supraclavicular  lymphadenopathy. No thyromegaly.    Resp:  LCTA without any wheeze, rhonchi or crackles and were clear with forced exhalation.   CV:    Regular S1S2,  no murmur,  no pedal edema   GI:  Abd. soft, NT/ND.   Msk: No cyanosis, no clubbing    Neuro: Alert and oriented X 3 without focal findings    Results     The following studies were personally reviewed.     PFT's  Date FVC (%)  FEV1 (%)  FEV1/FVC (%)   Diffusion (%)   12/13/2022 3.04 L (98%) 2.50 L (98%) 82 (100%)    10/10/2022 3.27 L (105%) 2.74 L (107%) 84 (102%) 17.20 (81%)     Spirometry not done at today's visit.    Exhaled Nitric Oxide:    Date  Result (ppb) Interpretation (Inflammation)   12/13/2022 25 Intermediate   10/10/2022 47   intermediate     Imaging     The following studies were personally reviewed.      Narrative   12/06/2022 10:19 AM  CHEST X-RAYS    CLINICAL INFORMATION: abnormal lung sounds on exam, current URI-eval for PNA, J45.40-Moderate persistent asthma, uncomplicatedR06.2-WheezingR05.9-Cough, unspecified.    COMPARISON: 10/24/2022    PROCEDURE: Frontal and lateral projections of the chest were obtained.    FINDINGS:    Tubes and Catheters: None.  Central Airways: Normal.  Lungs: Normal.  Pleura/Pleural space: Normal.  Heart and Mediastinum: Normal.  Additional Findings: No acute or aggressive osseous changes noted.    Impression     No acute cardiopulmonary disease.    END OF IMPRESSION     Labs         Lab results: 02/21/23  1554   WBC 8.0   Hemoglobin 14.8   Hematocrit 45   RBC 5.4   Platelets 355         Lab results: 02/21/23  1554   Sodium 140   Potassium 4.3   Chloride 104   CO2 22   UN 13   Creatinine 0.79   Glucose 83   Calcium 9.3   Total Protein 7.2   Albumin 4.5   ALT 17   AST 19   Alk Phos 57   Bilirubin,Total 0.3         Latest Ref Rng & Units 02/21/2023     3:54 PM 10/10/2022    12:29 PM   AMB ALLERGY RAST REVIEW   Alternaria Tenuis IgE kU/L  <0.10    A.fumigatus IgE kU/L  <0.10    Cat Epi/Dander IgE kU/L  0.19    Cladosporium IgE kU/L   <0.10    Clam IgE kU/L <0.10     Cockroach,German IgE kU/L  <0.10    Crab IgE kU/L <0.10     DF Mites IgE kU/L  0.13    DP Mites IgE kU/L  0.12    Grass,[place] Blue IgE kU/L  2.54    Grass,Orchard IgE kU/L  1.92    IgE 0 - 158 kU/L 110  94    Lamb's Quarters IgE kU/L  <0.10    Lobster IgE kU/L <0.10     Oyster IgE kU/L <0.10     Ragweed,Short IgE kU/L  3.21    Scallop IgE kU/L <0.10     Shrimp IgE kU/L 0.10  Tree,White Ash IgE kU/L  0.37    Tree,Birch IgE kU/L  0.23    Tree,Box Elder/Maple IgE kU/L  <0.10    Tree,Elm IgE kU/L  0.32    Tree, Oak IgE kU/L  3.43      Percutaneous testing (01/04/2023) to a panel of common aeroallergens was positive to dog, dust mite, trees, grasses, and amaranths.      Date Eosinophil number   02/21/2023 600   10/24/2022  200   08/15/2021 600     Active Problem List     Patient Active Problem List   Diagnosis Code    Chronic midline low back pain without sciatica M54.50, G89.29    Class 1 obesity E66.9    Pre-diabetes R73.03    OSA (obstructive sleep apnea) G47.33    Gastroesophageal reflux disease without esophagitis K21.9    Moderate persistent asthma without complication J45.40     Impression / Plan     Theresa Bullock is a 42 y.o. female never smoker with a history of asthma and allergies who was able to get back to baseline after her last exacerbation in March.  She has not taken any asthma medication since March and has remained stable.  She denies having any asthma symptoms and has not had any exacerbations since her last visit.  She is presenting today for follow-up.    With regard to her upcoming surgery, there are no contraindications to the planned surgery as long as her asthma symptoms remained controlled.  She has an 0.2% estimated risk of postoperative respiratory failure using the [facility] Respiratory Risk Calculator.  Her spirometry numbers in March were normal.  She may require around the clock nebulization with bronchodilators in the immediate perioperative period.  As  always she will benefit from early ambulation along with coughing, deep breathing and incentive spirometer use to prevent complications like pneumonia.    To maximize her readiness for surgery I suggested taking the Symbicort 160/4.5 mcg inhaler at 1 puff once a day for a couple weeks and if she did not have any difficulty with the vertigo that she can increase to 2 puffs once a day and stay on this dose.  I told her if she felt that she needed to use her rescue inhaler to use the Symbicort instead and she could take 1 or 2 puffs extra if needed.  If she had any problems with the dizziness I have instructed her to contact the office.  She was instructed she can remain off the montelukast.  No other medication changes were made.  She will follow-up in 6 months with spiro and ENO sooner if needed.    She is aware to contact the office if there are any questions or concerns.    I personally spent 30 minutes on the calendar day of the encounter, including pre and post visit work.  Patient chart and history were personally reviewed and discussed with the patient.     Thank you for allowing [place] to remain involved in the care of your patient. Please don't hesitate to contact us with any questions or concerns.     Sincerely,   Drenda Freeze, FNP-C    Electronically signed by Durward Fortes, NP 06/19/2023 4:49 PM    Dictated using Dragon voice recognition software.

## 2023-06-19 ENCOUNTER — Other Ambulatory Visit: Payer: Self-pay

## 2023-06-19 ENCOUNTER — Ambulatory Visit: Payer: Medicaid Other | Attending: Pulmonology | Admitting: Pulmonary Disease

## 2023-06-19 ENCOUNTER — Telehealth: Payer: Self-pay | Admitting: Pulmonology

## 2023-06-19 ENCOUNTER — Encounter: Payer: Self-pay | Admitting: Pulmonary Disease

## 2023-06-19 VITALS — BP 134/85 | HR 68 | Ht 64.0 in | Wt 218.0 lb

## 2023-06-19 DIAGNOSIS — J4540 Moderate persistent asthma, uncomplicated: Secondary | ICD-10-CM

## 2023-06-19 DIAGNOSIS — G4733 Obstructive sleep apnea (adult) (pediatric): Secondary | ICD-10-CM

## 2023-06-19 NOTE — Telephone Encounter (Signed)
The patient at checkout stated they would like to be scheduled with Dr. Vella Redhead for there 1 year Follow up visit.     Follow-up disposition: Follow up in about 6 months (around 12/17/2023) for Next visit with Meg, With spiro, With ENO.  Check out comments: 12 months with  Dr. Maureen Chatters      The Patient is now scheduled with Dr Maureen Chatters.  Please advise.

## 2023-06-19 NOTE — Telephone Encounter (Signed)
Should be with Dr. Vella Redhead since that is who she sees.  That was my error and she should be scheduled with Dr. Vella Redhead not Dr. Maureen Chatters.  Thanks.

## 2023-06-19 NOTE — Patient Instructions (Addendum)
Take Symbicort 160/4.5 mcg inhaler at 1 puff once a day- every day and if you are not have any problems with dizziness then increase to 2 puffs once a day then take the additional dose if you are having symptoms or need to use your rescue inhaler.  Use the Symbicort as the rescue inhaler.    Can remain off the montelukast.  No other medication changes.  Follow up in 6 months with spiro and ENO   VIEW YOUR HEALTH INFORMATION ONLINE TODAY!    Below is your activation code to sign up for MyChart, [facility]'s free website that allows you to view most test results, send and receive messages from your doctor, renew your prescriptions, request an appointment, and much more!    How Do I Sign Up?    Important:  You must have an email address to sign up for MyChart.    Go to [email or web address]  Click on the Sign Up (I have a code) link under "New User?" located in the blue box on the left hand side of the screen.    Enter your MyChart Activation Code: Activation code not generated  Current [place] MyChart Status: Active  Enter your Date of Birth, Zip Code and Phone Number, and click NEXT.  Continue following the instructions to complete your MyChart sign up.    You also may want to write down your Username and Password below:    MY USERNAME:  _______________________________________________    [facility] PASSWORD: _______________________________________________    Additional Information    If you are not the patient, but you are involved in the health care of this patient, DO NOT USE THIS CODE!  Instead, go to the [employer] Product/process [email or web address]) and click on "Access for My Kids/Family/Friends" below the username and password fields.    Questions?    Call our MyChart Customer Service Center, 8 a.m. to 5 p.m.   ZOXWRUEA: [phone]; choose Option 1

## 2023-06-29 NOTE — Telephone Encounter (Signed)
Letter mailed with appointment information

## 2023-07-24 LAB — UNMAPPED LAB RESULTS
Basophil # (HT): 0.1 10 3/uL (ref 0.0–0.2)
Basophil % (HT): 1 % (ref 0–2)
Eosinophil # (HT): 0.5 10 3/uL (ref 0.0–0.5)
Eosinophil % (HT): 7 % (ref 0–7)
Hematocrit (HT): 43 % (ref 34–47)
Hemoglobin (HGB) (HT): 14.1 g/dL (ref 11.5–16.0)
Lymphocyte # (HT): 2.8 10 3/uL (ref 0.9–3.8)
Lymphocyte % (HT): 40 % (ref 17–44)
MCHC (HT): 33.2 g/dL (ref 32.0–36.0)
MCV (HT): 85.3 fL (ref 81.0–99.0)
Mean Corpuscular Hemoglobin (MCH) (HT): 28.3 pg (ref 26.0–34.0)
Monocyte # (HT): 0.6 10 3/uL (ref 0.2–1.0)
Monocyte % (HT): 8 % (ref 4–12)
Neutrophil # (HT): 3 10 3/uL (ref 1.5–7.7)
Platelets (HT): 315 10 3/uL (ref 150–450)
RBC (HT): 4.98 10 6/uL (ref 3.80–5.20)
RDW (HT): 12.6 % (ref 11.5–15.0)
Seg Neut % (HT): 43 % (ref 40–75)
WBC (HT): 6.9 10 3/uL (ref 4.0–10.8)

## 2023-08-06 ENCOUNTER — Telehealth: Payer: Self-pay | Admitting: Allergy/Immunology/Rheumatology

## 2023-08-06 ENCOUNTER — Telehealth: Payer: Self-pay

## 2023-08-06 NOTE — Telephone Encounter (Signed)
Copied from CRM #[id number]. Topic: Access to Care - Speak to Provider/Office Staff  >> Aug 06, 2023 12:16 PM Kyla Balzarine B wrote:  Theresa Bullock, patient, is calling to get set up for Allergy Injections.     Please call patient back at [phone] to discuss.

## 2023-08-06 NOTE — Telephone Encounter (Signed)
Pt would like to start getting injections as was discussed at 01/04/23 visit. Pt would like to receive injection at [place] or [place]. Thanks!

## 2023-08-07 ENCOUNTER — Ambulatory Visit: Payer: BLUE CROSS/BLUE SHIELD | Admitting: Physical Medicine and Rehabilitation

## 2023-08-07 ENCOUNTER — Other Ambulatory Visit: Payer: Self-pay

## 2023-08-07 ENCOUNTER — Encounter: Payer: Self-pay | Admitting: Physical Medicine and Rehabilitation

## 2023-08-07 VITALS — BP 122/78 | Ht 64.02 in | Wt 221.0 lb

## 2023-08-07 DIAGNOSIS — M545 Low back pain, unspecified: Secondary | ICD-10-CM

## 2023-08-07 NOTE — Progress Notes (Signed)
ID: Theresa Bullock is a 42 y.o. year old  bilateral lumbo sacral  pain     CC:  Chief Complaint   Patient presents with    Spine - Follow-up     HPI:  began in pregnancy last one 2018;  slowly worse     SYMPTOMS LOCATION: as above     SEVERITY: variable unbearable;  20 lf 10 like labor     EXACERBATING FACTOR: standing , bending , sweekping j    ALLEVIATING FACTORS: sitting , leaning forward.     EVALUATION AND WORK UP: xray has been done. 2022     TREATMENTS: in past they ordered some meloxicam.  It helps but makes her drowsi.  No other meds.    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        ===========================================    January 16 2023;  Still with low back pain . Not major leg pain      August 07 2023;    Continued pain  with mopping and lifting     Medical History:  Past Medical History:   Diagnosis Date    Anemia 02/16/1980    Sickle Cell Anemia Trait    Asthma 08/17/22    Obesity     Pre-diabetes     Sickle cell trait        Surgical History:  Past Surgical History:   Procedure Laterality Date    abdominoplasty      liposuction & abdominoplasty c/b cellulitis (2020)             ZOX:WRUEAV of Systems , 10 systems reviewed all normal  .         Allergies:  Allergies   Allergen Reactions    Shellfish-Derived Products Anaphylaxis     Crab only. Tolerates shrimp, lobster, and crawfish.    Cinnamon Other (See Comments)     Tongue irritation     Crab (Diagnostic) Anaphylaxis        Vitals:    08/07/23 1309   BP: 122/78   Weight: 100.2 kg (221 lb)   Height: 1.626 m (5' 4.02")     Body mass index is 37.92 kg/m.  Physical Exam:    Observations: She is awake alert oriented clear cogent no acute distress.       Presentation:.  Timely fashion well-dressed well-groomed  Affect:.  Normal bright affable  Gait and station:.  Stable nonantalgic  Postures:.  Sitting posture normal standing station normal    Testing:.   They get up and out of a chair independently and without difficulty,  get up and onto an exam table independently and without difficulty.  Straight leg raise is negative.  There is no deformity of the cervical thoracic or lumbar spine to visual or palpatory inspection.  There is no tenderness over the subgluteal or trochanteric bursa. There is no atrophy of either upper or lower extremity.  Hip range of motion does not reproduce  symptoms.    Range of motion:.  Forward flexion hands to shins      Skin:.  There is no lymphadenopathy in the cervical, inguinal or axillary regions.  Skin is intact in all four limbs and in the trunk.  Tibial and radial artery pulses are intact bilaterally.  Lymph:Marland Kitchen  Pulses:.    Neuromuscular:Marland Kitchen    Motor Strength:.  Strength is normal  in the shoulder abductors, elbow flexors, wrist extensors and finger intrinsics bilaterally.  In the lower limbs strength is normal in the hip flexors, knee extensors, ankle dorsiflexors, and in the EHL bilaterally    Sensory Exam:.  Sensation is normal to light touch to all dermatomes in the upper and lower extremities bilaterally.    Reflexes:.  Reflexes are symmetric and normal at the biceps, triceps, brachioradialis, patellar tendon, and Achilles tendon bilaterally.  Toes are downgoing.  Romberg is negative.    Imaging:.  08/17/2021 10:31 AM     No evidence for fracture.      Minimal degenerative changes with facet arthropathy most prominent at the L5-S1 level.      END OF IMPRESSION     =  =  =  =              Other Diagnostics:.    Assessment:.            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      The worst level of central canal stenosis is at the L4-L5 level.      Multiple levels of moderate to severe neuroforaminal narrowing, most prominent at the bilateral L5-S1 levels.      END OF IMPRESSION      I have personally reviewed the images and the  Resident's/Fellow's interpretation and agree with or edited the findings.         Back pain   Degenerative disc at L4-5 S1   Facet arthropathy at L5S1       PLAN:  Begin PT and HEP insruction given

## 2023-08-10 ENCOUNTER — Encounter: Payer: Self-pay | Admitting: Gastroenterology

## 2023-08-10 ENCOUNTER — Other Ambulatory Visit: Payer: Self-pay | Admitting: Allergy/Immunology/Rheumatology

## 2023-08-10 DIAGNOSIS — J309 Allergic rhinitis, unspecified: Secondary | ICD-10-CM

## 2023-08-10 NOTE — Telephone Encounter (Addendum)
Copied from CRM [id number]. Topic: Appointments - Schedule Appointment  >> Aug 10, 2023  2:00 PM [name] wrote:  Theresa Bullock, Patient, is calling to schedule an Injection appointment. She said she is available any time from now up to 09/10/23 to schedule the injection.     Please return their call at [phone]

## 2023-08-10 NOTE — Telephone Encounter (Signed)
Conventional Build Up Protocol        Bottle #4  1:1000  (green)   Bottle #3  1:100  (blue)   Bottle #2  1:10  (yellow)   Bottle #1  1:1  (red)    cc's  cc's  cc's  cc's   Visit #:  Visit #:  Visit #:  Visit #:    1 0.05 7 0.05 13 0.05 19 0.05   2 0.1 8 0.1 14 0.1 20 0.1   3 0.2 9 0.2 15 0.2 21 0.2   4 0.3 10 0.3 16 0.3 22 0.3   5 0.4 11 0.4 17 0.4 23 0.4   6 0.5 12 0.5 18 0.5 24 0.5          0.5 at 2 weeks          0.5 at 3 weeks          0.5 monthly  (Maintenance)     Immunotherapy injection may be held at RN discretion if patient is ill or has any symptoms suggestive of illness (shortness of breath, wheezing, fever) or for peak flow < 80% of baseline.  Immunotherapy will not be given if antihistamine prescribed has not been taken within 24 hours.  If patient has forgotten their antihistamine as ordered prior to IT, cetirizine 10 mg may be given one time only and patient will have to wait 20-30 minutes prior to injection.    FOR MISSED DOSES IN ADMINISTRATION PROTOCOL:    BUILD UP:  Number of days since last injection:  • 7-14 days:  continue to build  • 15-21 days:  repeat dose  • 22-28 days:  decrease dose by 1 increment  • 29-35 days:  decrease dose by 2 increments  • Greater than 35 days:  check with patient’s allergist  MAINTENANCE:  (Drop back and return weekly until back on maintenance):  • 40 days or less since last injection:  repeat previous dose  • 41-48 days since last injection:  decrease by 1 dose increment  • 49-55 days since last injection:  decrease by 2 dose increments  • > 55 days since last injection:  check with patient’s allergist    FOR REACTIONS, CALL PROVIDER AND CONSIDER  THESE MEDICATIONS THAT CAN BE USED AS NEEDED:  ADULTS Pediatrics   Hydrocortisone cream 1% topically to affected area Hydrocortisone cream 1% topically to affected area   Benadryl 25 mg PO PRN itching (Adults or children > 25kg) Benadryl solution (12.5mg/5ml) 0.5mg/kg PO  (children < 25kg)   Cetirizine 10 mg PO PRN  itching (Adults or children > 30kg) Cetirizine solution (1mg/1ml) 5mg PO (children <30kg)   Epinephrine (Adults or children > 30 kg) 0.3mg IM Epinephrine (children >15kg and <30kg) 0.15mg IM  Epinephrine (children <15kg) 0.1mg IM   Prednisone per provider Prednisone per provider   Albuterol HFA 2 puffs  Albuterol HFA 2 puffs   Albuterol Nebulizer (2.5mg/3ml)  1 vial Albuterol Nebulizer (2.5mg/3ml) 1 vial   Duoneb Nebulizer 1 vial Duoneb Nebulizer 1 vial

## 2023-08-20 ENCOUNTER — Other Ambulatory Visit: Payer: Self-pay

## 2023-08-20 ENCOUNTER — Ambulatory Visit: Payer: BLUE CROSS/BLUE SHIELD | Attending: Physical Medicine and Rehabilitation | Admitting: Physical Therapy

## 2023-08-20 DIAGNOSIS — M545 Low back pain, unspecified: Secondary | ICD-10-CM | POA: Insufficient documentation

## 2023-08-20 DIAGNOSIS — G8929 Other chronic pain: Secondary | ICD-10-CM | POA: Insufficient documentation

## 2023-08-20 NOTE — Progress Notes (Signed)
[facility] SPINE THERAPY  LUMBAR SPINE EVALUATION    08/20/2023  Diagnosis:   1. Low back pain          Onset date: ONSET DATE (fast track?): Chronic    LBP that has been getting worse for the past few years. Pain is aching and compressing and happens when she stands and performs activities like pushing, pulling, lifting. Pain improves with movement and when she sits She starts on her side and ends up on her back during the night with a pillow under her legs. Has not been to PT before this date. She has a PMH of lumbar stenosis of L4-L5. Denies BL LE numbness and tingling.     SUBJECTIVE    Theresa Bullock is a 42 y.o. female who is present today for lumbar care.  Mechanism of injury/history of symptoms: No specific cause    Nature of Symptoms  Relevant symptoms: Aching, sore     Symptom frequency: Intermittent  Symptom intensity (0 - 10 scale): Now 0 Best 0 Worst 10  Symptom location: Posterior  central  low back  Symptoms worsen with: Lifting, Carrying, Standing   Symptoms improve with: Bending, stretching    Prior history of low back pain.  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        OBJECTIVE     Observation:  Cervcal lordosis: normal  Thoracic  kyphosis:normal  Lumbar lordosis: increased    Lateral Deviation: None    Palpation   NA    Segmental Mobility Assessment  Hypomobile    LUMBAR AROM / Movement pattern  Flexion: Reach to to floor, without pain with normal movement  Extension:  no loss in motion with normal movement  Left Sidebend: Reach to distal knee with normal movement  Right Sidebend: Reach to distal knee with normal movement  Left Rotation:  no loss in motion with normal movement  Right Rotation:  no loss in motion with normal movement    Directional Preference: NA  NA      Hamstring: 3+/5  Glute max: 3+/5 on L, 3- on R  All other MMTs 5/5  Neuromuscular Assessment  NA      Special Tests  Lumbar:  Elys,  Right LE  positive, Left LE   positive  Sacroilliac Joint:  NA  Hip: NA        Flexibility:   Hamstrings: WNL    Neuromuscular Control  Double leg lower for core strength: 3/5           ASSESSMENT  Findings consistent with 42 y.o. female with chronic LBP  with pain, ROM limitations,  strength limitations, functional limitations. She demonstrated reduced hip extension with excessive lumbar lordosis. She has poor neuromuscular control of the TA and pelvic girdle as well as strength deficits of the posterior chain. For these reasons, her lumbar spine is placed under excessive loading during ADLs. We will start with symptom modification activity including a flexion biased POC while improving neuromuscular control of abdominal stabilizers and eventually work into symptoms provoking movements (extension). Therapy is required to improve impairments as listed above in order to return to PLOF.   Significant presentation and prognostic factors include moderate to minimal symptom rating, medium disability rating, and stable clinical status.   Based on clinical evaluation and assessment, the primary rehabilitation approach will include symptom modulation, movement control, functional optimization.    Personal factors affecting treatment/recovery:  none  identified  Comorbidities affecting treatment/recovery:  Stenosis  Clinical presentation:  stable  Patient complexity:    low level as indicated by above stability of condition, personal factors, environmental factors and comorbidities in addition to patient symptom presentation and impairments found on physical exam.    Prognosis:  Good    Contraindications/Precautions/Limitation:  Per diagnosis    Short Term Goals:6week(s) Decrease c/o max pain to < 4/10, Increase strength to at least a 4/5, Minimal assistance with HEP/ education concepts, and improve DL lower test to at least a 4/5, perform at least 10 full PPTs with spine at neutral    Long Term Goals:16month(s) Walk community distances without increased symptoms, Stand as long as needed when completing ADL's without increased symptoms, Patient demonstrates improved functional core strength, Patient will return to full prior level of function  , Independent with symptom management    TREATMENT PLAN  Patient/family involved in developing goals and treatment plan: Yes    Recommended Treatment Freq 1 times per week(s) for 3 month(s)    Treatment plan inclusive of:  Exercise:AROM, AAROM, PROM, Stretching, Strengthening, Progressive Resistive, Coordination, Aerobic exercise  Manual Techniques:Myofascial Release, Joint mobilization, Soft tissue release/massage   Modalities:  Desensitization, Joint Mobilizations, Manual therapy,  Joint Mobilization, Soft tissue Mobilization / Massage, Massage  Functional: Plyometrics, Proprioception/Dynamic stability, Sports specific, Functional rehab, Agilities, Work Orthoptist, Economist, Investment banker, operational, Balance , Endurance training, Self-care, Home management      Thank you for the referral of this patient to [facility].    [place] Curt Oatis, PT     Minutes   Time Based CPT  Physical Performance test, Therapeutic Exercise, Therapeutic Activities, NM Re-Education, Manual Therapy, Gait Training, Massage, Aquatic  Therapy, Canalith Repositioning, Iontophoresis, Ultrasound, Orthotic fitting/training, Prosthetic fitting 15   Service Based (untimed) CPT  PT/OT evaluation, PT/OT re-eval, E-stim-unattended, Mechanical traction, Vasopneumatic device 30   Unbilled time (rest, etc)        Total Treatment Time 45       Posterior pelvic tilt 3x10   Ball rollouts x30E   Long axis hip ABD 3x10   Glute bridge 3x10 (to neutral spine, no HE)

## 2023-08-29 ENCOUNTER — Ambulatory Visit: Payer: BLUE CROSS/BLUE SHIELD | Admitting: Physical Therapy

## 2023-09-06 NOTE — Telephone Encounter (Unsigned)
Copied from CRM [id number]. Topic: Access to Care - Speak to Provider/Office Staff  >> Sep 06, 2023  9:37 AM Bronson Curb wrote:  Theresa Bullock, patient, is calling to speak with provider, this call is to request an update on Allergy Injections.     Please call the patient back at [phone] to discuss.

## 2023-09-06 NOTE — Telephone Encounter (Signed)
VM and MC sent letting Pt know that serum should be done soon and we will contact her to make an appt when it is done.

## 2023-09-14 ENCOUNTER — Ambulatory Visit: Payer: BLUE CROSS/BLUE SHIELD

## 2023-09-14 DIAGNOSIS — J309 Allergic rhinitis, unspecified: Secondary | ICD-10-CM

## 2023-09-14 DIAGNOSIS — J301 Allergic rhinitis due to pollen: Secondary | ICD-10-CM

## 2023-09-14 NOTE — Progress Notes (Signed)
Extracts made today from provider orders dated 08/10/2023.  Extracts contain seasonal (trees, grass and weed) and perennial (dander and dust mites) allergen components.      # of extracts ordered (Vial set #)  2      Build up order:  Conventional     Red doses:  20  Yellow doses:  12  Blue doses:  12  Green doses:  12    Total doses:  56    Coral Soler, RN          1:100 (Blue)  Low Risk                       1:1000  Conventional & High Risk  # Vial           Red   Yellow   Blue                    # Vial            Red   Yellow   Blue   Green  1 =  17 doses:   10 +  4  +  3                           1 =  28 doses:      10 +  6  +  6   +  6  2 =  34 doses:   20 +  8  +  6                           2 =  56 doses:      20 +  12 +  12 + 12  3 =  51 doses:   30 + 12 +  9                           3 =  84 doses:      30 +  18 +  18 + 18                                                                                                                                                                             4  =  112 doses:   40  +  24 +  24 + 24

## 2023-09-18 NOTE — Progress Notes (Signed)
Pt will need 1st allergy injection appointment at [place].  Will forward to scheduler to call pt and set up.     Please advise the following:  1)  take an antihistamine (Allegra/Fexofenadine 180 mg) at least 2 hours before the injection.    2) please avoid strenuous exercise for 1 hour before and 2 hours after the injection.  3)  pt will need to wait for 30 minutes after the injection for observation.  4) please avoid wearing cologne or perfume as our office is fragrance free (Thanks!)

## 2023-09-21 ENCOUNTER — Telehealth: Payer: Self-pay | Admitting: Allergy/Immunology/Rheumatology

## 2023-09-21 NOTE — Telephone Encounter (Signed)
Copied from CRM [id number]. Topic: Appointments - Schedule Appointment  >> Sep 21, 2023 11:53 AM Kirtland Bouchard V wrote:  Patient is requesting to schedule her first allergy injection.     Patient can be reached at [phone], to schedule.

## 2023-09-24 NOTE — Progress Notes (Signed)
10/05/23 IT sched in [place]

## 2023-10-01 ENCOUNTER — Other Ambulatory Visit: Payer: Self-pay

## 2023-10-02 ENCOUNTER — Ambulatory Visit: Payer: BLUE CROSS/BLUE SHIELD | Attending: Physical Medicine and Rehabilitation | Admitting: Physical Therapy

## 2023-10-02 DIAGNOSIS — G8929 Other chronic pain: Secondary | ICD-10-CM | POA: Insufficient documentation

## 2023-10-02 DIAGNOSIS — M545 Low back pain, unspecified: Secondary | ICD-10-CM | POA: Insufficient documentation

## 2023-10-02 NOTE — Progress Notes (Signed)
[facility] Orthopedic [facility]/Spine Rehabilitation  PT Treatment Note      Name: Theresa Bullock  DOB: Jan 07, 1981  Referring Physician: Alesia Banda, MD  Diagnosis:   1. Chronic bilateral low back pain without sciatica              Subjective:  Pain Assessment: 2  Pt reports an improvement in pain intensity and pain frequency since previous treatment session. Her exercises have helped her when she has been in pain. She recently underwent a gastric surgery at [facility]. The surgeon had told her to hold off on abdominal strengthening for 6 weeks from the surgery.        Objective:  ROM -    Strength - Therapeutic Exercises per flowsheet, Functional activities as charted, Strengthening to be initiated at/on per protocol/physician order., Home program   Function: - Improved  Education:  Verbal cues for ther ex, Manual cues for ther ex, Joint ed concepts, Spine ed concepts    Objective        Treatment:    Posterior pelvic tilt 3x10   Ball rollouts x30E   Long axis hip ABD 3x10   Glute bridge 3x10 (to neutral spine, no HE)       Leg press 3x12   HS curl 3x12           Assessment:   Patient tolerated all exercises well today however, during the leg press exercise she had felt extremely nauseous so we had to end the session early. We have decided to hold off on the therapy and for her to utilize her HEP program until her restrictions are lifted, especially since she reports the stretching has helped.        Plan of Care:  Continue per Plan of care -  As written; Patient would benefit from skilled rehabilitation services to address the above impairments to restore functional capacity.    Thank you for referring this patient to [facility] - [facility]    [place] Dylan Ruotolo, PT       Minutes   Time Based CPT  Physical Performance test, Therapeutic Exercise, Therapeutic Activities, NM Re-Education, Manual Therapy, Gait Training, Massage, Aquatic Therapy, Canalith Repositioning,  Iontophoresis, Ultrasound, Orthotic fitting/training, Prosthetic fitting 30   Service Based (untimed) CPT  PT/OT evaluation, PT/OT re-eval, E-stim-unattended, Mechanical traction, Vasopneumatic device    Unbilled time (rest, etc)        Total Treatment Time 30

## 2023-10-04 ENCOUNTER — Other Ambulatory Visit: Payer: Self-pay

## 2023-10-05 ENCOUNTER — Ambulatory Visit: Payer: BLUE CROSS/BLUE SHIELD

## 2023-10-05 ENCOUNTER — Telehealth: Payer: Self-pay

## 2023-10-05 NOTE — Telephone Encounter (Signed)
Copied from CRM [id number]. Topic: Appointments - Reschedule Appointment  >> Oct 05, 2023  1:02 PM Brunilda Payor wrote:  Theresa Bullock is calling to reschedule her allergy injection that was scheduled for today at 1:00 pm that she missed.    She can be reached at [phone].

## 2023-10-05 NOTE — Telephone Encounter (Addendum)
IT resched for 1/21

## 2023-10-16 ENCOUNTER — Encounter: Payer: Self-pay | Admitting: Gastroenterology

## 2023-10-16 ENCOUNTER — Ambulatory Visit: Payer: BLUE CROSS/BLUE SHIELD

## 2023-10-16 ENCOUNTER — Telehealth: Payer: Self-pay

## 2023-10-16 ENCOUNTER — Other Ambulatory Visit: Payer: Self-pay

## 2023-10-16 DIAGNOSIS — J301 Allergic rhinitis due to pollen: Secondary | ICD-10-CM

## 2023-10-16 NOTE — Telephone Encounter (Unsigned)
 Copied from CRM [id number]. Topic: Appointments - Reschedule Appointment  >> Oct 16, 2023  9:15 AM Brunilda Payor wrote:  Sherelle is calling to reschedule her allergy injection appointments. She will need all 3 rescheduled.    Please return their call at [phone].

## 2023-10-16 NOTE — Progress Notes (Signed)
 Pt feels well today.  Taking antihistamine daily.  Allergy injections given at time of appointment.  Pt observed for 30 minutes after injections.   Hydrocortisone cream applied as needed for itching.

## 2023-10-23 ENCOUNTER — Other Ambulatory Visit: Payer: Self-pay

## 2023-10-23 ENCOUNTER — Ambulatory Visit: Payer: BLUE CROSS/BLUE SHIELD

## 2023-10-23 DIAGNOSIS — J301 Allergic rhinitis due to pollen: Secondary | ICD-10-CM

## 2023-10-23 NOTE — Progress Notes (Signed)
 Pt feels well today.  Taking antihistamine daily.  Allergy injections given at time of appointment.  Pt observed for 30 minutes after injections.   Hydrocortisone cream applied as needed for itching.

## 2023-10-30 ENCOUNTER — Ambulatory Visit: Payer: BLUE CROSS/BLUE SHIELD | Admitting: Physical Therapy

## 2023-10-31 ENCOUNTER — Ambulatory Visit: Payer: BLUE CROSS/BLUE SHIELD

## 2023-11-06 ENCOUNTER — Ambulatory Visit: Payer: BLUE CROSS/BLUE SHIELD

## 2023-11-06 ENCOUNTER — Other Ambulatory Visit: Payer: Self-pay

## 2023-11-06 DIAGNOSIS — J301 Allergic rhinitis due to pollen: Secondary | ICD-10-CM

## 2023-11-06 NOTE — Progress Notes (Signed)
Pt feels well today.  Taking antihistamine daily.  Allergy injections given at time of appointment.  Pt observed for 30 minutes after injections.   Hydrocortisone cream applied as needed for itching.

## 2023-11-07 ENCOUNTER — Ambulatory Visit: Payer: BLUE CROSS/BLUE SHIELD

## 2023-11-13 ENCOUNTER — Ambulatory Visit: Payer: BLUE CROSS/BLUE SHIELD

## 2023-11-13 ENCOUNTER — Other Ambulatory Visit: Payer: Self-pay

## 2023-11-13 DIAGNOSIS — J301 Allergic rhinitis due to pollen: Secondary | ICD-10-CM

## 2023-11-13 NOTE — Progress Notes (Signed)
 Pt feels well today.  Taking antihistamine daily.  Allergy injections given at time of appointment.  Pt observed for 30 minutes after injections.   Hydrocortisone cream applied as needed for itching.

## 2023-11-21 ENCOUNTER — Ambulatory Visit: Payer: BLUE CROSS/BLUE SHIELD

## 2023-11-21 ENCOUNTER — Other Ambulatory Visit: Payer: Self-pay

## 2023-11-21 DIAGNOSIS — J301 Allergic rhinitis due to pollen: Secondary | ICD-10-CM

## 2023-11-21 NOTE — Progress Notes (Signed)
 Pt feels well today.  Taking antihistamine daily.  Allergy injections given at time of appointment.  Pt observed for 30 minutes after injections.   Hydrocortisone cream applied as needed for itching.

## 2023-11-28 ENCOUNTER — Other Ambulatory Visit: Payer: Self-pay

## 2023-11-28 ENCOUNTER — Ambulatory Visit: Payer: BLUE CROSS/BLUE SHIELD

## 2023-11-28 DIAGNOSIS — J301 Allergic rhinitis due to pollen: Secondary | ICD-10-CM

## 2023-11-28 NOTE — Progress Notes (Signed)
 Pt feels well today.  Taking antihistamine daily.  Allergy injections given at time of appointment.  Pt observed for 30 minutes after injections.   Hydrocortisone cream applied as needed for itching.

## 2023-11-29 ENCOUNTER — Telehealth: Payer: Self-pay | Admitting: Gastroenterology

## 2023-11-29 NOTE — Telephone Encounter (Signed)
 Copied from CRM #[id number]. Topic: Appointments - Schedule Appointment  >> Nov 29, 2023  1:03 PM [name] wrote:  Theresa Bullock, Patient, is calling to schedule an Injection appointment.  She said Wednesday's are usually best for her to schedule if there is any availability     Please return their call at [phone]

## 2023-11-29 NOTE — Telephone Encounter (Signed)
 Left a detailed message to inform that Patient is now scheduled at the [place] location for her weekly IT on Wed. 12/05/23 at 9:30am.

## 2023-12-05 ENCOUNTER — Other Ambulatory Visit: Payer: Self-pay

## 2023-12-05 ENCOUNTER — Ambulatory Visit

## 2023-12-05 DIAGNOSIS — J301 Allergic rhinitis due to pollen: Secondary | ICD-10-CM

## 2023-12-05 NOTE — Progress Notes (Signed)
 Pt feels well today.  Taking antihistamine daily.  Allergy injections given at time of appointment.  Pt observed for 30 minutes after injections.   Hydrocortisone cream applied as needed for itching.

## 2023-12-06 NOTE — Progress Notes (Signed)
 Theresa Bullock      Theresa Bullock was seen in follow-up today at the [facility] for asthma.  She was last seen here on June 19, 2023.  Patient of Dr. Vella Redhead.      At the last visit she was doing well and was scheduled to have bariatric surgery.  She was instructed to start taking the Symbicort 160/4.5 mcg inhaler 1 puff once a day for couple weeks and if she was not having any side effects to increase it to 2 puffs once a day and stay on this dose until her procedure.  She was also instructed she can take an extra 1 to 2 puffs if needed in place of her rescue inhaler.    Interm History     History of Present Illness    She underwent a successful bariatric surgery in 08/2023 at [facility]. Prior to the surgery, she was started on Symbicort 1 puff once a day for a couple of weeks, with the dosage increased to 2 puffs daily if no side effects were observed. She was also advised to take an extra 1 to 2 puffs as needed in place of her rescue inhaler. Post-surgery, she has not required the use of her rescue inhaler or nebulizer. She has discontinued the use of montelukast since 02/2023.     She reports no wheezing or shortness of breath but experiences mucus buildup in her throat. She occasionally experiences a dry, tickle cough but does not produce any chest mucus. She is able to perform her normal activities without feeling winded. She reports very mild wheezing, which she attributes to a recent cold or allergies. She reports no chest tightness, pain, or waking up in the middle of the night due to breathing issues. She reports no fevers unless she is ill.     She has initiated weekly allergy injections and has an upcoming appointment tomorrow. She reports runny eyes but no sneezing or itching. She reports no nasal congestion. She is not currently using any nasal sprays. She takes fexofenadine as needed, typically the day before her allergy injections. She also uses Pataday eyedrops as  needed.    She reports a sensation of movement and pain when lying down in her throat area, which she attributes to intubation during her surgery. She reports no painful swallowing. She reports no heartburn symptoms. She had a hernia, which was corrected during her surgery. She has a lap band in her stomach. She reports no acid reflux. She has discontinued omeprazole and reports no sour taste in her mouth. She reports regurgitation if she eats too quickly. She reports a sensation of a ball in her throat and choking. She maintains a good appetite and adheres to her diet. She has lost weight and engages in walking for 30 minutes 5 days a week.    She has been taking minoxidil for hair loss and has noticed some swelling in her ankles, which may or may not be related.    She had a home sleep study done November 08, 2022 which showed mild obstructive sleep apnea.  She was started on CPAP that she was wearing on a nightly basis but stopped after the surgery.  She is currently wearing it about once a month. 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      She is followed by allergy and is on allergy shots and is going  weekly in the build up phase.     COVID vaccination:  Received and is UTD  Flu vaccination: Received and is UTD    Pneumonia vaccination:  Received     ROS:    As documented above    Asthma Control Test:   In the past 4 weeks, how much of the time did your asthma keep you from getting as much done at work, school, or home?: 5-None of the time  During the past 4 weeks, how often have you had shortness of breath?: 4-Once or twice a week  During the past 4 weeks, how often did your asthma symptoms wake you up at night or earlier than usual in the morning?: 4-once or twice  During the past 4 weeks, how often have you used your rescue inhaler or nebulizer medication(such as albuterol)?: 5-not at all  How would you rate your asthma control during the past 4  weeks?: 5-Completely controlled  ACT SCORE 12 and OLDER: 23 which represents good control.  (Score of 20 or higher represents good control.)     Urgent Care Visits: Denies r/t breathing issues since the last visit.   ED Visits / Hospitalizations: Denies r/t breathing issues since the last visit.     A complete medication list was reviewed and updated with the patient.    Medications     Current Outpatient Medications   Medication    omeprazole (PRILOSEC) 40 mg capsule    fexofenadine (ALLEGRA) 180 mg tablet    EPINEPHrine (EPIPEN) 0.3 mg/0.3 mL auto-injector    budesonide-formoterol (SYMBICORT) 160-4.5 MCG/ACT inhaler    montelukast (SINGULAIR) 10 mg tablet    albuterol HFA (PROVENTIL, VENTOLIN, PROAIR HFA) 108 (90 Base) MCG/ACT inhaler    fluocinolone 0.01% scalp (DERMA-SMOOTHE/FS) 0.01 % OIL external oil    ketoconazole (NIZORAL) 2 % shampoo    olopatadine (PATADAY) 0.2 % ophthalmic solution    meloxicam (MOBIC) 7.5 mg tablet     No current facility-administered medications for this visit.     Pertinent pulmonary medications include:     Albuterol inhaler as a rescue that she has not needed to use in months.      Levalbuterol via nebulizer as needed that she has not used in months.    Symbicort 160/4.5 mcg inhaler 2 puffs twice a day as needed  Montelukast 10 mg at bedtime which she stopped and has not taken since June 2024.       Allergic reactions were reviewed and updated.    A ROS was performed and pertinent findings include those mentioned above.     Family History     Family History   Problem Relation Age of Onset    Allergic rhinitis Mother     Diabetes Mother     Breast cancer Mother     Stroke Mother     Heart failure Mother     Diabetes Father     High Blood Pressure Father     Cataracts Father     Sickle cell trait Father     Anemia Father     Breast cancer Maternal Aunt     Alzheimer's disease Maternal Aunt     Diabetes Maternal Uncle     Stroke Maternal Uncle     High Blood Pressure Maternal  Grandmother     Pancreatic Cancer Maternal Grandfather     Diabetes Paternal Grandmother     Autism Son     Colon cancer Neg Hx  Thyroid cancer Neg Hx      Social History     Works: Charity fundraiser - travel Engineer, 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 Ra    Physical Exam  Pleasant 43 y.o. female in NAD  No sinus tenderness to palpation. Mucous membranes are pink and moist. No evidence of oral candidiasis.  Lungs are clear to auscultation bilaterally without any wheeze, rhonchi or crackles on forced exhalation.  Heart has a regular rate and rhythm.  No lower leg edema noted.     Results     The following studies were personally reviewed and discussed with patient.     PFT's  Date FVC (%)  FEV1 (%)  FEV1/FVC (%)   Diffusion (%)   12/11/2023 3.23 L (96%) 2.58 L (93%) 80 (98%)    12/13/2022 3.04 L (98%) 2.50 L (98%) 82 (100%)    10/10/2022 3.27 L (105%) 2.74 L (107%) 84 (102%) 17.20 (81%)     Spirometry is normal.  There was a 6% increase in her FVC and a 3% increase in her FEV1 when compared to March 2024.    Exhaled Nitric Oxide:    Date  Result (ppb) Interpretation (Inflammation)   12/11/2023 36 Intermediate   12/13/2022 25 Intermediate   10/10/2022 47   intermediate     Imaging     The following studies were personally reviewed.      No recent chest imaging.    Labs         Lab results: 02/21/23  1554   WBC 8.0   Hemoglobin 14.8   Hematocrit 45   RBC 5.4   Platelets 355         Lab results: 02/21/23  1554   Sodium 140   Potassium 4.3   Chloride 104   CO2 22   UN 13   Creatinine 0.79   Glucose 83   Calcium 9.3   Total Protein 7.2   Albumin 4.5   ALT 17   AST 19   Alk Phos 57   Bilirubin,Total 0.3         Latest Ref Rng & Units 02/21/2023     3:54 PM  10/10/2022    12:29 PM   AMB ALLERGY RAST REVIEW   Alternaria Tenuis IgE kU/L  <0.10    A.fumigatus IgE kU/L  <0.10    Cat Epi/Dander IgE kU/L  0.19    Cladosporium IgE kU/L  <0.10    Clam IgE kU/L <0.10     Cockroach,German IgE kU/L  <0.10    Crab IgE kU/L <0.10     DF Mites IgE kU/L  0.13    DP Mites IgE kU/L  0.12    Grass,[place] Blue IgE kU/L  2.54    Grass,Orchard IgE kU/L  1.92    IgE 0 - 158 kU/L 110  94    Lamb's Quarters IgE kU/L  <0.10    Lobster IgE kU/L <0.10     Oyster IgE kU/L <0.10     Ragweed,Short IgE kU/L  3.21    Scallop IgE kU/L <0.10     Shrimp IgE kU/L 0.10     Tree,White  Ash IgE kU/L  0.37    Tree,Birch IgE kU/L  0.23    Tree,Box Elder/Maple IgE kU/L  <0.10    Tree,Elm IgE kU/L  0.32    Tree, Oak IgE kU/L  3.43      Percutaneous testing (01/04/2023) to a panel of common aeroallergens was positive to dog, dust mite, trees, grasses, and amaranths.      Date Eosinophil number   02/21/2023 600   10/24/2022  200   08/15/2021 600     Active Problem List     Patient Active Problem List   Diagnosis Code    Chronic midline low back pain without sciatica M54.50, G89.29    Class 1 obesity E66.811    Pre-diabetes R73.03    OSA (obstructive sleep apnea) G47.33    Gastroesophageal reflux disease without esophagitis K21.9    Moderate persistent asthma without complication J45.40     Impression / Plan     Montia Haslip is a 43 y.o. female never smoker with a history of asthma and allergies who is presenting today for follow-up.  Assessment & Plan  1. Asthma.  Her asthma is currently well-managed, as evidenced by satisfactory spirometry results. She has not required the use of her rescue inhaler or nebulizer since her surgery. Her asthma control score is commendable at 23. She is advised to utilize Symbicort as needed for symptom control. The standard dosage is 2 puffs twice daily, but she may adjust this to 1 puff twice daily if she feels a cold coming on, with the option to add an extra puff in between.  Montelukast will be temporarily discontinued and her condition will be closely monitored. If there is an increase in allergy and asthma symptoms, montelukast may be reintroduced.    2. Allergic rhinitis.  She is advised to continue with her allergy injections. Fexofenadine can be added to her regimen if she experiences an increase in allergy symptoms. In the event of significant postnasal drip symptoms, Flonase 1 to 2 squirts in each nostril may be considered. She is encouraged to maintain her follow-up appointments with her allergist.    3. Gastroesophageal reflux disease.  She is not currently experiencing GERD symptoms, but it is important to remain vigilant for potential silent reflux. She reports no heartburn or acid reflux symptoms and has stopped taking omeprazole. She does complain of mucus in her throat which could be an indication for reflux.  She is advised to monitor for any signs of GERD and to consult with her bariatric specialist for further recommendations.    Follow-up  The patient is scheduled for a follow-up visit in October 2025 with Dr. Vella Redhead.    She is aware to contact the office if there are any questions or concerns.    I personally spent 40 minutes on the calendar day of the encounter, including pre and post visit work.  Patient chart and history and pulmonary function tests were personally reviewed and discussed with the patient. Permission was obtain to use DAX for documentation purposes during or visit.     Thank you for allowing [place] to remain involved in the care of your patient. Please don't hesitate to contact us with any questions or concerns.     Sincerely,   Drenda Freeze, FNP-C    Electronically signed by Durward Fortes, NP 12/11/2023 11:13 AM    Dictated using Dragon voice recognition software.

## 2023-12-11 ENCOUNTER — Other Ambulatory Visit: Payer: Self-pay

## 2023-12-11 ENCOUNTER — Ambulatory Visit: Payer: Medicaid Other | Attending: Pulmonary Disease | Admitting: Pulmonary Disease

## 2023-12-11 ENCOUNTER — Ambulatory Visit: Payer: Medicaid Other

## 2023-12-11 ENCOUNTER — Encounter: Payer: Self-pay | Admitting: Pulmonary Disease

## 2023-12-11 ENCOUNTER — Ambulatory Visit: Payer: BLUE CROSS/BLUE SHIELD | Admitting: Physical Medicine and Rehabilitation

## 2023-12-11 VITALS — BP 118/81 | HR 98 | Ht 65.25 in | Wt 215.0 lb

## 2023-12-11 DIAGNOSIS — J4540 Moderate persistent asthma, uncomplicated: Secondary | ICD-10-CM

## 2023-12-11 DIAGNOSIS — J3089 Other allergic rhinitis: Secondary | ICD-10-CM | POA: Insufficient documentation

## 2023-12-11 DIAGNOSIS — J4520 Mild intermittent asthma, uncomplicated: Secondary | ICD-10-CM | POA: Insufficient documentation

## 2023-12-11 LAB — PULMONARY FUNCTION TEST
FEV1 (L): 2.58 L
FEV1 Z-score: -0.48
FEV1/FVC Z-score: -0.33
FEV1/FVC: 80 %
FVC (L): 3.23 L
FVC Z-score: -0.28

## 2023-12-12 ENCOUNTER — Ambulatory Visit

## 2023-12-18 ENCOUNTER — Encounter: Payer: Self-pay | Admitting: Pulmonary Disease

## 2023-12-18 ENCOUNTER — Telehealth: Payer: Self-pay | Admitting: Pulmonary Disease

## 2023-12-18 MED ORDER — PREDNISONE 20 MG PO TABS *I*
ORAL_TABLET | ORAL | 0 refills | Status: DC
Start: 2023-12-18 — End: 2024-05-07

## 2023-12-18 NOTE — Telephone Encounter (Signed)
 Writer spoke to patient who advises that she is wheezing and is looking for prednisone.  She has URI for 5 days. Was seen at [facility] Friday and diagnosed with pneumonia on X-ray. Neg COVID/Flu/RSV. Started Augmenting and Doxy. She is also using mucinex.  She does not have a fever anymore but still has productive cough with clear sputum. She feels fatigued and has lost her appetite and feels nauseas. She tried leftover anti-nausea medication today to see if it helps.  She is reluctant to use her albuterol inhaler for the wheezing d/t side effects(jittery). She does have a nebulizer and levalbuterol which Clinical research associate encourage her to try.  Writer advised that the nausea/lack of appetite may be caused by the doxy and recommended taking it with food.   Writer advised that provider would be notified and will advise medication  Pharmacy updated in chart  Pt verbalized understanding of all conversation and agrees with plan.

## 2023-12-18 NOTE — Telephone Encounter (Signed)
 Copied from CRM #[id number]. Topic: Access to Care - Speak to Provider/Office Staff  >> Dec 18, 2023 10:41 AM Theresa Bullock wrote:  Patient, calling to report that the patient is experiencing wheezing. These symptoms have been going on for five  day(s)    Patient requesting same day appointment? No but she would like an appointment soon.  Patient requesting call back? yes    Phone number confirmed at [phone]

## 2023-12-18 NOTE — Telephone Encounter (Signed)
 Now she is starting to get SOB, wheezing and cough and is waking her up at night. She started to take the levalbuterol in the nebulizer.  Used it once so far.  Instructed to take another 2 times before bed.  Prednisone 20 mg tablets with the instructions to take 2 tablets once a day for 5 days then stop was sent to the pharmacy that she will start tomorrow if she is no better with using the nebulizer.Tries to avoid prednisone if she can.

## 2023-12-19 ENCOUNTER — Ambulatory Visit

## 2023-12-20 NOTE — Telephone Encounter (Signed)
 Confirmed with patient appt w MM on 3.28.25

## 2023-12-20 NOTE — Progress Notes (Signed)
 Theresa Bullock Follow-Up Note      Theresa Bullock was seen in follow-up today at the [facility] for an acute visit for her asthma.  She was last seen here on December 11, 2023.  Patient of Dr. Vella Redhead.      At the last visit she was doing well with regards to her asthma.  She was using the Symbicort on an as-needed basis and was instructed to continue this with the understanding if she develop any kind of upper respiratory infection that she would need to take it more regularly.  She was instructed to use this in place of her rescue inhaler.  She was instructed to continue with her allergy injections and that if she started having an increase in her postnasal drip symptoms she can add fluticasone nasal spray 1-2 squirts each nostril daily.    Interm History     She contacted the office March 25 stating that she was noticing an increase in the wheezing.  She had an upper respiratory infection for 5 days and then was seen in urgent care and was diagnosed with pneumonia on x-ray.  She was encouraged to use the levalbuterol via nebulization.  She was given prednisone 40 mg a day for 5 days and was going to start it the next day if her symptoms were no better with using the nebulizer.    Overall she reports she is 80% improved since starting the prednisone.  She said she is not using the Symbicort currently but thinks she overused it when she started having increased symptoms since she was using it 3-4 times a day and started to have increased jitteriness with it.  She did start the budesonide via nebulization twice a day.  She is using the levalbuterol via nebulization twice a day now where previously she was taking it 3 times a day.  She is not using the rescue inhaler since she is using the nebulizer.    She does have a congested cough and is short of breath with exertion but this is improving.  She has had wheezing and chest tightness and is waking up at night with the cough.  She is not noticing the  sinus congestion or postnasal drip symptoms.    She had a home sleep study done November 08, 2022 which showed mild obstructive sleep apnea.  She was started on CPAP that she was wearing on a nightly basis but stopped after the surgery.  She is currently wearing it about once a month. 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      She is followed by allergy and is on allergy shots and is going weekly in the build up phase.     COVID vaccination:  Received and is UTD  Flu vaccination: Received and is UTD    Pneumonia vaccination:  Received     ROS:    As documented above    Asthma Control Test:   In the past 4 weeks, how much of the time did your asthma keep you from getting as much done at work, school, or home?: 3-Some of the time  During the past 4 weeks, how often have you had shortness of breath?: 3-3 to 6 times a week  During the past 4 weeks, how often did your asthma symptoms wake you up at night or earlier than usual in the morning?: 2-2 or 3 nights a  week  During the past 4 weeks, how often have you used your rescue inhaler or nebulizer medication(such as albuterol)?: 2-1 or 2 times per day  How would you rate your asthma control during the past 4 weeks?: 3-Somewhat controlled  ACT SCORE 12 and OLDER: 13 which represents poor control and is indicative of her current exacerbation.  (Score of 20 or higher represents good control.)     Urgent Care Visits: Denies r/t breathing issues since the last visit.   ED Visits / Hospitalizations: Denies r/t breathing issues since the last visit.     A complete medication list was reviewed and updated with the patient.    Medications     Current Outpatient Medications   Medication    predniSONE (DELTASONE) 20 mg tablet    fexofenadine (ALLEGRA) 180 mg tablet    EPINEPHrine (EPIPEN) 0.3 mg/0.3 mL auto-injector    budesonide-formoterol (SYMBICORT) 160-4.5 MCG/ACT inhaler    montelukast (SINGULAIR) 10 mg tablet     albuterol HFA (PROVENTIL, VENTOLIN, PROAIR HFA) 108 (90 Base) MCG/ACT inhaler    fluocinolone 0.01% scalp (DERMA-SMOOTHE/FS) 0.01 % OIL external oil    ketoconazole (NIZORAL) 2 % shampoo    olopatadine (PATADAY) 0.2 % ophthalmic solution    meloxicam (MOBIC) 7.5 mg tablet     No current facility-administered medications for this visit.     Pertinent pulmonary medications include:     Albuterol inhaler as a rescue that she has not taking since she is using the nebulizer.    Levalbuterol via nebulizer as needed that she was taking 3 times a day but is now down to twice a day.  Symbicort 160/4.5 mcg inhaler 2 puffs twice a day as needed  When she started it she was taking 3-4 times a day and then it made her too jittery so she stopped.    Budesonide nebulizer twice a day that she started   Prednisone 40 mg daily and is on her 3rd day.     Allergic reactions were reviewed and updated.    A ROS was performed and pertinent findings include those mentioned above.     Family History     Family History   Problem Relation Age of Onset    Allergic rhinitis Mother     Diabetes Mother     Breast cancer Mother     Stroke Mother     Heart failure Mother     Diabetes Father     High Blood Pressure Father     Cataracts Father     Sickle cell trait Father     Anemia Father     Breast cancer Maternal Aunt     Alzheimer's disease Maternal Aunt     Diabetes Maternal Uncle     Stroke Maternal Uncle     High Blood Pressure Maternal Grandmother     Pancreatic Cancer Maternal Grandfather     Diabetes Paternal Grandmother     Autism Son     Colon cancer Neg Hx     Thyroid cancer Neg Hx      Social History     Works: Charity fundraiser - travel Engineer, 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 rate 17, weight 93.9 kg (207 lb),  SpO2 97%.  Ra  Physical Exam  Patient is a 43 year old female in no apparent distress.  No sinus tenderness to palpation. Mucous membranes are pink and moist. No evidence of oral candidiasis.  Lungs have decreased breath sounds with scattered rhonchi and faint end expiratory wheezes noted bilaterally.  Heart has a regular rate and rhythm.  No lower leg edema noted.     Results     The following studies were personally reviewed.     PFT's  Date FVC (%)  FEV1 (%)  FEV1/FVC (%)   Diffusion (%)   12/11/2023 3.23 L (96%) 2.58 L (93%) 80 (98%)    12/13/2022 3.04 L (98%) 2.50 L (98%) 82 (100%)    10/10/2022 3.27 L (105%) 2.74 L (107%) 84 (102%) 17.20 (81%)     Spirometry not done at today's visit.    Exhaled Nitric Oxide:    Date  Result (ppb) Interpretation (Inflammation)   12/11/2023 36 Intermediate   12/13/2022 25 Intermediate   10/10/2022 47   intermediate     Imaging     The following studies were personally reviewed.      No recent chest imaging.    Labs         Lab results: 02/21/23  1554   WBC 8.0   Hemoglobin 14.8   Hematocrit 45   RBC 5.4   Platelets 355         Lab results: 02/21/23  1554   Sodium 140   Potassium 4.3   Chloride 104   CO2 22   UN 13   Creatinine 0.79   Glucose 83   Calcium 9.3   Total Protein 7.2   Albumin 4.5   ALT 17   AST 19   Alk Phos 57   Bilirubin,Total 0.3         Latest Ref Rng & Units 02/21/2023     3:54 PM 10/10/2022    12:29 PM   AMB ALLERGY RAST REVIEW   Alternaria Tenuis IgE kU/L  <0.10    A.fumigatus IgE kU/L  <0.10    Cat Epi/Dander IgE kU/L  0.19    Cladosporium IgE kU/L  <0.10    Clam IgE kU/L <0.10     Cockroach,German IgE kU/L  <0.10    Crab IgE kU/L <0.10     DF Mites IgE kU/L  0.13    DP Mites IgE kU/L  0.12    Grass,[place] Blue IgE kU/L  2.54    Grass,Orchard IgE kU/L  1.92    IgE 0 - 158 kU/L 110  94    Lamb's Quarters IgE kU/L  <0.10    Lobster IgE kU/L <0.10     Oyster IgE kU/L <0.10     Ragweed,Short IgE kU/L  3.21    Scallop IgE kU/L <0.10     Shrimp IgE kU/L 0.10     Tree,White Ash IgE  kU/L  0.37    Tree,Birch IgE kU/L  0.23    Tree,Box Elder/Maple IgE kU/L  <0.10    Tree,Elm IgE kU/L  0.32    Tree, Oak IgE kU/L  3.43      Percutaneous testing (01/04/2023) to a panel of common aeroallergens was positive to dog, dust mite, trees, grasses, and amaranths.      Date Eosinophil number   02/21/2023 600   10/24/2022  200   08/15/2021 600     Active Problem List     Patient Active Problem List   Diagnosis  Code    Chronic midline low back pain without sciatica M54.50, G89.29    Class 1 obesity E66.811    Pre-diabetes R73.03    OSA (obstructive sleep apnea) G47.33    Gastroesophageal reflux disease without esophagitis K21.9    Moderate persistent asthma without complication J45.40     Impression / Plan     Theresa Bullock is a 43 y.o. female never smoker with a history of asthma and allergies who is presenting today for follow-up.     Asthma exacerbation.  She was instructed to restart the Symbicort 160/4.5 mcg Hailer 1 puff twice a day since she was having a problem with the increased jitteriness.  If she is doing okay she was instructed to increase the Symbicort to 2 puffs twice a day.  She will continue to use the lev albuterol as needed.  She will continue with the budesonide via nebulization twice a day and as symptoms decrease she will then decrease to once a day and then stop.  She will finish off her prednisone.  She will send me a MyChart message in 6 to 8 weeks and we can order repeat chest x-ray to follow-up on her recent film.  She will follow-up as planned in 3 to 4 months.    She is aware to contact the office if there are any questions or concerns.    I personally spent 30 minutes on the calendar day of the encounter, including pre and post visit work.  Patient chart and history were personally reviewed and discussed with the patient. Permission was obtain to use DAX for documentation purposes during or visit.     Thank you for allowing [place] to remain involved in the care of your patient. Please  don't hesitate to contact us with any questions or concerns.     Sincerely,   Drenda Freeze, FNP-C    Electronically signed by Durward Fortes, NP 12/21/2023 11:07 AM    Dictated using Dragon voice recognition software.

## 2023-12-21 ENCOUNTER — Ambulatory Visit: Attending: Pulmonology | Admitting: Pulmonary Disease

## 2023-12-21 ENCOUNTER — Encounter: Payer: Self-pay | Admitting: Pulmonary Disease

## 2023-12-21 ENCOUNTER — Other Ambulatory Visit: Payer: Self-pay

## 2023-12-21 VITALS — BP 122/88 | HR 73 | Resp 17 | Wt 207.0 lb

## 2023-12-21 DIAGNOSIS — K219 Gastro-esophageal reflux disease without esophagitis: Secondary | ICD-10-CM

## 2023-12-21 DIAGNOSIS — J4540 Moderate persistent asthma, uncomplicated: Secondary | ICD-10-CM

## 2023-12-21 DIAGNOSIS — G4733 Obstructive sleep apnea (adult) (pediatric): Secondary | ICD-10-CM

## 2023-12-21 NOTE — Patient Instructions (Addendum)
 Restart Symbicort 160/4.5 mcg inhaler at 1 puff twice a day due to causing increased jitteriness.  If doing ok then increase to 2 puffs twice a day.   Levabuterol via nebulizer as needed.    Budesonide via nebulization twice a day and as symptoms decrease then decrease to once a day and then stop.  Finish off the prednisone.  In 6-8 weeks can send me a message to get a repeat chest x-ray.    Follow up in 3-4 months with [facility]

## 2023-12-25 ENCOUNTER — Ambulatory Visit

## 2023-12-25 ENCOUNTER — Other Ambulatory Visit: Payer: Self-pay

## 2023-12-25 DIAGNOSIS — J301 Allergic rhinitis due to pollen: Secondary | ICD-10-CM

## 2023-12-25 NOTE — Progress Notes (Signed)
 Pt feels well today.  Taking antihistamine daily.  Allergy injections given at time of appointment.  Pt observed for 30 minutes after injections.   Hydrocortisone cream applied as needed for itching.

## 2023-12-25 NOTE — Telephone Encounter (Signed)
 Patient came in on 3.28.25

## 2024-01-01 ENCOUNTER — Ambulatory Visit

## 2024-01-01 ENCOUNTER — Other Ambulatory Visit: Payer: Self-pay

## 2024-01-01 DIAGNOSIS — J301 Allergic rhinitis due to pollen: Secondary | ICD-10-CM

## 2024-01-01 NOTE — Progress Notes (Signed)
Pt feels well today.  Taking antihistamine daily.  Allergy injections given at time of appointment.  Pt observed for 30 minutes after injections.

## 2024-01-19 ENCOUNTER — Other Ambulatory Visit: Payer: Self-pay | Admitting: Family

## 2024-01-19 DIAGNOSIS — L408 Other psoriasis: Secondary | ICD-10-CM

## 2024-01-22 ENCOUNTER — Ambulatory Visit

## 2024-01-22 ENCOUNTER — Other Ambulatory Visit: Payer: Self-pay

## 2024-01-22 DIAGNOSIS — J301 Allergic rhinitis due to pollen: Secondary | ICD-10-CM

## 2024-01-22 NOTE — Progress Notes (Signed)
 Pt feels well today.  Taking antihistamine daily.  Allergy injections given at time of appointment.  Pt observed for 30 minutes after injections.   Hydrocortisone cream applied as needed for itching.

## 2024-01-29 ENCOUNTER — Ambulatory Visit

## 2024-01-29 ENCOUNTER — Other Ambulatory Visit: Payer: Self-pay

## 2024-01-29 DIAGNOSIS — J301 Allergic rhinitis due to pollen: Secondary | ICD-10-CM

## 2024-01-29 NOTE — Progress Notes (Signed)
Pt feels well today.  Taking antihistamine daily.  Allergy injections given at time of appointment.  Pt observed for 30 minutes after injections.   Hydrocortisone cream applied as needed for itching.  2 injections.

## 2024-02-05 ENCOUNTER — Other Ambulatory Visit: Payer: Self-pay | Admitting: Internal Medicine

## 2024-02-05 ENCOUNTER — Other Ambulatory Visit: Payer: Self-pay | Admitting: Acute Care

## 2024-02-05 ENCOUNTER — Other Ambulatory Visit: Payer: Self-pay

## 2024-02-05 ENCOUNTER — Ambulatory Visit

## 2024-02-05 DIAGNOSIS — J301 Allergic rhinitis due to pollen: Secondary | ICD-10-CM

## 2024-02-05 DIAGNOSIS — Z1231 Encounter for screening mammogram for malignant neoplasm of breast: Secondary | ICD-10-CM

## 2024-02-05 DIAGNOSIS — L408 Other psoriasis: Secondary | ICD-10-CM

## 2024-02-05 MED ORDER — KETOCONAZOLE 2 % EX SHAM *I*
MEDICATED_SHAMPOO | CUTANEOUS | 0 refills | Status: DC
Start: 2024-02-05 — End: 2024-07-21

## 2024-02-05 MED ORDER — KETOCONAZOLE 2 % EX SHAM *I*
MEDICATED_SHAMPOO | CUTANEOUS | 0 refills | Status: DC
Start: 2024-02-05 — End: 2024-05-07

## 2024-02-05 NOTE — Progress Notes (Signed)
 Pt has scheduled apt in July. 1 refill sent.

## 2024-02-05 NOTE — Progress Notes (Signed)
 Pt feels well today.  Taking antihistamine daily.  Allergy injections given at time of appointment.  Pt observed for 30 minutes after injections.   Hydrocortisone cream applied as needed for itching.

## 2024-02-08 ENCOUNTER — Encounter: Payer: Self-pay | Admitting: Pulmonary Disease

## 2024-02-08 ENCOUNTER — Other Ambulatory Visit: Payer: Self-pay

## 2024-02-08 ENCOUNTER — Ambulatory Visit: Payer: Self-pay | Admitting: Pulmonology

## 2024-02-08 ENCOUNTER — Ambulatory Visit
Admission: RE | Admit: 2024-02-08 | Discharge: 2024-02-08 | Disposition: A | Discharge status: Home / Self Care | Source: Ambulatory Visit

## 2024-02-08 ENCOUNTER — Ambulatory Visit
Admission: RE | Admit: 2024-02-08 | Discharge: 2024-02-08 | Disposition: A | Discharge status: Home / Self Care | Source: Ambulatory Visit | Attending: Internal Medicine | Admitting: Internal Medicine

## 2024-02-08 ENCOUNTER — Other Ambulatory Visit: Payer: Self-pay | Admitting: Internal Medicine

## 2024-02-08 DIAGNOSIS — J4520 Mild intermittent asthma, uncomplicated: Secondary | ICD-10-CM

## 2024-02-08 DIAGNOSIS — Z1231 Encounter for screening mammogram for malignant neoplasm of breast: Secondary | ICD-10-CM | POA: Insufficient documentation

## 2024-02-08 DIAGNOSIS — J189 Pneumonia, unspecified organism: Secondary | ICD-10-CM

## 2024-02-08 DIAGNOSIS — Z803 Family history of malignant neoplasm of breast: Secondary | ICD-10-CM

## 2024-02-12 ENCOUNTER — Ambulatory Visit

## 2024-02-12 ENCOUNTER — Other Ambulatory Visit: Payer: Self-pay

## 2024-02-12 DIAGNOSIS — J301 Allergic rhinitis due to pollen: Secondary | ICD-10-CM

## 2024-02-12 NOTE — Progress Notes (Signed)
 Pt feels well today.  Taking antihistamine daily.  Allergy injections given at time of appointment.  Pt observed for 30 minutes after injections.   Hydrocortisone cream applied as needed for itching.

## 2024-02-13 ENCOUNTER — Other Ambulatory Visit: Payer: Self-pay | Admitting: Internal Medicine

## 2024-02-13 DIAGNOSIS — H1013 Acute atopic conjunctivitis, bilateral: Secondary | ICD-10-CM

## 2024-02-13 NOTE — Telephone Encounter (Signed)
 Last script 02/07/22  Last visit 02/21/23  Please review and send if appropriate      Thank you

## 2024-02-19 ENCOUNTER — Other Ambulatory Visit: Payer: Self-pay

## 2024-02-19 ENCOUNTER — Ambulatory Visit

## 2024-02-19 DIAGNOSIS — J301 Allergic rhinitis due to pollen: Secondary | ICD-10-CM

## 2024-02-19 NOTE — Progress Notes (Signed)
 Pt feels well today.  Taking antihistamine daily.  Allergy injections given at time of appointment.  Pt observed for 30 minutes after injections.   Hydrocortisone cream applied as needed for itching.

## 2024-02-27 ENCOUNTER — Telehealth: Payer: Self-pay | Admitting: Allergy/Immunology/Rheumatology

## 2024-02-27 ENCOUNTER — Encounter: Payer: Self-pay | Admitting: Allergy/Immunology/Rheumatology

## 2024-02-27 ENCOUNTER — Ambulatory Visit

## 2024-02-27 NOTE — Telephone Encounter (Signed)
 Copied from CRM [id number]. Topic: Appointments - Reschedule Appointment  >> Feb 27, 2024  2:38 PM Areta Kuba wrote:  Theresa Bullock, Patient, is calling to schedule an Injection appointment.     Patient prefers Monday afternoon after 1:30    Please return their call at [phone].

## 2024-03-03 NOTE — Telephone Encounter (Signed)
 Theresa Bullock called and would like her serums transferred to the [facility] office as [place] is very limited days to schedule into.

## 2024-03-03 NOTE — Telephone Encounter (Signed)
 Called and left message for Theresa Bullock to contact us  to reschedule or schedule injection appointment.  Left direct number to call  [phone] opt 3 to schedule.

## 2024-03-04 NOTE — Telephone Encounter (Signed)
 Called pt and left message regarding scheduling injection appointment at [facility].

## 2024-03-12 ENCOUNTER — Ambulatory Visit

## 2024-03-17 ENCOUNTER — Ambulatory Visit

## 2024-03-17 ENCOUNTER — Other Ambulatory Visit: Payer: Self-pay

## 2024-03-17 DIAGNOSIS — J301 Allergic rhinitis due to pollen: Secondary | ICD-10-CM

## 2024-03-17 NOTE — Progress Notes (Signed)
 Pt feels well today  Pt has taken antihistamine  Immunotherapy injections given at time of appointment  Pt observed for 30 minutes and stable at discharge

## 2024-03-18 ENCOUNTER — Ambulatory Visit: Admitting: Pulmonary Disease

## 2024-03-24 ENCOUNTER — Ambulatory Visit

## 2024-03-31 ENCOUNTER — Other Ambulatory Visit: Payer: Self-pay

## 2024-03-31 ENCOUNTER — Ambulatory Visit

## 2024-03-31 DIAGNOSIS — J301 Allergic rhinitis due to pollen: Secondary | ICD-10-CM

## 2024-03-31 NOTE — Progress Notes (Addendum)
 Pt feels well today  Pt has taken antihistamine  Immunotherapy injections given at time of appointment  Hydrocortisone cream and ice declined  Pt did not wait 30 minutes prior to leaving.    Pt arrived 4 hours early for appointment today.  Writer asked patient in future to call prior to arriving that early for an appointment as  we may not be able to accommodate her otherwise.

## 2024-04-04 ENCOUNTER — Ambulatory Visit

## 2024-04-07 ENCOUNTER — Other Ambulatory Visit: Payer: Self-pay

## 2024-04-07 ENCOUNTER — Ambulatory Visit

## 2024-04-07 DIAGNOSIS — J301 Allergic rhinitis due to pollen: Secondary | ICD-10-CM

## 2024-04-07 NOTE — Progress Notes (Addendum)
 Pt feels well today  Pt has taken antihistamine  Immunotherapy injections given at time of appointment  Hydrocortisone cream and ice declined  Pt checked out and left immediately after injections were administered.

## 2024-04-14 ENCOUNTER — Ambulatory Visit

## 2024-04-14 ENCOUNTER — Other Ambulatory Visit: Payer: Self-pay

## 2024-04-14 DIAGNOSIS — J309 Allergic rhinitis, unspecified: Secondary | ICD-10-CM

## 2024-04-14 NOTE — Progress Notes (Signed)
 Pt feels well today.  Pt reports taking antihistamine prior to appointment.  Pt denies COVID vaccination in the last 24 hours or upcoming 24 hours.  Allergy  injections given at time of appointment.  Pt left immediately following injections despite advisement about 30 minute observation period.

## 2024-04-21 ENCOUNTER — Other Ambulatory Visit: Payer: Self-pay

## 2024-04-21 ENCOUNTER — Ambulatory Visit

## 2024-04-21 DIAGNOSIS — J301 Allergic rhinitis due to pollen: Secondary | ICD-10-CM

## 2024-04-21 NOTE — Progress Notes (Signed)
 Pt feels well today  Pt has taken antihistamine - premedicates with Allegra  Pt denies receiving the COVID vaccine within the past 24 hours  Immunotherapy injections given at time of appointment  Hydrocortisone cream offered - pt declined, uses Benadryl cream at home.   Reminded patient about our policy for 30 min wait - pt verbalized understanding and agreement.   Pt observed for 30 minutes and stable at discharge

## 2024-04-28 ENCOUNTER — Other Ambulatory Visit: Payer: Self-pay

## 2024-04-28 ENCOUNTER — Ambulatory Visit

## 2024-04-28 DIAGNOSIS — J301 Allergic rhinitis due to pollen: Secondary | ICD-10-CM

## 2024-04-28 NOTE — Progress Notes (Signed)
 Pt feels well todayPt has taken antihistaminePt denies receiving the COVID vaccine within the past 24 hoursImmunotherapy injections given at time of appointmentHydrocortisone cream offeredPt observed for 30 minutes and stable at discharge

## 2024-05-05 ENCOUNTER — Other Ambulatory Visit: Payer: Self-pay

## 2024-05-05 ENCOUNTER — Ambulatory Visit

## 2024-05-05 DIAGNOSIS — J309 Allergic rhinitis, unspecified: Secondary | ICD-10-CM

## 2024-05-05 NOTE — Progress Notes (Signed)
 Pt feels well today.  Pt reports taking antihistamine prior to appointment.  Pt denies COVID vaccination in the last 24 hours or upcoming 24 hours.  Allergy injections given at time of appointment.  Pt observed for 30 minutes after injections.

## 2024-05-07 ENCOUNTER — Ambulatory Visit: Payer: MEDICAID | Admitting: Family Medicine

## 2024-05-07 ENCOUNTER — Other Ambulatory Visit: Payer: Self-pay

## 2024-05-12 ENCOUNTER — Other Ambulatory Visit: Payer: Self-pay

## 2024-05-12 ENCOUNTER — Ambulatory Visit

## 2024-05-12 DIAGNOSIS — J301 Allergic rhinitis due to pollen: Secondary | ICD-10-CM

## 2024-05-12 NOTE — Progress Notes (Signed)
 Pt feels well todayPt has taken antihistaminePt denies receiving the COVID vaccine within the past 24 hoursImmunotherapy injections given at time of appointmentHydrocortisone cream offeredPt observed for 30 minutes and stable at discharge

## 2024-05-15 ENCOUNTER — Ambulatory Visit

## 2024-05-19 ENCOUNTER — Ambulatory Visit

## 2024-05-19 ENCOUNTER — Telehealth: Payer: Self-pay | Admitting: Gastroenterology

## 2024-05-19 NOTE — Telephone Encounter (Signed)
 Copied from CRM [id number]. Topic: Appointments - Reschedule Appointment>> May 19, 2024 11:58 AM Jamera Vanloan C wrote:Karlin, Patient, is calling to reschedule an Injection appointment for 05/19/24. Writer tried calling patient back phone was busy.  Please return their call at [phone] (home).

## 2024-05-19 NOTE — Telephone Encounter (Signed)
 Called and spoke with patient and rescheduled injection. All set.

## 2024-05-19 NOTE — Telephone Encounter (Signed)
 Copied from CRM #[id number]. Topic: Appointments - Reschedule Appointment>> May 19, 2024 12:06 PM [name] wrote:Theresa Bullock, Patient, is calling to reschedule an Injection appointment that she has scheduled [facility] the [facility] locationThe writer warm transferred the patient to the clerical staff to assist with rescheduling The patient can be reached at [phone]

## 2024-05-20 ENCOUNTER — Ambulatory Visit: Admitting: Pulmonary Disease

## 2024-05-22 ENCOUNTER — Other Ambulatory Visit: Payer: Self-pay

## 2024-05-22 ENCOUNTER — Ambulatory Visit

## 2024-05-22 DIAGNOSIS — J301 Allergic rhinitis due to pollen: Secondary | ICD-10-CM

## 2024-05-22 NOTE — Progress Notes (Signed)
 Pt feels well today  Pt has taken antihistamine  Immunotherapy injections given at time of appointment  Hydrocortisone cream applied  Pt observed for 30 minutes and stable at discharge

## 2024-05-30 ENCOUNTER — Other Ambulatory Visit: Payer: Self-pay

## 2024-05-30 ENCOUNTER — Ambulatory Visit

## 2024-05-30 DIAGNOSIS — J301 Allergic rhinitis due to pollen: Secondary | ICD-10-CM

## 2024-05-30 NOTE — Progress Notes (Signed)
Pt feels well today  Pt has taken antihistamine  Immunotherapy injections given at time of appointment  Hydrocortisone cream and ice declined  Pt observed for 30 minutes and stable at discharge

## 2024-06-02 ENCOUNTER — Ambulatory Visit

## 2024-06-06 ENCOUNTER — Ambulatory Visit

## 2024-06-06 ENCOUNTER — Other Ambulatory Visit: Payer: Self-pay

## 2024-06-06 DIAGNOSIS — J301 Allergic rhinitis due to pollen: Secondary | ICD-10-CM

## 2024-06-06 NOTE — Progress Notes (Signed)
 Pt feels well today.  Taking antihistamine daily.  Pt denies receiving vaccines within the past 24 hours  Immunotherapy injections given at time of appointment.  Pt observed for 30 minutes after injection. Patient tolerated injection well, without s/s of adverse reaction. Patient d/c'd home in stable condition.

## 2024-06-09 NOTE — Telephone Encounter (Signed)
 Called pt to leave message regarding scheduling injection appointment. Unable to leave message due to VM box being full.

## 2024-06-09 NOTE — Telephone Encounter (Unsigned)
 Copied from CRM #[id number]. Topic: Appointments - Schedule Appointment>> Jun 09, 2024  9:07 AM Triniti Gruetzmacher C wrote:Theresa Bullock, Patient, is calling to schedule an Injection appointment. Please return their call at [phone] (home).

## 2024-06-10 ENCOUNTER — Telehealth: Payer: Self-pay

## 2024-06-10 NOTE — Telephone Encounter (Signed)
 Caller called patient to schedule allergy  injection. Patient confirmed date and time Patient is all set Thank you Theresa Bullock

## 2024-06-12 ENCOUNTER — Ambulatory Visit

## 2024-06-12 ENCOUNTER — Other Ambulatory Visit: Payer: Self-pay

## 2024-06-12 DIAGNOSIS — J301 Allergic rhinitis due to pollen: Secondary | ICD-10-CM

## 2024-06-12 NOTE — Progress Notes (Signed)
 Pt feels well todayPt has taken antihistaminePt reports she had a reaction after last injection.  Pt reports about an hour to 2 hours after last injection she developed a rash which spread from her arm to her chest which was very itchy.  Pt reports it lasted for a few hours and then resolved on it's own.Writer spoke with Christina Sande and repeated last dose.  Reinforced for patient to notify us  if she does have another reaction.Immunotherapy injections given at time of appointmentHydrocortisone cream and ice declinedPt observed for 30 minutes and stable at discharge

## 2024-06-16 NOTE — Progress Notes (Signed)
 SubjectiveMs. Bullock is here for Lap Band follow up.    Had Lapband implanted 9 months ago. Has lost a total 40 lbs.Theresa Bullock has gained 10 lbs since the last visit, 1 ,pmtj ago.She presents today with complaints of hunger, weight gain and ability to eat larger portions. Denies fever, abdominal pain, heartburn, reflux, vomiting, dysphagia, diarrhea, and constipation. Patient is tolerating liquids and solids well. Vitals:  06/16/24 1308 BP: 124/76 Pulse: 97 SpO2: 98% Weight: 180 lb Height: 1.626 m (5' 4) Body mass index is 30.9 kg/m.Wt Readings from Last 3 Encounters: 06/16/24 180 lb 05/20/24 170 lb 3.2 oz 05/14/24 179 lb Past Medical History: Diagnosis Date . Allergic rhinitis  . Anemia  . Asthma   moderate persistent, hospitalized status ashmaticus (08/17/22-08/19/22) in [place] . Back pain 08/29/2017 . Chronic midline low back pain without sciatica  . Class 2 severe obesity due to excess calories with serious comorbidity and body mass index (BMI) of 37.0 to 37.9 in adult 01/03/2023 . Gallstones  . OSA (obstructive sleep apnea)   pt uses a CPAP at night . Prediabetes  Past Surgical History: Procedure Laterality Date . ABDOMINOPLASTY    2020 . CESAREAN SECTION, LOW TRANSVERSE    2x . LAP BAND N/A 09/13/2023  BANDING GASTRIC LAPAROSCOPIC performed by Wanda Sensing, MD at [facility] OR 10TH FLOOR Current Outpatient Medications Medication Instructions . EPINEPHrine  (EPIPEN ) 0.3 mg . fexofenadine (ALLEGRA) 180 mg . fluocinolone  (SYNALAR ) 0.01 % cream 1 Dose, 2 Times Daily . Fluocinolone -Shower Cap 0.01 % Oil 1 Dose, At Bedtime PRN . ipratropium-albuteroL  (DUONEB) 0.5 mg-3 mg(2.5 mg base)/3 mL nebulizer solution USE 3 ML VIA NEBULIZER FOUR TIMES DAILY AS NEEDED FOR WHEEZING . ketoconazole  (NIZORAL ) 2 % shampoo 1 Tablespoon, Daily PRN . levalbuterol  (XOPENEX ) 0.63 mg/3 mL nebulizer solution INHALE 3 MLS BY NEBULIZATION  EVERY 6 HOURS AS NEEDED FOR WHEEZING . loteprednol etabonate 0.5 % DrpG APPLY 1 DROP IN BOTH EYES THREE TIMES DAILY. Physical Exam:Vitals:  06/16/24 1308 BP: 124/76 Pulse: 97 Body mass index is 30.9 kg/m.General Appearance:  Alert, cooperative, no distress, appears stated age, good skin turgor, no jaundice Abdomen:   Soft, non-tender, non-distended, no masses, port palpable, of note, keloids over abdominal wall Assessment / Plan Patient is 9 months s/p Lapband surgery. Based on today's HPI, the patient requires a band adjustment in order to aid with hunger and satiety.   ICD-10-CM ICD-9-CM  1. LAP-BAND surgery status  Z98.84 V45.86   2. Class 2 obesity due to excess calories with body mass index (BMI) of 37.0 to 37.9 in adult, unspecified whether serious comorbidity present  E66.812 278.00 AMB REFERRAL TO PLASTIC SURGERY  E66.09 V85.37   Z68.37    3. Fitting and adjustment of gastric lap band  Z46.51 V53.51   Skin prepared with isopropyl alcohol 70%. Port accessed with [place] needle and saline removed. Patient tolerating liquids after loosening of band. Patient instructed to maintain a liquid diet for 2 days, followed by 2 days of pureed. Patient instructed to RTC if experiencing liquid intolerance, persistent vomiting or nocturnal symptoms. Patient verbalized understanding.  Band AdjustmentsFluid Found (cc): 8 ccFluid Added (cc): 1 cc Total Fluid (cc): 9 ccCounselled on importance of eating slowly in order to avoid regurgitation and subsequent esophagitis and dental caries. Demonstrated to patient how to eat slowly by using a timer. Reinforced behavioral changes, healthy food choices and exercise. Recommend patient to see RD and attend support groups to optimize outcomes.

## 2024-06-16 NOTE — Patient Instructions (Signed)
 Warren Dan, [facility] [address] [place]   [phone] Scot Lurie, [facility] [place], [facility] [address], [address] [place], [place]   [phone] Jackee Pelt, [facility] [address], [place]th [place], [place]   [phone]   Please sign up for MyChart to make appointments, obtain test results, and communicate with our clinicians and staff.Post Adjustment InstructionsBefore leaving the office you will be asked to drink a glass of water to ensure your adjustment is not too tight. 1st - 2nd day after adjustment: Liquid Diet (1st day is the day of your adjustment)3rd - 4th day after adjustment:  Pureed DietNote:  If you don't maintain a liquid diet for 2 days after your adjustment you are putting yourself at risk for band slippage and/or aspiration because of the potential un:CnfpuPmmpujuz your stomachHave difficulty tolerating your saliva         DehydrateExperience painIf you are experiencing any of the above symptoms, please notify us  immediately so that we may arrange for a readjustment (loosening).  Office [phone] Diet Includes: Broth, Protein shakes, Propel or Powerade, Diet Snapple, Thin soupsPureed Food: Applesauce, Low fat cottage cheese, Yogurt, Baby food, Thick soup (i.e. potato soup)Re-adjustment PolicyYou have 3 days to come back after your adjustment, if you are too tight at No charge/fee for one time only.  When you call to make an appointment within the 3 days, please be sure to inform the scheduler that the appointment is for a readjustment and when you were here last.

## 2024-06-19 ENCOUNTER — Ambulatory Visit

## 2024-06-19 ENCOUNTER — Other Ambulatory Visit: Payer: Self-pay

## 2024-06-19 DIAGNOSIS — J301 Allergic rhinitis due to pollen: Secondary | ICD-10-CM

## 2024-06-19 NOTE — Progress Notes (Signed)
 Pt feels well today  Pt has taken antihistamine  Immunotherapy injections given at time of appointment  Hydrocortisone cream and ice offered  Pt observed for 30 minutes and stable at discharge

## 2024-06-26 ENCOUNTER — Ambulatory Visit

## 2024-06-26 ENCOUNTER — Other Ambulatory Visit: Payer: Self-pay

## 2024-06-26 DIAGNOSIS — J301 Allergic rhinitis due to pollen: Secondary | ICD-10-CM

## 2024-06-26 NOTE — Progress Notes (Signed)
 Pt feels well todayPt has taken antihistaminePt denies receiving vaccines within the past 24 hoursImmunotherapy injections given at time of appointmentPt observed for 30 minutes and stable at discharge

## 2024-07-01 ENCOUNTER — Other Ambulatory Visit: Payer: Self-pay

## 2024-07-02 ENCOUNTER — Ambulatory Visit: Payer: Medicaid Other | Admitting: Pulmonology

## 2024-07-02 ENCOUNTER — Other Ambulatory Visit: Payer: Self-pay | Admitting: Pulmonology

## 2024-07-02 ENCOUNTER — Ambulatory Visit

## 2024-07-02 VITALS — HR 73 | Resp 16 | Wt 185.0 lb

## 2024-07-02 DIAGNOSIS — J4540 Moderate persistent asthma, uncomplicated: Secondary | ICD-10-CM

## 2024-07-02 DIAGNOSIS — J301 Allergic rhinitis due to pollen: Secondary | ICD-10-CM

## 2024-07-02 MED ORDER — BUDESONIDE-FORMOTEROL FUMARATE 160-4.5 MCG/ACT IN AERO *I*
1.0000 | INHALATION_SPRAY | Freq: Two times a day (BID) | RESPIRATORY_TRACT | 6 refills | Status: DC
Start: 2024-07-02 — End: 2024-08-20

## 2024-07-02 NOTE — Progress Notes (Signed)
 [facility]  & Pulmonary CareOutpatient Follow-up VisitDear Dr. Manus FERNS had the pleasure of seeing Theresa Bullock in pulmonary follow-up today [facility] the [facility].History of Present IllnessThe patient is a 43 year old female who presents for evaluation of asthma.She reports that her asthma has been well-managed for over a year, with no need for inhaler use in the past 12 months. She has not experienced shortness of breath or wheezing and has not required prednisone  since her last asthma episode more than a year ago. She also does not take montelukast . She uses Symbicort  as needed and has not refilled it since 03/2023, with the last dose taken in 11/2023. She also has an albuterol  inhaler and nebulizer but has not needed to refill them recently. She reports no current coughing or wheezing. She notes that her asthma episodes are typically triggered by fever, chills, or flu.She plans to travel to [place]  next week for 3 months. She was hospitalized in 09/2022 due to influenza, which initially presented with fever and chills before escalating. She has received both the influenza and COVID-19 vaccines this year.On 12/18/2023, she experienced shortness of breath and increased wheezing, leading to an urgent care visit where she was diagnosed with pneumonia. This infection triggered her asthma. Since the resolution of the pneumonia, her wheezing and shortness of breath have improved. She has not had any respiratory issues in the past few months and has not needed to seek emergency care. Pulse 73   Resp 16   Wt 83.9 kg (185 lb)   SpO2 100% Comment: Room Air Resting  BMI 30.55 kg/m Physical ExamGeneral Appearance: Patient is very pleasant, in no acute distress.HEENT: Nasal mucosa is normal. Oropharynx is clear.Respiratory: Lungs are clear to auscultation.Cardiovascular: Heart sounds are regular.Extremities: No edema is noted in the  lower extremities. Assessment & PlanModerate persistent asthma.Despite minimal daily symptoms, she experiences 1 to 2 exacerbations annually, often necessitating oral steroids, typically in response to respiratory infections. Previous pulmonary function tests have shown normal spirometry but elevated FeNO, indicating type 2 airway inflammation. She is currently on Symbicort  anti-inflammatory reliever therapy only. A step-up to step 3 GINA with Symbicort  1 inhalation twice daily and additional inhalations as needed MART therapy was suggested. A new prescription for Symbicort  1 puff twice daily with additional puffs as needed has been sent in. Follow-upThe patient will follow up in 1 year with updated spirometry and exhaled nitric oxide  levels, or sooner if necessary. Thank you for allowing me to remain involved in the care of your patient. Please don't hesitate to contact me with any questions or concerns. Sincerely, Derinda Roux, MDProfessor/Pulmonary & Critical Care MedicineThis note has been dictated using Speech recognition software.  Although it has been extensively proofed, please excuse any errors that may have been missed.I personally spent 30 minutes on the calendar day of the encounter, including pre and post visit work.

## 2024-07-02 NOTE — Progress Notes (Signed)
 Pt feels well todayPt has taken antihistaminePt denies receiving the COVID vaccine within the past 24 hoursImmunotherapy injections given at time of appointmentHydrocortisone cream offeredPt observed for 30 minutes and stable at discharge

## 2024-07-20 ENCOUNTER — Other Ambulatory Visit: Payer: Self-pay

## 2024-07-21 ENCOUNTER — Telehealth: Payer: Self-pay

## 2024-07-21 ENCOUNTER — Ambulatory Visit

## 2024-07-21 ENCOUNTER — Other Ambulatory Visit
Admission: RE | Admit: 2024-07-21 | Discharge: 2024-07-21 | Disposition: A | Discharge status: Home / Self Care | Source: Ambulatory Visit

## 2024-07-21 ENCOUNTER — Ambulatory Visit: Payer: MEDICAID

## 2024-07-21 VITALS — BP 125/85 | HR 68 | Temp 97.3°F | Ht 64.0 in | Wt 175.0 lb

## 2024-07-21 DIAGNOSIS — R7303 Prediabetes: Secondary | ICD-10-CM | POA: Insufficient documentation

## 2024-07-21 DIAGNOSIS — J4540 Moderate persistent asthma, uncomplicated: Secondary | ICD-10-CM

## 2024-07-21 DIAGNOSIS — L408 Other psoriasis: Secondary | ICD-10-CM

## 2024-07-21 DIAGNOSIS — J301 Allergic rhinitis due to pollen: Secondary | ICD-10-CM

## 2024-07-21 DIAGNOSIS — E66811 Obesity, class 1: Secondary | ICD-10-CM

## 2024-07-21 LAB — COMPREHENSIVE METABOLIC PANEL
ALT: 35 U/L (ref 0–35)
AST: 25 U/L (ref 0–35)
Albumin: 4.3 g/dL (ref 3.5–5.2)
Alk Phos: 49 U/L (ref 35–105)
Anion Gap: 13 (ref 7–16)
Bilirubin,Total: 0.6 mg/dL (ref 0.0–1.2)
CO2: 22 mmol/L (ref 20–28)
Calcium: 9.4 mg/dL (ref 8.8–10.2)
Chloride: 107 mmol/L (ref 96–108)
Creatinine: 0.72 mg/dL (ref 0.51–0.95)
Glucose: 80 mg/dL (ref 60–99)
Lab: 8 mg/dL (ref 6–20)
Potassium: 4.1 mmol/L (ref 3.3–5.1)
Sodium: 142 mmol/L (ref 133–145)
Total Protein: 6.6 g/dL (ref 6.3–7.7)
eGFR BY CREAT: 105

## 2024-07-21 LAB — CBC
Hematocrit: 40 % (ref 34–49)
Hemoglobin: 13.3 g/dL (ref 11.2–16.0)
MCV: 86 fL (ref 75–100)
Platelets: 276 THOU/uL (ref 150–450)
RBC: 4.7 MIL/uL (ref 4.0–5.5)
RDW: 14.1 % (ref 0.0–15.0)
WBC: 5.6 THOU/uL (ref 3.5–11.0)

## 2024-07-21 LAB — TSH WITH REFLEX TO FT4: TSH: 0.78 u[IU]/mL (ref 0.27–4.20)

## 2024-07-21 LAB — LIPID PANEL
Chol/HDL Ratio: 3
Cholesterol: 209 mg/dL — AB
HDL: 70 mg/dL — ABNORMAL HIGH (ref 40–60)
LDL Calculated: 126 mg/dL
Non HDL Cholesterol: 139 mg/dL
Triglycerides: 73 mg/dL

## 2024-07-21 MED ORDER — KETOCONAZOLE 2 % EX SHAM *I*
MEDICATED_SHAMPOO | CUTANEOUS | 0 refills | Status: AC
Start: 2024-07-21 — End: ?

## 2024-07-21 NOTE — Progress Notes (Unsigned)
 Patient has been reminded numerous times about observation policy, left early without checking in with staff again today (fourth occurrence).
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
## 2024-07-21 NOTE — Progress Notes (Addendum)
 Pt feels well todayPt has taken antihistaminePt denies receiving vaccines within the past 24 hoursImmunotherapy injections given at time of appointmentPt left after 20 minute wait without checking in with nursing staff. Provider notified.

## 2024-07-21 NOTE — Progress Notes (Signed)
 OFFICE VISIT DOCUMENTATIONJillian Bullock is a 43 y.o. female presenting with:Chief Complaint Patient presents with  New Patient Visit   Patient is here to establish care. Patient is also wants to discuss ozempic  medication. Visit conducted in EnglishInterim HPI and treatment narrative:Had bariatric surgery dec 2024 lap band surgery  lost 40 lbs. History of mild osa- mild OSA diagnosed with an HST 11/08/2022 showing an AHI of 9.6 events per hour Annually when she has flu she gets a major asthma attack.  Plan to increase symbicort  at first signHad mammogram this past year.Advised colonoscopy in a year.Mother with breast cancerMaternal aunt breast cancer2 1st degree cousins breast cancer. Maternal uncle with prostate cancer metastasized.Maternal grandfather with colon cancer. Unsure his age when he was diagnosed. ROSPrior data review: Prediabetic in past due for repeat labsLab Results Component Value Date  HA1C 6.2 (H) 02/21/2023 BP Readings from Last 3 Encounters: 07/21/24 125/85 12/21/23 122/88 12/11/23 118/81 Wt Readings from Last 3 Encounters: 07/21/24 79.4 kg (175 lb) 07/02/24 83.9 kg (185 lb) 12/21/23 93.9 kg (207 lb) [x]  I reviewed chronic medical conditions and their associated care plan and monitoring labwork for Jaimi[x]  Medications were refilled for chronic conditions today[x]  Lab work was reviewed and monitoring lab work interval- see lab order details Patient was provided risk factor reduction counseling on the following topics:[]  Healthy diet  []  Exercise []  Preventing ASCVD []  Smoking cessation [x] EYE exam [x] Dental Cleanings, flossing and brushing [x]  Recommended vaccines[]  Bone density screening []  Cancer screening[x]  Drug and or alcohol use [x]  STI screening [x]  Family planning [] PrEP [] DOXYPEP[]  NARCAN  [] NEVER use alone [] harm reduction supplies Exam:Vitals:  07/21/24 1514 BP: 125/85  Pulse: 68 Temp: 36.3 C (97.3 F) TempSrc: Infrared SpO2: 98% Weight: 79.4 kg (175 lb) Height: 1.626 m (5' 4) Physical ExamHeart: RRR no murmurs s1 s2Lung: clear to auscultation bilaterallyAssessments, Goals and Care Plan: Goals    HEMOGLOBIN A1C < 6.5    LDL CALC < 100   1. Pre-diabetes- Comprehensive metabolic panel; Future- Lipid Panel (Reflex to Direct  LDL if Triglycerides more than 400); Future- CBC; Future- TSH with Reflex to FT4; Future- Hemoglobin A1c; Future- Vitamin D 1,25 dihydroxy; Future2. Class 1 obesity3. Moderate persistent asthma, unspecified whether complicated4. Sebopsoriasis- ketoconazole  (NIZORAL ) 2 % shampoo; Apply topically once a week. to the following areas: scalp  Dispense: 120 mL; Refill: 0Problem List Items Addressed This Visit   Class 1 obesity  Pre-diabetes - Primary  Relevant Orders  Comprehensive metabolic panel  Lipid Panel (Reflex to Direct  LDL if Triglycerides more than 400)  CBC  TSH with Reflex to FT4  Hemoglobin A1c  Vitamin D 1,25 dihydroxy Other Visit Diagnoses     Moderate persistent asthma, unspecified whether complicated        Sebopsoriasis      Relevant Medications  ketoconazole  (NIZORAL ) 2 % shampoo  No follow-ups on file. See patient instructionsCurrent Outpatient Medications Medication Sig  budesonide -formoterol  (SYMBICORT , BREYNA ) 160-4.5 MCG/ACT inhaler Inhale 1 puff into the lungs 2 times daily. Use 1 inhalation as needed for wheezing or chest tightness. Repeat in 1-2 minutes as needed. Contact Dr. Khurana if you are needing to use more than 6 puffs in one day. Shake well before each use.  fexofenadine (ALLEGRA) 180 mg tablet Take 1 tablet (180 mg total) by mouth daily.  fluocinolone  0.01% scalp (DERMA-SMOOTHE /FS) 0.01 % OIL external oil Apply topically nightly as needed  to the following areas: scaklp (Patient taking differently:  Apply topically nightly as needed. to the following areas: scalp)  ketoconazole  (NIZORAL ) 2 % shampoo Apply topically once a week. to the following areas: scalp  albuterol  HFA (PROVENTIL , VENTOLIN , PROAIR  HFA) 108 (90 Base) MCG/ACT inhaler Inhale 1-2 puffs into the lungs every 6 hours as needed for Wheezing  Shake well before each use. Patient Active Problem List Diagnosis Code  Chronic midline low back pain without sciatica M54.50, G89.29  Class 1 obesity E66.811  Pre-diabetes R73.03  OSA (obstructive sleep apnea) G47.33  Gastroesophageal reflux disease without esophagitis K21.9  Moderate persistent asthma without complication J45.40 Tobacco Use History[1]Antwane Grose, MDElectronically signed by Beuford Mount MD on 07/21/2024 at 3:54 PM [1] Social HistoryTobacco Use Smoking Status Never Smokeless Tobacco Never

## 2024-07-22 LAB — HEMOGLOBIN A1C: Hemoglobin A1C: 5.8 % — ABNORMAL HIGH (ref <=5.6)

## 2024-07-23 LAB — VITAMIN D 1,25 DIHYDROXY: Vit D, 1,25-Dihydroxy: 50.3 pg/mL (ref 19.9–79.3)
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
## 2024-07-24 NOTE — Progress Notes (Signed)
 Called pt and left detailed message regarding scheduling follow up and injection appointment.

## 2024-07-24 NOTE — Progress Notes (Signed)
 Please contact pt to schedule a follow up appointment with Ivory Broad, NP. Pt needs to have an appointment on the schedule before extract refills are formulated.    Thank you!    Cornelia Copa, RN

## 2024-08-12 ENCOUNTER — Ambulatory Visit: Admitting: Pulmonary Disease

## 2024-08-20 ENCOUNTER — Other Ambulatory Visit: Payer: Self-pay | Admitting: Pulmonology

## 2024-09-03 ENCOUNTER — Telehealth: Payer: Self-pay | Admitting: Gastroenterology

## 2024-09-03 NOTE — Telephone Encounter (Signed)
 Copied from CRM #[id number]. Topic: Appointments - Schedule Appointment>> Sep 03, 2024 10:19 AM [name] wrote:Theresa Bullock , Patient, is calling to schedule an Injection appointment. Patient states it's been more than three weeks since her last injection. Patient would like to verify how often she should have injections.Please return their call at [phone] to discuss

## 2024-09-03 NOTE — Telephone Encounter (Signed)
 Called pt and scheduled Injection/ follow up with Christina,Sande same day. This is all set.

## 2024-09-04 ENCOUNTER — Ambulatory Visit: Attending: Allergy/Immunology/Rheumatology | Admitting: Allergy/Immunology/Rheumatology

## 2024-09-04 ENCOUNTER — Encounter: Payer: Self-pay | Admitting: Allergy/Immunology/Rheumatology

## 2024-09-04 ENCOUNTER — Other Ambulatory Visit: Payer: Self-pay

## 2024-09-04 ENCOUNTER — Ambulatory Visit

## 2024-09-04 VITALS — BP 138/72 | HR 73 | Temp 98.1°F | Ht 64.0 in | Wt 172.0 lb

## 2024-09-04 DIAGNOSIS — J309 Allergic rhinitis, unspecified: Secondary | ICD-10-CM

## 2024-09-04 DIAGNOSIS — J45909 Unspecified asthma, uncomplicated: Secondary | ICD-10-CM

## 2024-09-04 DIAGNOSIS — Z91018 Allergy to other foods: Secondary | ICD-10-CM

## 2024-09-04 DIAGNOSIS — J301 Allergic rhinitis due to pollen: Secondary | ICD-10-CM

## 2024-09-04 NOTE — Patient Instructions (Signed)
 Continue carrying 2 Epi Pens together at all times

## 2024-09-04 NOTE — Progress Notes (Signed)
 Allergy  Follow-Up NotePRIMARY CARE PHYSICIAN: Theresa Martinis, MDHPI: Theresa Bullock is a 43 y.o. female who is here for follow-up of allergic rhinitis.  She was last seen at our office in April 2024 by Theresa Plate, NP. She is also followed by Dr. Helayne with pulmonology, and was last seen in October 2025. History of Present Illness Allergic Rhinitis:She endorses watery/itchy eyes, runny nose, sneezing, post nasal drip. Sometimes a headache, but no concerns todayShe is receiving immunotherapy injections for trees, grass, weeds, dog, and dust mites. She reached maintenance in October 2025In September 2025, there was one occurrence after her immunotherapy injection that she felt itchy all over and had a rash on her chest. There was not swelling of her tongue/lips/mouth/throat or difficulty breathing. She took Allegra 180mg  which helped.Overall feels well, but hard to tell if there is a difference in her symptoms since starting shots because she was recently working in [employer] is only taking 180mg  Allegra before immunotherapy injections and using olopatadine  eye drops as needed which helps.Food allergies:She is very careful about what she is eating, and has had no accidental exposuresShe carries her 2 Epi Pens with her at all timesAsthma:She feels like her asthma is well controlled. She denies wheezing, and shortness of breath.She is currently using budesonide -formoterol  (SYMBICORT , BREYNA ) 160-4.5 MCG/ACT inhaler 1 puff twice dailyShe is not utilizing albuterol  as needed for a rescue inhalerI have reviewed, updated and confirmed the details of the past medical history, surgical history, immunization history, family history, and environmental history as previously detailed in the patient's chart. CURRENT MEDICATIONS: Current Outpatient Medications Medication Sig  budesonide -formoterol  (SYMBICORT , BREYNA ) 160-4.5 MCG/ACT inhaler  SHAKE WELL INHALE 1PUFF 2XDAY.USE 1 PUFF AS NEEDED FOR WHEEZING/CHEST TIGHTNESS.REPEAT IN 1-2MINUTES AS NEEDED.CALL IF NEED TO USE>6 PUFFS/DAY  ketoconazole  (NIZORAL ) 2 % shampoo Apply topically once a week. to the following areas: scalp  fexofenadine (ALLEGRA) 180 mg tablet Take 1 tablet (180 mg total) by mouth daily.  albuterol  HFA (PROVENTIL , VENTOLIN , PROAIR  HFA) 108 (90 Base) MCG/ACT inhaler Inhale 1-2 puffs into the lungs every 6 hours as needed for Wheezing  Shake well before each use.  fluocinolone  0.01% scalp (DERMA-SMOOTHE /FS) 0.01 % OIL external oil Apply topically nightly as needed  to the following areas: scaklp (Patient taking differently: Apply topically nightly as needed. to the following areas: scalp)  ALLERGIES: Allergies[1]PHYSICAL EXAMINATION:BP 138/72 (BP Location: Left arm, Patient Position: Sitting, Cuff Size: adult)   Pulse 73   Temp 36.7 C (98.1 F) (Temporal)   Ht 1.626 m (5' 4)   Wt 78 kg (172 lb)   SpO2 100%   BMI 29.52 kg/m Constitutional: Alert, appears well, and in no acute distressEyes: No conjunctival erythema, no eyelid or periorbital edemaEars: Canals normal, TM's visible bilaterally and normalNose: No significant discharge, nasal mucosa and turbinates are normalOropharynx: Normal mucosa, no oral lesions, posterior oropharynx without edema or erythemaNeck: Soft, thyroid  not palpable, no lymphadenopathyCardiovascular: Normal S1-S2, no murmur or gallop or rub, regular rate and rhythm. No peripheral edema. Respiratory: Clear to auscultation bilaterally, good air movement, breathing comfortably on room air Skin: No visible rash. Extremities without clubbing or cyanosis.Neurologic: Moving all extremities, mental status normal for agePsychiatric: Normal affect for ageIMPRESSION & PLAN:Theresa Bullock is a 43 y.o. female presenting for follow up of allergic rhinitis, food allergies and asthma.Assessment & PlanAllergic  Rhinitis:Continue with immunotherapy injections for another 3-5 years.Continue taking 180mg  Allegra before immunotherapy and using olopatadine  eye drops as neededFood allergies:Continue carrying 2 EpiPens with you at all times.Asthma:Continue using budesonide -formoterol  (SYMBICORT , BREYNA )  160-4.5 MCG/ACT inhaler 1 puff twice dailyFollow up: 1 yearThank you for allowing us  to participate in the care of your patient. Please do not hesitate to contact us  with any questions or concerns. Theresa Bullock, FNPAllergy & ImmunologyUniversity of Movico Medical [address], [address], [place] [address]Clinic phone: [phone] [1] AllergiesAllergen Reactions  Shellfish Allergy  Anaphylaxis and Other (See Comments)   Tongue swelling  Shellfish Protein-Containing Drug Products Anaphylaxis   Crab only. Tolerates shrimp, lobster, and crawfish.  Cinnamon Other (See Comments) and Itching   Tongue irritation  Crab (Diagnostic) Anaphylaxis  Crustacean [Other] Anaphylaxis   Received Name: Crustacean

## 2024-09-04 NOTE — Progress Notes (Signed)
 Pt feels well todayPt has taken antihistaminePt denies receiving vaccines within the past 24 hoursImmunotherapy injections given at time of appointmentHydrocortisone cream appliedPt observed for 30 minutes and stable at discharge

## 2024-09-08 ENCOUNTER — Encounter: Payer: Self-pay | Admitting: Gastroenterology

## 2024-09-08 NOTE — Telephone Encounter (Signed)
 Error

## 2024-09-08 NOTE — Progress Notes (Signed)
 Immunotherapy Refill Orders - Provider SectionRefills formulated per order referenced in Date of Initial Order. Special instructions for Penicillium Mix: -- Does not contain Penicillium MixChoose refill dosing option: Split Dose -- Give 0.25 mL of 1:1 (dilution) & observe patient for 30 minutes. If no reaction, give 0.25 mL of 1:1 (dilution) & observe for additional 30 minutes. If no reaction after second 30 minute observation period, return to maintenance dose or resume build per order.   If patient cannot stay for 2 doses same day, then: notify providerNumber of days since last injection Dosing guide Return appointment 40 days or less Repeat previous dose 28 days OR normal maintenance schedule 41-48 Decrease by 1 dose increment 1 week then maintenance 49-55 Decrease by 2 dose increments Weekly until reach maintenance Greater than 55 days Check with patient's allergist Per provider order Injections are given subcutaneously, using a tuberculin syringe with a 26 or 27 gauge needle. Injections should be given in the upper arm, with sites being rotated.Patients should be instructed to wait at least 30 minutes in the office after any allergy injection.Local Reactions:Patient may continue to build on immunotherapy per protocol as tolerated with large local reactions (swelling and localized itching > 10 cm) as long as no systemic symptoms are present. If patient is finding local reaction bothersome contact provider for next dose and potential premedication orders.Please consult provider if any systemic reactions (asthma, generalized pruritus, or hives) occur.      Immunotherapy injection may be held as Data Processing Manager if patient is ill or has any symptoms suggestive of illness (shortness of breath, wheezing, fever) or for peak flow <80% of baseline.  Immunotherapy will not be given if prescribed antihistamine has not been taken within 24 hours.  If patient has forgotten  their antihistamine as ordered prior to IT, cetirizine  10 mg may be given one time only and patient will have to wait 20-30 minutes prior to injectionEmergency/ one time medications for reactions:Hydrocortisone  cream 1% topically to affected areaBenadryl 25 mg PO PRN itching (Adult or children > 25 kg)Benadryl  liquid (12.5mg /45mL) 0.5mg /kg PO PRN itching (Pediatric<25kg) round to nearest mLCetirizine 10 mg PO PRN itching (adult or child over 30kg)Cetirizine  liquid (5mg /29mL) 5 mg PO PRN (children Epinephrine  0.3 mL IM PRN for anaphylaxis (Adult and children >30kg)Epinephrine  0.15 mL IM PRN for anaphylaxis (Pediatric Albuterol  HFA 2 puffs PRN for wheezingAlbuterol 2.5 mg in 3 mL saline via nebulizer PRN for wheezingPeak Flow  (yes/no): no

## 2024-09-08 NOTE — Progress Notes (Signed)
 Immunotherapy Refill Orders - Nurse SectionLocation (office where shots are received): [place]Date [place] Order: 11[place]15/2024Allergen # 1: Tree / Grass / WeedDilution: 1:1Cap color: redAllergen # 2: Dog / Dust MiteDilution: 1:1Cap color: redCurrent Expiration Date: 12/20/2025Next Injection Date: 1/8/2026Last Provider Visit: 12/11/2025Date of Last Dose: 12/11/2025Last Dose Received: red 1:1 0.5 mLAt Maintenance: YesDate Maintenance Reached: 10/2025Maintenance Dose: red 1:1 0.5 mLMaintenance Frequency: q28 daysElm: Original order does not contain elmDog: Extract will contain Hollister Stier [facility] Dog per previous orderPenicillium Mix: Original order does not contain penicillium mixThank you!Greig Harbour, RN

## 2024-09-09 ENCOUNTER — Ambulatory Visit

## 2024-09-09 DIAGNOSIS — J301 Allergic rhinitis due to pollen: Secondary | ICD-10-CM

## 2024-09-09 NOTE — Progress Notes (Signed)
 Extract refills were made today according to provider's orders. Extracts contain:    Seasonal Allergens:  [x]  Trees (spring)  [x]  Grasses (summer)  [x]  Weeds (autumn)    Perennial Allergens:  [x]  Dander  []  Carolin Coy  []  Mold  [x]  Dust Mite     Number of extract vials:   2  Total # of doses:    20    Cecile Sheerer, RN

## 2024-10-02 ENCOUNTER — Other Ambulatory Visit: Payer: Self-pay

## 2024-10-02 ENCOUNTER — Ambulatory Visit

## 2024-10-02 DIAGNOSIS — J301 Allergic rhinitis due to pollen: Secondary | ICD-10-CM

## 2024-10-02 NOTE — Progress Notes (Signed)
 Pt feels well todayPt has taken antihistaminePt denies receiving vaccines within the past 24 hoursImmunotherapy injections given at time of appointment. Split dosing due to new serums.Hydrocortisone cream appliedPt observed for 30 minutes and stable at discharge

## 2024-10-17 ENCOUNTER — Other Ambulatory Visit
Admission: RE | Admit: 2024-10-17 | Discharge: 2024-10-17 | Disposition: A | Discharge status: Home / Self Care | Source: Ambulatory Visit

## 2024-10-17 DIAGNOSIS — Z0189 Encounter for other specified special examinations: Secondary | ICD-10-CM | POA: Insufficient documentation

## 2024-10-17 LAB — LIPID PANEL
Chol/HDL Ratio: 3.2
Cholesterol: 207 mg/dL — AB
HDL: 65 mg/dL — ABNORMAL HIGH (ref 40–60)
LDL Calculated: 127 mg/dL
Non HDL Cholesterol: 142 mg/dL
Triglycerides: 82 mg/dL

## 2024-10-17 LAB — COMPREHENSIVE METABOLIC PANEL
ALT: 37 U/L — ABNORMAL HIGH (ref 0–35)
AST: 24 U/L (ref 0–35)
Albumin: 4.4 g/dL (ref 3.5–5.2)
Alk Phos: 42 U/L (ref 35–105)
Anion Gap: 10 (ref 7–16)
Bilirubin,Total: 0.6 mg/dL (ref 0.0–1.2)
CO2: 25 mmol/L (ref 20–28)
Calcium: 9.6 mg/dL (ref 8.8–10.2)
Chloride: 108 mmol/L (ref 96–108)
Creatinine: 0.78 mg/dL (ref 0.51–0.95)
Glucose: 82 mg/dL (ref 60–99)
Lab: 8 mg/dL (ref 6–20)
Potassium: 4.4 mmol/L (ref 3.3–5.1)
Sodium: 143 mmol/L (ref 133–145)
Total Protein: 6.8 g/dL (ref 6.3–7.7)
eGFR BY CREAT: 95

## 2024-10-17 LAB — TSH: TSH: 0.48 u[IU]/mL (ref 0.27–4.20)

## 2024-10-19 LAB — HEMOGLOBIN A1C: Hemoglobin A1C: 5.6 % (ref <=5.6)

## 2024-11-04 ENCOUNTER — Ambulatory Visit

## 2025-07-06 ENCOUNTER — Encounter

## 2025-07-06 ENCOUNTER — Ambulatory Visit: Admitting: Pulmonology

## 2025-09-03 ENCOUNTER — Ambulatory Visit: Admitting: Allergy/Immunology/Rheumatology
